# Patient Record
Sex: Female | Born: 1975
Health system: Southern US, Community
[De-identification: ages and names within clinical notes are randomized; demographics above are authoritative.]

## PROBLEM LIST (undated history)

## (undated) DIAGNOSIS — J302 Other seasonal allergic rhinitis: Secondary | ICD-10-CM

## (undated) DIAGNOSIS — F32A Depression, unspecified: Secondary | ICD-10-CM

## (undated) DIAGNOSIS — K3580 Unspecified acute appendicitis: Secondary | ICD-10-CM

## (undated) DIAGNOSIS — K819 Cholecystitis, unspecified: Secondary | ICD-10-CM

## (undated) DIAGNOSIS — D649 Anemia, unspecified: Secondary | ICD-10-CM

## (undated) DIAGNOSIS — C801 Malignant (primary) neoplasm, unspecified: Secondary | ICD-10-CM

## (undated) DIAGNOSIS — T4145XA Adverse effect of unspecified anesthetic, initial encounter: Secondary | ICD-10-CM

## (undated) DIAGNOSIS — R51 Headache: Secondary | ICD-10-CM

## (undated) DIAGNOSIS — R928 Other abnormal and inconclusive findings on diagnostic imaging of breast: Secondary | ICD-10-CM

## (undated) DIAGNOSIS — R519 Headache, unspecified: Secondary | ICD-10-CM

## (undated) DIAGNOSIS — J189 Pneumonia, unspecified organism: Secondary | ICD-10-CM

## (undated) DIAGNOSIS — T8859XA Other complications of anesthesia, initial encounter: Secondary | ICD-10-CM

## (undated) DIAGNOSIS — H539 Unspecified visual disturbance: Secondary | ICD-10-CM

## (undated) DIAGNOSIS — Z9889 Other specified postprocedural states: Secondary | ICD-10-CM

## (undated) DIAGNOSIS — M84376A Stress fracture, unspecified foot, initial encounter for fracture: Secondary | ICD-10-CM

## (undated) DIAGNOSIS — F419 Anxiety disorder, unspecified: Secondary | ICD-10-CM

## (undated) DIAGNOSIS — R112 Nausea with vomiting, unspecified: Secondary | ICD-10-CM

## (undated) HISTORY — DX: Other abnormal and inconclusive findings on diagnostic imaging of breast: R92.8

## (undated) HISTORY — DX: Cholecystitis, unspecified: K81.9

## (undated) HISTORY — DX: Unspecified acute appendicitis: K35.80

## (undated) HISTORY — DX: Unspecified visual disturbance: H53.9

## (undated) HISTORY — DX: Anxiety disorder, unspecified: F41.9

## (undated) HISTORY — DX: Anemia, unspecified: D64.9

## (undated) HISTORY — DX: Depression, unspecified: F32.A

## (undated) HISTORY — PX: CHOLECYSTECTOMY: SHX55

## (undated) HISTORY — PX: APPENDECTOMY: SHX54

## (undated) HISTORY — DX: Stress fracture, unspecified foot, initial encounter for fracture: M84.376A

## (undated) HISTORY — PX: EYE SURGERY: SHX253

## (undated) HISTORY — PX: WISDOM TOOTH EXTRACTION: SHX21

---

## 1998-11-15 HISTORY — PX: OTHER SURGICAL HISTORY: SHX169

## 2002-02-07 ENCOUNTER — Emergency Department (HOSPITAL_COMMUNITY): Admission: EM | Admit: 2002-02-07 | Discharge: 2002-02-07 | Payer: Self-pay | Admitting: Emergency Medicine

## 2002-07-20 ENCOUNTER — Other Ambulatory Visit: Admission: RE | Admit: 2002-07-20 | Discharge: 2002-07-20 | Payer: Self-pay | Admitting: Obstetrics and Gynecology

## 2002-09-18 ENCOUNTER — Encounter: Payer: Self-pay | Admitting: Obstetrics and Gynecology

## 2002-09-18 ENCOUNTER — Ambulatory Visit (HOSPITAL_COMMUNITY): Admission: RE | Admit: 2002-09-18 | Discharge: 2002-09-18 | Payer: Self-pay | Admitting: Obstetrics and Gynecology

## 2002-11-13 ENCOUNTER — Inpatient Hospital Stay (HOSPITAL_COMMUNITY): Admission: AD | Admit: 2002-11-13 | Discharge: 2002-11-13 | Payer: Self-pay | Admitting: Obstetrics and Gynecology

## 2003-01-31 ENCOUNTER — Observation Stay (HOSPITAL_COMMUNITY): Admission: AD | Admit: 2003-01-31 | Discharge: 2003-01-31 | Payer: Self-pay | Admitting: Obstetrics and Gynecology

## 2003-02-01 ENCOUNTER — Inpatient Hospital Stay (HOSPITAL_COMMUNITY): Admission: AD | Admit: 2003-02-01 | Discharge: 2003-02-04 | Payer: Self-pay | Admitting: Obstetrics and Gynecology

## 2003-02-05 ENCOUNTER — Encounter: Admission: RE | Admit: 2003-02-05 | Discharge: 2003-03-07 | Payer: Self-pay | Admitting: Obstetrics and Gynecology

## 2003-02-26 ENCOUNTER — Encounter: Payer: Self-pay | Admitting: Emergency Medicine

## 2003-02-26 ENCOUNTER — Emergency Department (HOSPITAL_COMMUNITY): Admission: EM | Admit: 2003-02-26 | Discharge: 2003-02-26 | Payer: Self-pay | Admitting: Emergency Medicine

## 2003-07-23 ENCOUNTER — Encounter: Payer: Self-pay | Admitting: Emergency Medicine

## 2003-07-23 ENCOUNTER — Emergency Department (HOSPITAL_COMMUNITY): Admission: EM | Admit: 2003-07-23 | Discharge: 2003-07-23 | Payer: Self-pay | Admitting: Emergency Medicine

## 2003-08-01 ENCOUNTER — Encounter: Payer: Self-pay | Admitting: Orthopedic Surgery

## 2003-08-01 ENCOUNTER — Ambulatory Visit (HOSPITAL_COMMUNITY): Admission: RE | Admit: 2003-08-01 | Discharge: 2003-08-01 | Payer: Self-pay | Admitting: Orthopedic Surgery

## 2003-08-29 ENCOUNTER — Ambulatory Visit (HOSPITAL_BASED_OUTPATIENT_CLINIC_OR_DEPARTMENT_OTHER): Admission: RE | Admit: 2003-08-29 | Discharge: 2003-08-29 | Payer: Self-pay | Admitting: Orthopedic Surgery

## 2003-09-24 ENCOUNTER — Other Ambulatory Visit: Admission: RE | Admit: 2003-09-24 | Discharge: 2003-09-24 | Payer: Self-pay | Admitting: Obstetrics and Gynecology

## 2003-11-16 HISTORY — PX: SHOULDER ARTHROSCOPY: SHX128

## 2004-09-21 ENCOUNTER — Ambulatory Visit: Payer: Self-pay | Admitting: Internal Medicine

## 2004-09-26 ENCOUNTER — Emergency Department (HOSPITAL_COMMUNITY): Admission: EM | Admit: 2004-09-26 | Discharge: 2004-09-26 | Payer: Self-pay | Admitting: Family Medicine

## 2004-12-11 ENCOUNTER — Ambulatory Visit: Payer: Self-pay | Admitting: Family Medicine

## 2005-01-27 ENCOUNTER — Ambulatory Visit: Payer: Self-pay | Admitting: Family Medicine

## 2005-04-08 ENCOUNTER — Other Ambulatory Visit: Admission: RE | Admit: 2005-04-08 | Discharge: 2005-04-08 | Payer: Self-pay | Admitting: Obstetrics and Gynecology

## 2005-05-29 ENCOUNTER — Emergency Department (HOSPITAL_COMMUNITY): Admission: EM | Admit: 2005-05-29 | Discharge: 2005-05-29 | Payer: Self-pay | Admitting: Family Medicine

## 2005-06-14 ENCOUNTER — Other Ambulatory Visit: Admission: RE | Admit: 2005-06-14 | Discharge: 2005-06-14 | Payer: Self-pay | Admitting: Obstetrics and Gynecology

## 2005-06-15 ENCOUNTER — Encounter: Admission: RE | Admit: 2005-06-15 | Discharge: 2005-06-15 | Payer: Self-pay | Admitting: Obstetrics and Gynecology

## 2005-11-15 HISTORY — PX: SHOULDER SURGERY: SHX246

## 2005-11-25 ENCOUNTER — Ambulatory Visit: Payer: Self-pay | Admitting: Family Medicine

## 2005-12-02 ENCOUNTER — Ambulatory Visit: Payer: Self-pay | Admitting: Family Medicine

## 2006-08-18 ENCOUNTER — Ambulatory Visit: Payer: Self-pay | Admitting: Family Medicine

## 2006-11-24 ENCOUNTER — Ambulatory Visit (HOSPITAL_BASED_OUTPATIENT_CLINIC_OR_DEPARTMENT_OTHER): Admission: RE | Admit: 2006-11-24 | Discharge: 2006-11-24 | Payer: Self-pay | Admitting: Orthopedic Surgery

## 2006-12-27 ENCOUNTER — Encounter: Admission: RE | Admit: 2006-12-27 | Discharge: 2007-02-24 | Payer: Self-pay | Admitting: Orthopedic Surgery

## 2008-11-15 DIAGNOSIS — J189 Pneumonia, unspecified organism: Secondary | ICD-10-CM

## 2008-11-15 HISTORY — DX: Pneumonia, unspecified organism: J18.9

## 2009-02-25 ENCOUNTER — Ambulatory Visit: Payer: Self-pay | Admitting: Family Medicine

## 2009-02-25 DIAGNOSIS — J309 Allergic rhinitis, unspecified: Secondary | ICD-10-CM | POA: Insufficient documentation

## 2009-02-25 DIAGNOSIS — F4320 Adjustment disorder, unspecified: Secondary | ICD-10-CM | POA: Insufficient documentation

## 2009-04-08 ENCOUNTER — Ambulatory Visit: Payer: Self-pay | Admitting: Family Medicine

## 2009-04-08 DIAGNOSIS — M84376A Stress fracture, unspecified foot, initial encounter for fracture: Secondary | ICD-10-CM

## 2009-04-08 HISTORY — DX: Stress fracture, unspecified foot, initial encounter for fracture: M84.376A

## 2009-04-12 ENCOUNTER — Emergency Department (HOSPITAL_COMMUNITY): Admission: EM | Admit: 2009-04-12 | Discharge: 2009-04-12 | Payer: Self-pay | Admitting: Emergency Medicine

## 2009-04-13 ENCOUNTER — Ambulatory Visit: Payer: Self-pay | Admitting: Gastroenterology

## 2009-04-13 ENCOUNTER — Inpatient Hospital Stay (HOSPITAL_COMMUNITY): Admission: EM | Admit: 2009-04-13 | Discharge: 2009-04-14 | Payer: Self-pay | Admitting: Emergency Medicine

## 2009-04-18 ENCOUNTER — Telehealth: Payer: Self-pay | Admitting: Nurse Practitioner

## 2009-04-28 ENCOUNTER — Ambulatory Visit: Payer: Self-pay | Admitting: Gastroenterology

## 2009-04-28 DIAGNOSIS — R1084 Generalized abdominal pain: Secondary | ICD-10-CM | POA: Insufficient documentation

## 2009-04-28 DIAGNOSIS — R109 Unspecified abdominal pain: Secondary | ICD-10-CM | POA: Insufficient documentation

## 2009-04-28 DIAGNOSIS — D649 Anemia, unspecified: Secondary | ICD-10-CM | POA: Insufficient documentation

## 2009-04-28 DIAGNOSIS — R197 Diarrhea, unspecified: Secondary | ICD-10-CM | POA: Insufficient documentation

## 2009-05-01 ENCOUNTER — Telehealth: Payer: Self-pay | Admitting: Nurse Practitioner

## 2009-05-02 ENCOUNTER — Ambulatory Visit (HOSPITAL_COMMUNITY): Admission: RE | Admit: 2009-05-02 | Discharge: 2009-05-02 | Payer: Self-pay | Admitting: Gastroenterology

## 2009-05-02 ENCOUNTER — Ambulatory Visit: Payer: Self-pay | Admitting: Gastroenterology

## 2009-05-02 DIAGNOSIS — R1011 Right upper quadrant pain: Secondary | ICD-10-CM | POA: Insufficient documentation

## 2009-05-02 DIAGNOSIS — R112 Nausea with vomiting, unspecified: Secondary | ICD-10-CM | POA: Insufficient documentation

## 2009-05-02 LAB — CONVERTED CEMR LAB
ALT: 13 units/L (ref 0–35)
AST: 18 units/L (ref 0–37)
Basophils Relative: 0.6 % (ref 0.0–3.0)
Bilirubin, Direct: 0.2 mg/dL (ref 0.0–0.3)
Eosinophils Relative: 4.8 % (ref 0.0–5.0)
HCT: 38.1 % (ref 36.0–46.0)
Lymphs Abs: 1.8 10*3/uL (ref 0.7–4.0)
MCV: 80.5 fL (ref 78.0–100.0)
Monocytes Absolute: 0.4 10*3/uL (ref 0.1–1.0)
Neutrophils Relative %: 60.5 % (ref 43.0–77.0)
RBC: 4.74 M/uL (ref 3.87–5.11)
Total Protein: 8.1 g/dL (ref 6.0–8.3)
WBC: 6.4 10*3/uL (ref 4.5–10.5)

## 2009-05-09 ENCOUNTER — Encounter: Payer: Self-pay | Admitting: Gastroenterology

## 2009-05-12 ENCOUNTER — Encounter (INDEPENDENT_AMBULATORY_CARE_PROVIDER_SITE_OTHER): Payer: Self-pay | Admitting: *Deleted

## 2009-05-12 ENCOUNTER — Ambulatory Visit (HOSPITAL_COMMUNITY): Admission: RE | Admit: 2009-05-12 | Discharge: 2009-05-12 | Payer: Self-pay | Admitting: Surgery

## 2009-05-12 ENCOUNTER — Encounter (INDEPENDENT_AMBULATORY_CARE_PROVIDER_SITE_OTHER): Payer: Self-pay | Admitting: Surgery

## 2009-05-27 ENCOUNTER — Encounter: Payer: Self-pay | Admitting: Gastroenterology

## 2009-06-04 ENCOUNTER — Telehealth: Payer: Self-pay | Admitting: Family Medicine

## 2009-10-28 ENCOUNTER — Ambulatory Visit: Payer: Self-pay | Admitting: Family Medicine

## 2009-10-28 DIAGNOSIS — R05 Cough: Secondary | ICD-10-CM

## 2009-10-28 DIAGNOSIS — F988 Other specified behavioral and emotional disorders with onset usually occurring in childhood and adolescence: Secondary | ICD-10-CM | POA: Insufficient documentation

## 2009-10-28 DIAGNOSIS — J029 Acute pharyngitis, unspecified: Secondary | ICD-10-CM | POA: Insufficient documentation

## 2009-10-28 DIAGNOSIS — R059 Cough, unspecified: Secondary | ICD-10-CM | POA: Insufficient documentation

## 2010-11-05 IMAGING — CT CT ABDOMEN W/ CM
2 of 4 series · 17 of 46 positions shown, 19 images · IV contrast (omniscan)
Comparison: None

CT ABDOMEN

CLINICAL DATA: Right lower quadrant and periumbilical abdominal
pain.  Nausea and diarrhea.

CT ABDOMEN AND PELVIS WITH CONTRAST
TECHNIQUE: Multidetector CT imaging of the abdomen and pelvis was
performed using the standard protocol following bolus
administration of intravenous contrast.
Contrast: 100 ml Omniscan 300 IV contrast

[Series 2: routine abdomen · axial · 0.70mm/px · z∈[-445,-50]mm · 14 of 87 slices shown, 16 images]
[im 4/87  soft-tissue]
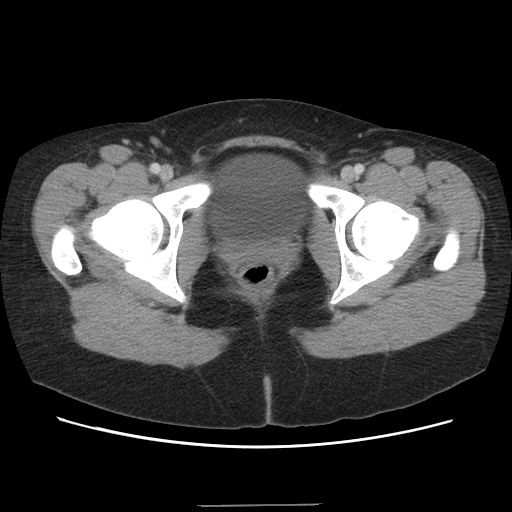
[im 4/87  bone]
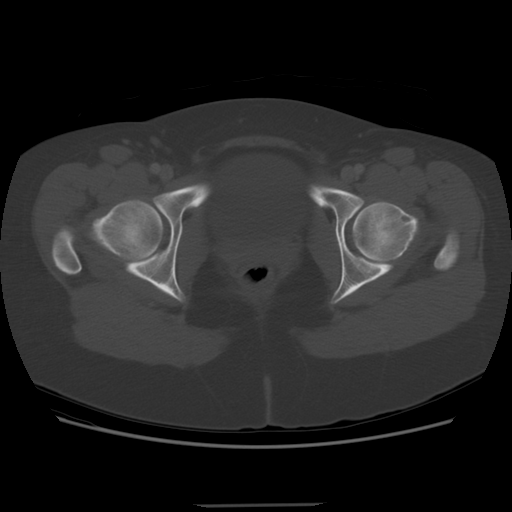
[im 11/87  soft-tissue]
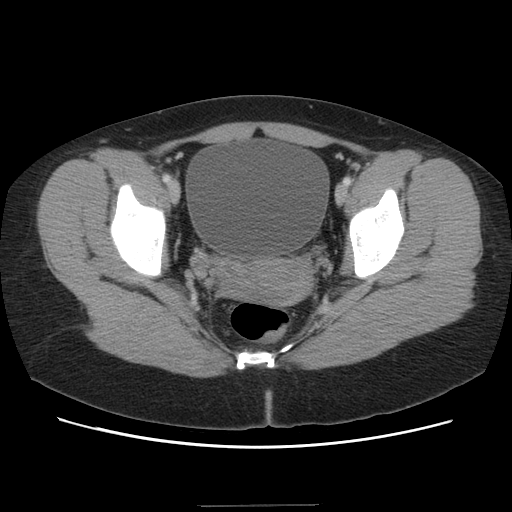
[im 18/87  soft-tissue]
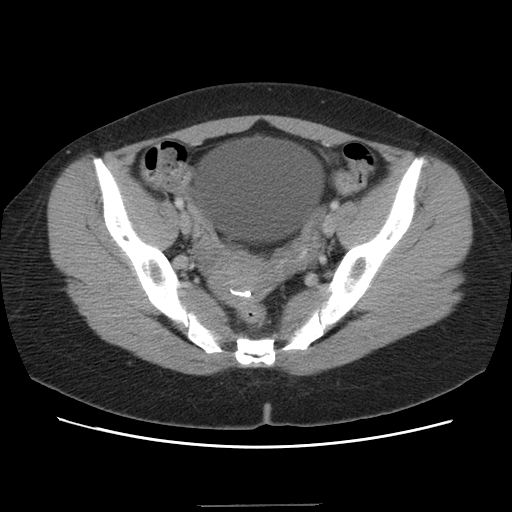
[im 25/87  soft-tissue]
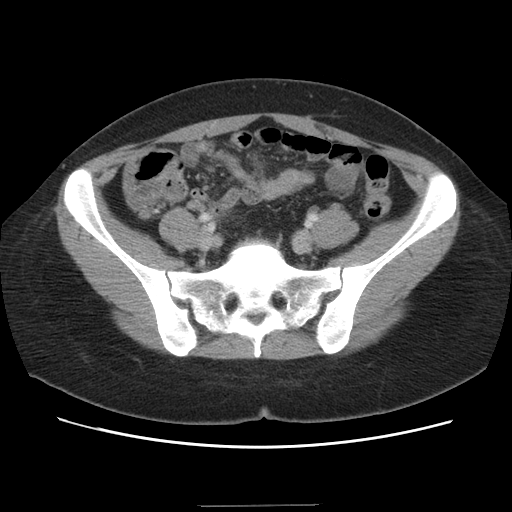
[im 28/87  soft-tissue]
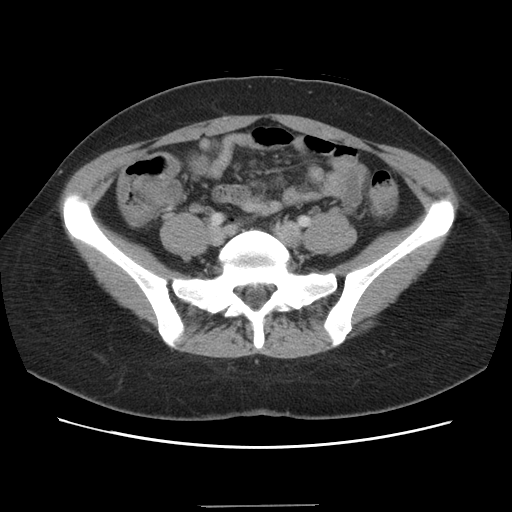
[im 35/87  soft-tissue]
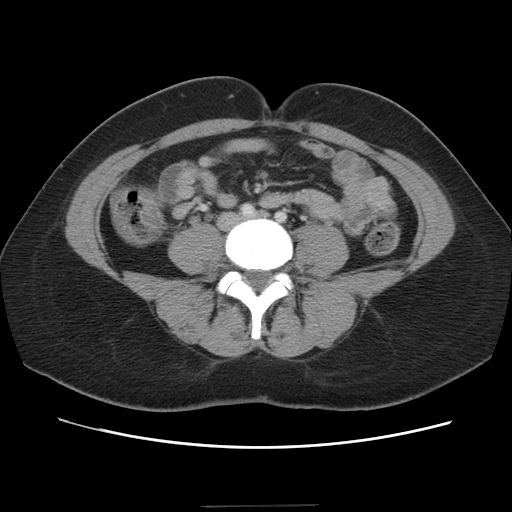
[im 42/87  soft-tissue]
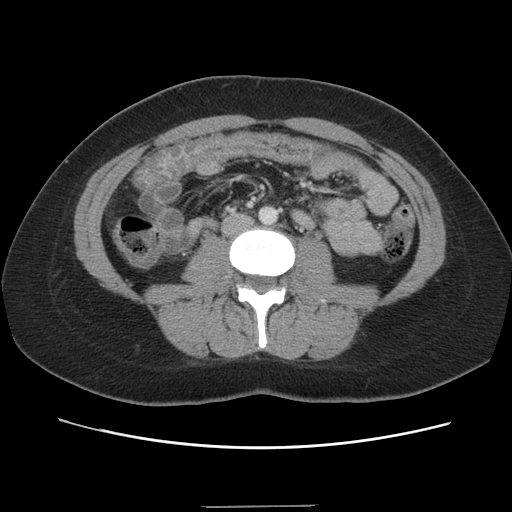
[im 45/87  soft-tissue]
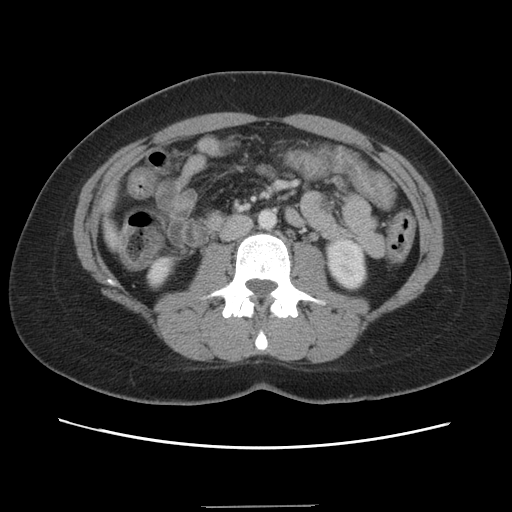
[im 52/87  soft-tissue]
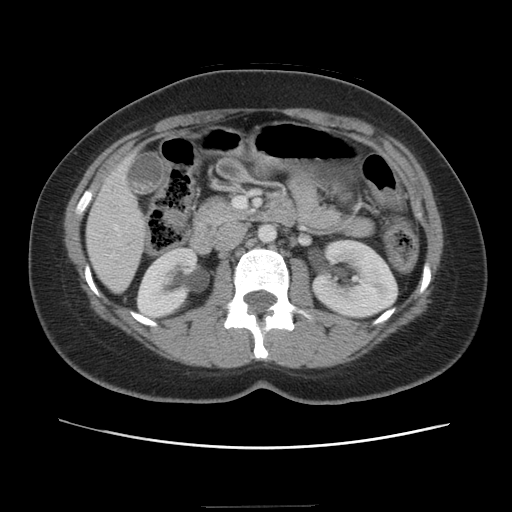
[im 52/87  bone]
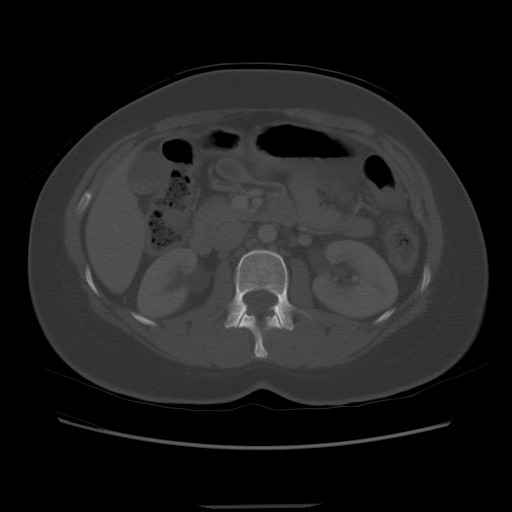
[im 59/87  soft-tissue]
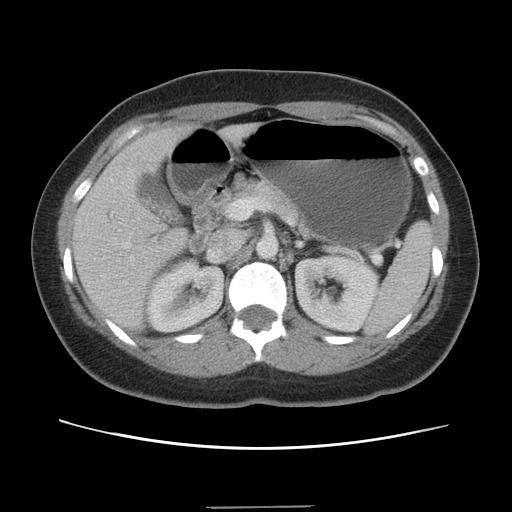
[im 66/87  soft-tissue]
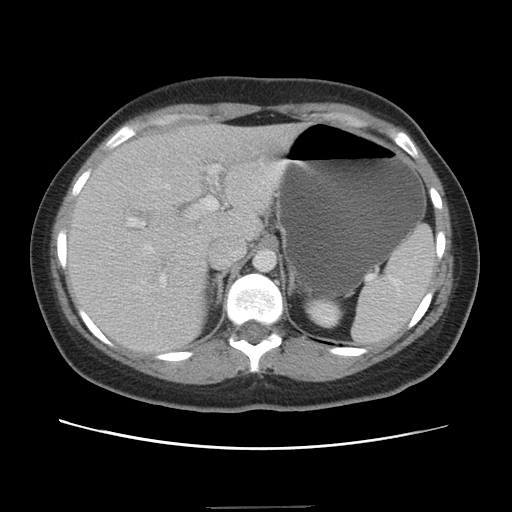
[im 69/87  soft-tissue]
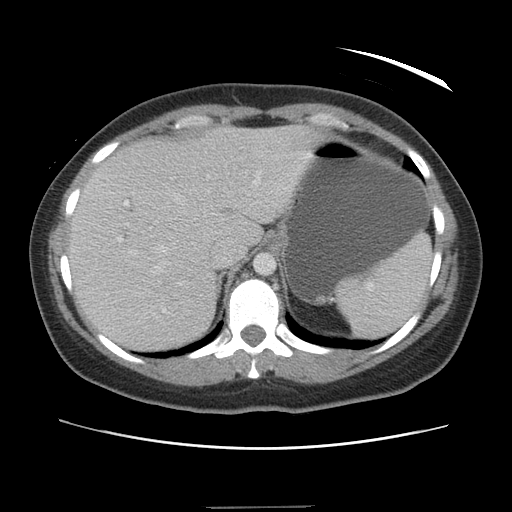
[im 76/87  soft-tissue]
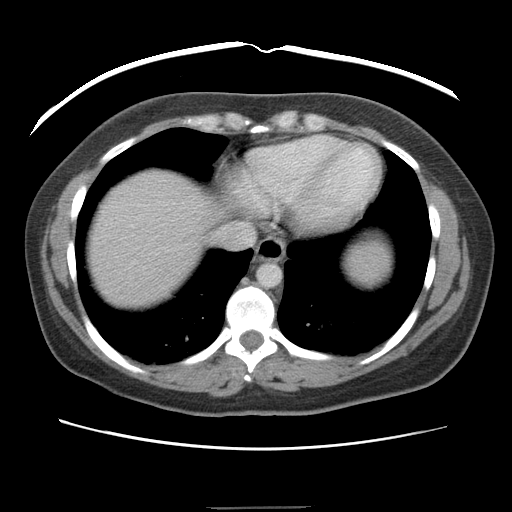
[im 83/87  soft-tissue]
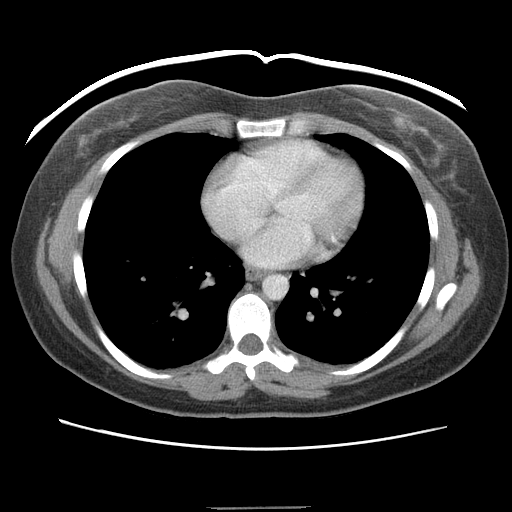

[Series 401: cor · coronal · 1.02mm/px · 3 of 133 slices shown]
[im 45/133  soft-tissue]
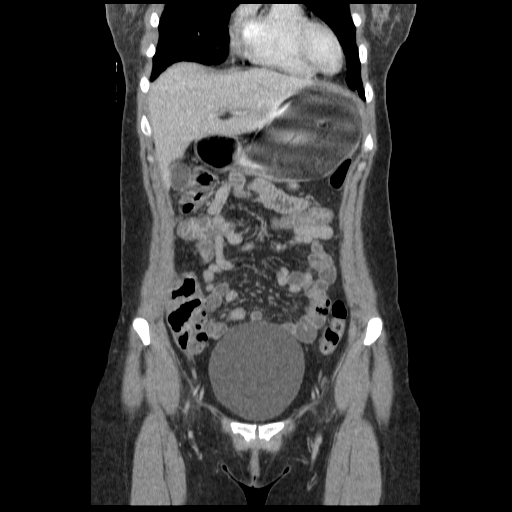
[im 59/133  soft-tissue]
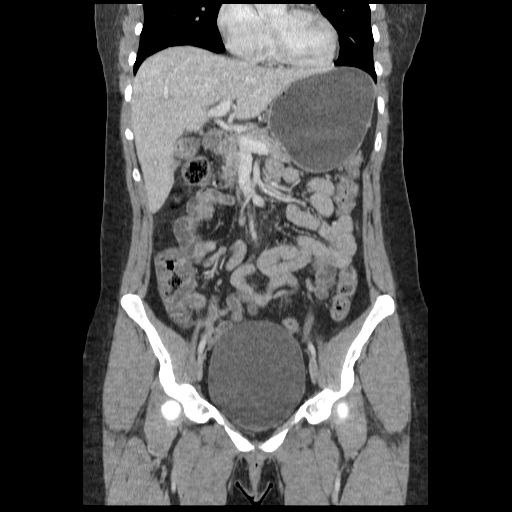
[im 74/133  soft-tissue]
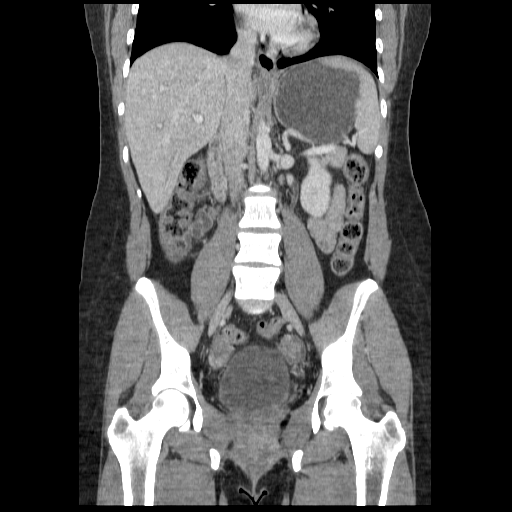

[17 of 46 positions shown; findings below may reference images not displayed]

FINDINGS: Gallstones are present.  Liver, kidneys, adrenal glands,
spleen, and pancreas are unremarkable.  No free fluid or
lymphadenopathy.  No free air.
IMPRESSION: No acute intra-abdominal finding.  Gallstones.

Please see CT pelvis report below.

CT PELVIS
FINDINGS: There is circumferential wall thickening of the
ascending and transverse colon.  No pericolonic fluid collection is
identified.  No free fluid.  Uterus and ovaries are normal; an IUD
is in place.  Bladder is physiologically distended.  No acute
osseous abnormality. The appendix is normal.
IMPRESSION: Ascending and transverse colonic wall thickening; primary
differential considerations include colitis or inflammatory bowel
disease.

## 2010-12-07 ENCOUNTER — Encounter: Payer: Self-pay | Admitting: Gastroenterology

## 2011-02-22 LAB — COMPREHENSIVE METABOLIC PANEL
AST: 17 U/L (ref 0–37)
Albumin: 4.3 g/dL (ref 3.5–5.2)
Calcium: 9.7 mg/dL (ref 8.4–10.5)
Chloride: 104 mEq/L (ref 96–112)
Creatinine, Ser: 0.91 mg/dL (ref 0.4–1.2)
GFR calc Af Amer: >60 mL/min (ref >60)
Total Bilirubin: 0.5 mg/dL (ref 0.3–1.2)
Total Protein: 7.7 g/dL (ref 6.0–8.3)

## 2011-02-22 LAB — DIFFERENTIAL
Eosinophils Relative: 5 % (ref 0–5)
Lymphocytes Relative: 24 % (ref 12–46)
Lymphs Abs: 2.1 10*3/uL (ref 0.7–4.0)
Monocytes Absolute: 0.7 10*3/uL (ref 0.1–1.0)
Monocytes Relative: 8 % (ref 3–12)
Neutro Abs: 5.3 10*3/uL (ref 1.7–7.7)

## 2011-02-22 LAB — CBC
MCV: 82 fL (ref 78.0–100.0)
Platelets: 219 10*3/uL (ref 150–400)
RDW: 18.1 % — ABNORMAL HIGH (ref 11.5–15.5)
WBC: 8.5 10*3/uL (ref 4.0–10.5)

## 2011-02-23 LAB — POCT URINALYSIS DIP (DEVICE)
Bilirubin Urine: NEGATIVE
Glucose, UA: NEGATIVE mg/dL
Specific Gravity, Urine: 1.02 (ref 1.005–1.030)
pH: 6.5 (ref 5.0–8.0)

## 2011-02-23 LAB — CBC
HCT: 39.2 % (ref 36.0–46.0)
Hemoglobin: 13 g/dL (ref 12.0–15.0)
MCHC: 33.5 g/dL (ref 30.0–36.0)
MCV: 79.7 fL (ref 78.0–100.0)
Platelets: 159 10*3/uL (ref 150–400)
RBC: 3.91 MIL/uL (ref 3.87–5.11)
RDW: 17.1 % — ABNORMAL HIGH (ref 11.5–15.5)
WBC: 4.8 10*3/uL (ref 4.0–10.5)
WBC: 8 10*3/uL (ref 4.0–10.5)

## 2011-02-23 LAB — DIFFERENTIAL
Eosinophils Absolute: 0.1 10*3/uL (ref 0.0–0.7)
Eosinophils Absolute: 0.3 10*3/uL (ref 0.0–0.7)
Eosinophils Relative: 2 % (ref 0–5)
Eosinophils Relative: 5 % (ref 0–5)
Lymphocytes Relative: 12 % (ref 12–46)
Lymphs Abs: 0.9 10*3/uL (ref 0.7–4.0)
Lymphs Abs: 1.7 10*3/uL (ref 0.7–4.0)
Monocytes Absolute: 0.5 10*3/uL (ref 0.1–1.0)
Monocytes Relative: 13 % — ABNORMAL HIGH (ref 3–12)
Neutrophils Relative %: 45 % (ref 43–77)

## 2011-02-23 LAB — CULTURE, BLOOD (ROUTINE X 2): Culture: NO GROWTH

## 2011-02-23 LAB — BASIC METABOLIC PANEL
BUN: 8 mg/dL (ref 6–23)
CO2: 28 mEq/L (ref 19–32)
Calcium: 8.4 mg/dL (ref 8.4–10.5)
Chloride: 105 mEq/L (ref 96–112)
Creatinine, Ser: 0.69 mg/dL (ref 0.4–1.2)
GFR calc Af Amer: >60 mL/min (ref >60)
GFR calc non Af Amer: >60 mL/min (ref >60)
GFR calc non Af Amer: >60 mL/min (ref >60)
Glucose, Bld: 84 mg/dL (ref 70–99)
Potassium: 3.6 mEq/L (ref 3.5–5.1)
Sodium: 138 mEq/L (ref 135–145)
Sodium: 144 mEq/L (ref 135–145)

## 2011-02-23 LAB — POCT PREGNANCY, URINE: Preg Test, Ur: NEGATIVE

## 2011-02-23 LAB — URINALYSIS, ROUTINE W REFLEX MICROSCOPIC
Bilirubin Urine: NEGATIVE
Glucose, UA: NEGATIVE mg/dL
Ketones, ur: 15 mg/dL — AB
Protein, ur: NEGATIVE mg/dL
pH: 6.5 (ref 5.0–8.0)

## 2011-02-23 LAB — LACTIC ACID, PLASMA: Lactic Acid, Venous: 0.6 mmol/L (ref 0.5–2.2)

## 2011-02-23 LAB — URINE MICROSCOPIC-ADD ON

## 2011-02-23 LAB — PHOSPHORUS: Phosphorus: 2.2 mg/dL — ABNORMAL LOW (ref 2.3–4.6)

## 2011-02-23 LAB — GLUCOSE, CAPILLARY: Glucose-Capillary: 76 mg/dL (ref 70–99)

## 2011-02-23 LAB — LIPASE, BLOOD: Lipase: 21 U/L (ref 11–59)

## 2011-03-04 ENCOUNTER — Ambulatory Visit (INDEPENDENT_AMBULATORY_CARE_PROVIDER_SITE_OTHER): Payer: 59 | Admitting: Family Medicine

## 2011-03-04 ENCOUNTER — Encounter: Payer: Self-pay | Admitting: Family Medicine

## 2011-03-04 VITALS — BP 120/78 | Temp 98.2°F | Ht 66.0 in | Wt 180.0 lb

## 2011-03-04 DIAGNOSIS — J019 Acute sinusitis, unspecified: Secondary | ICD-10-CM

## 2011-03-04 DIAGNOSIS — R05 Cough: Secondary | ICD-10-CM

## 2011-03-04 DIAGNOSIS — R059 Cough, unspecified: Secondary | ICD-10-CM

## 2011-03-04 MED ORDER — AZITHROMYCIN 250 MG PO TABS: 250.0000 mg | ORAL_TABLET | Freq: Every day | ORAL | Status: DC

## 2011-03-04 NOTE — Progress Notes (Signed)
  Subjective:    Patient ID: Emma Mathews, female    DOB: [birth year], 35 y.o.   MRN: [id number]  HPI Patient seen with onset of illness about 9 days ago. She had initially cold-like symptoms. Initially no cough. Now presents with productive cough and increasing sinus pressure. Feels much worse than she did a week ago. Laryngitis symptoms off and on. Some chills and body aches. No definite fever. Nonsmoker.   Review of Systems As per HPI.    Objective:   Physical Exam  Constitutional: She appears well-developed and well-nourished.  HENT:  Right Ear: External ear normal.  Left Ear: External ear normal.  Mouth/Throat: Oropharynx is clear and moist. No oropharyngeal exudate.  Neck: No thyromegaly present.  Cardiovascular: Normal rate, regular rhythm and normal heart sounds.   No murmur heard. Pulmonary/Chest: Effort normal and breath sounds normal. No respiratory distress. She has no wheezes. She has no rales.  Lymphadenopathy:    She has no cervical adenopathy.  Skin: No rash noted.          Assessment & Plan:  Acute sinusitis. Zithromax for 5 days. Continue Mucinex. Followup as needed

## 2011-03-30 NOTE — Op Note (Signed)
Emma Mathews, Emma Mathews            ACCOUNT NO.:  [id number]   MEDICAL RECORD NO.:  [id number]          PATIENT TYPE:  AMB   LOCATION:  DAY                          FACILITY:  [facility]   PHYSICIAN:  Wilmon Arms. Corliss Skains, M.D. DATE OF BIRTH:  1975/11/19   DATE OF PROCEDURE:  05/12/2009  DATE OF DISCHARGE:                               OPERATIVE REPORT   PREOPERATIVE DIAGNOSIS:  Chronic calculus cholecystitis.   POSTOPERATIVE DIAGNOSIS:  Chronic calculus cholecystitis.   PROCEDURES PERFORMED:  Laparoscopic cholecystectomy with intraoperative  cholangiogram.   SURGEON:  Gwyndolyn Kaufman, M.D.   ANESTHESIA:  General endotracheal.   INDICATIONS:  This is a 35 year old female with a 6-year history of  known gallstones.  Over the last couple of months, she has developed  intermittent severe right upper quadrant pain, nausea and vomiting and  diarrhea.  Her symptoms have improved, but she continues to have some  right upper quadrant pain.  Ultrasound showed cholelithiasis with no  sign of cholecystitis.  Liver function tests were unremarkable.  She  presents now for elective cholecystectomy.   DESCRIPTION OF PROCEDURE:  The patient was brought to the operating room  and placed in supine position on the operating table.  After an adequate  level of general anesthesia was obtained, the patient's abdomen was  prepped with Betadine and draped in a sterile fashion.  A time-out was  taken to assure the proper patient and proper procedure.  We infiltrated  the area below the umbilicus with 0.25% Marcaine with epinephrine.  A  transverse incision was made and dissection was carried down to the  fascia.  The fascia was opened vertically and the edges were grasped  with Kocher clamps.  We entered the peritoneal cavity bluntly.  A stay  suture of 0-Vicryl placed around the fascial opening.  The Hasson  cannula was inserted and secured with stay suture.  Pneumoperitoneum was  obtained by insufflating CO2  maintaining a maximum pressure of 15 mmHg.  The laparoscope was inserted.  The patient was positioned in reverse  Trendelenburg and tilted to her left.  An 11-mm port was placed in the  subxiphoid position.  Two 5 mm ports were placed in the right upper  quadrant.  The gallbladder did not appear grossly inflamed but was  minimally thickened.  The gallbladder was grasped with a clamp and  elevated over the edge of the liver.  We opened the peritoneum around  the hilum of the gallbladder.  We bluntly dissect around the cystic  duct.  It was ligated distally.  A small opening was created on the  cystic duct.  A Cook cholangiogram catheter was inserted through a stab  incision and threaded into the cystic duct.  It secured with a clip and  a cholangiogram was obtained.  This showed contrast flowing easily  proximally and distally into the biliary tree with no sign of filling  defect or obstruction.  Contrast flowed easily into the duodenum.  The  catheter was removed and the cystic duct was ligated with clips and  divided.  The cystic artery was ligated with clips and divided.  Cautery  was then used to remove the gallbladder from the liver.  The gallbladder  was placed in an EndoCatch sac.  We inspected the gallbladder fossa for  hemostasis.  We thoroughly irrigated.  The gallbladder and EndoCatch sac  were removed from the umbilical port site.  The fascia was closed with  the pursestring suture.  We again inspected the right upper quadrant for  hemostasis.  The trocars were then removed and pneumoperitoneum was released; 4-0  Monocryl was used to close the skin incisions.  Steri-Strips and clean  dressings were applied.  The patient was then extubated and brought to  recovery in stable condition.  All sponge, instrument and needle counts  were correct.      Wilmon Arms. Tsuei, M.D.  Electronically Signed     MKT/MEDQ  D:  05/12/2009  T:  05/12/2009  Job:  [id number]

## 2011-03-30 NOTE — H&P (Signed)
NAMEARSENIA, Mathews            ACCOUNT NO.:  [id number]   MEDICAL RECORD NO.:  [id number]          PATIENT TYPE:  INP   LOCATION:  2106                         FACILITY:  [facility]   PHYSICIAN:  Darryl D. Prime, MD    DATE OF BIRTH:  July 01, 1976   DATE OF ADMISSION:  04/12/2009  DATE OF DISCHARGE:                              HISTORY & PHYSICAL   Patient is full code.   PRIMARY CARE PHYSICIAN:  Tawny Asal, MD   HISTORY OF PRESENT ILLNESS:  [name] is a 35 year old female with no  significant past medical history.  She had an IUD placed.  She had right  shoulder surgery x2 for a torn labrum.  She had a left shoulder surgery  for impingement syndrome.  She presents with abdominal pain, starting  around 4 p.m. on Apr 11, 2009, gradual in onset, insidious right lower  quadrant, shock sensation with dullness at times, occasional crampiness  when it become very severe, sharp when palpated, a 7/10 associated with  significant nausea.  At midnight on Apr 12, 2009, she had diarrhea x6  episodes, nonbloody, no mucus.  Pain continued throughout the day and  she then noted the pain progressed to where she had to come to the  emergency room.  She came to the emergency room at around 10 p.m. on Apr 12, 2009.  She was given IV fluids, Zofran, and Dilaudid. On  transportation to the CT scanner, they bumped the bed and she had severe  pain.  The patient had nausea and vomiting after the CT was done,  nonbloody, nonbilious.  The patient, in the emergency room, says pain is  somewhat relieved with pain medications.  The patient denies any fever,  denies any cough or upper respiratory infection.  No recent antibiotic  use. No sick contacts with similar symptoms.   PAST MEDICAL AND SURGICAL HISTORY:  As above and history of depression.   ALLERGIES:  BENADRIL which causes hives.  CODEINE Causes nausea.   MEDICATIONS:  Lexapro 10 mg daily.   SOCIAL HISTORY:  No history of tobacco or  drugs.  Occasional wine.  She  is married.  She has a 34-year-old.   FAMILY HISTORY:  She is adopted and unaware of her family history.   REVIEW OF SYSTEMS:  A 14-point review of systems is negative unless  stated above.   PHYSICAL EXAMINATION:  VITAL SIGNS:  Temperature 99.1, blood pressure of  109/70, pulse of 86, respiratory rate of 16.  Oxygen saturation 99% on  room air.  GENERAL:  She is a female who looks her stated age, lying flat in bed in  no acute distress.  HEENT:  Normocephalic, atraumatic.  Pupils are equal, round and reactive  to light.  Extraocular movements being intact.  Oropharynx reveals no posterior oropharyngeal lesions.  NECK:  Supple, no lymphadenopathy or thyromegaly. No carotid bruits.  LUNGS:  Clear to auscultation bilaterally.  CARDIOVASCULAR:  Regular rhythm and rate with no  murmur, rub or gallop.  S1 and S2, no S3 or S4.  ABDOMEN: Soft, no distention.  She has mild tenderness  in the right  lower quadrant to palpation and slight rebound tenderness.  She notes a  sharp sensation when I press down and when I pull up.  Decreased bowel  sounds.  No signs of hepatosplenomegaly.  EXTREMITIES:  No clubbing, cyanosis, or edema.  LYMPHATICS:  No edema.  No anterior cervical lymphadenopathy.  SKIN:  No rash.  PSYCHIATRIC:  Appropriate mood and affect.  Patient is alert and  oriented x4.  NEUROLOGIC:  She is alert and oriented x4 with cranial nerves II through  XII grossly intact.  Strength and sensation grossly intact.  MUSCULOSKELETAL:  Full range of motion with normal joints, no signs of  synovitis.   LABORATORY DATA:  She has a white count of 8.0, hemoglobin 13,  hematocrit 39.2 with platelet count 159,000, segs 80%.  Sodium 138,  potassium 3.6, chloride 105, bicarb 24, BUN 8, creatinine 0.7, glucose  84, calcium 9.1, urine pregnancy test negative.  Urinalysis showed wbc's  7-10, bacteria few, rare yeast, small leukocyte esterase.  CT of the  abdomen  showed ascending and transverse colon wall thickening diffusely,  colitis versus inflammatory bowel syndrome.   ASSESSMENT AND PLAN:  This is a patient with no significant past medical  history related to her current presentation who has apparent severe  colitis, inflammatory bowel disease versus infectious process, bacterial  or otherwise.  At this time, she will be admitted to the step-down unit,  be given IV fluids.  Will give IV ciprofloxacin and p.o. Flagyl as there  are cases of community acquired C. difficile colitis.  She will be on  contact for this concern.  The patient did speak with the surgeon, Dr.  Lindie Spruce, and he will see her for concern of possible peritoneal signs.  Blood cultures will be ordered.  Will get a lipase study, C. difficile  of stools x2, Hemoccult stool, stool cultures, ova and parasites of  stool.  She will be held n.p.o. for now.  For her abnormal urinalysis,  will check a urine culture.  Deep vein thrombosis and gastrointestinal  prophylaxis will be ordered.      Darryl D. Prime, MD  Electronically Signed     DDP/MEDQ  D:  04/13/2009  T:  04/13/2009  Job:  [id number]

## 2011-03-30 NOTE — Consult Note (Signed)
Emma Mathews, Emma Mathews NO.:  [id number]   MEDICAL RECORD NO.:  [id number]          PATIENT TYPE:  INP   LOCATION:  5013                         FACILITY:  [facility]   PHYSICIAN:  Cherylynn Ridges, M.D.    DATE OF BIRTH:  1975-11-29   DATE OF CONSULTATION:  DATE OF DISCHARGE:                                 CONSULTATION   Dear Dr. Oralia Rud,   Thank you for asking me to see this otherwise healthy 35 year old  gravida 1, para 1-0-0-1 with abdominal pain since Friday evening.  It  started off with abdominal pain and progressed to where she had pain  plus cramping and diarrhea that persisted intermittently throughout  Saturday.  Last diarrheal bowel movement was at 6 o'clock yesterday.  However, she did take some Imodium late at night.  She came in today  with severe abdominal pain and CT scan demonstrated right colon and  transverse colon colitis.  She has no free air.  No evidence of  perforation.  No abscesses.   Past medical history is unremarkable.  She takes Lexapro, and has an  implantable hormonal IUD.   She does not tolerate CODEINE, and is allergic to BENADRYL.   The only surgery has been on her right shoulder.  She has had ho  previous abdominal procedures.  She is otherwise healthy.   On exam, she is afebrile.  Other vital signs are stable.  She is  normocephalic, atraumatic, and anicteric.  Neck is supple.  Chest is  clear.  Abdomen is only slightly if at all distended, but with  hypoactive bowel sounds.  She is tender and guards voluntarily in the  right lower quadrant with no rebound.  Rectal exam, no blood.   Her electrolytes are within normal limits.  Her carbon dioxide is 24,  BUN is 8, and creatinine 0.7.  Her white blood cell count is 8 and  hemoglobin 13.  She has no free air on CT scan.  She has a thickened  edematous right colon and transverse colon.   IMPRESSION:  Colitis, unknown etiology.  There is a type of community  acquired Clostridium  difficile colitis that you must be aware of.  Treatment with Flagyl would be appropriate in addition to ciprofloxacin.  She otherwise has some type of gastroenteritis or colitis, and she  should not require surgery at this point.  We will see at her least one  more time during her admission to make sure she is not getting worse,  but with a normal white count, no high fever, and no signs of sepsis,  the likelihood of requiring surgery is minimal.      Cherylynn Ridges, M.D.  Electronically Signed     JOW/MEDQ  D:  04/13/2009  T:  04/13/2009  Job:  [id number]

## 2011-03-30 NOTE — Discharge Summary (Signed)
Emma Mathews, KUEKER NO.:  [id number]   MEDICAL RECORD NO.:  [id number]          PATIENT TYPE:  INP   LOCATION:  5013                         FACILITY:  [facility]   PHYSICIAN:  Stacie Glaze, MD    DATE OF BIRTH:  1976/04/28   DATE OF ADMISSION:  04/12/2009  DATE OF DISCHARGE:  04/14/2009                               DISCHARGE SUMMARY   HOSPITAL COURSE:  The patient is a pleasant 35 year old white female who  was admitted for probable infectious colitis was admitted to the  internal medicine service because of intractable nausea, vomiting,  abdominal pain.  CT scan revealed colitis and a GI consult was  consistent with acute colitis.   She had no diarrhea since her admission after beginning Cipro and  Flagyl.  So she did not have stool studies to differentiate between this  being C. diff colitis and infectious colitis.  GI recommended that she  be discharged both on Flagyl and Cipro for 7 additional days and follow-  up with GI in 1 week.  They also recommended a low residue diet.   This diagnosis was discussed with the patient and the plan was discussed  with the patient and she decided that it would be more appropriate for  her to be able go home.   Cipro 500 mg p.o. b.i.d. was sent to her pharmacy, as well as Flagyl 5  mg q.i.d.  She will take that for 7 additional days after discharge for  a total of 10 days.   She will follow a low residue diet.  She should follow-up with GI in 1-2  weeks.  The patient expressed understanding.  Prescriptions called into  pharmacy   DISCHARGE DIAGNOSIS:  Infectious colitis.   DISCHARGE CONDITION:  Improved.      Stacie Glaze, MD  Electronically Signed     JEJ/MEDQ  D:  04/14/2009  T:  04/14/2009  Job:  [id number]

## 2011-04-02 NOTE — Op Note (Signed)
Emma, Mathews            ACCOUNT NO.:  [id number]   MEDICAL RECORD NO.:  [id number]          PATIENT TYPE:  AMB   LOCATION:  DSC                          FACILITY:  [facility]   PHYSICIAN:  Nadara Mustard, MD     DATE OF BIRTH:  05-24-1976   DATE OF PROCEDURE:  11/24/2006  DATE OF DISCHARGE:                               OPERATIVE REPORT   PREOPERATIVE DIAGNOSES:  1. Impingement syndrome, right shoulder.  2. Rotator cuff tear, right shoulder.  3. SLAP lesion, right shoulder.  4. Subscapularis adhesions, right shoulder.   POSTOPERATIVE DIAGNOSES:  1. Impingement syndrome, right shoulder.  2. Rotator cuff tear, right shoulder.  3. SLAP lesion, right shoulder.  4. Subscapularis adhesions, right shoulder.   PROCEDURE:  1. Debridement of subscapularis adhesions, debridement of SLAP tear      and debridement rotator cuff tear.  2. Subacromial decompression.   SURGEON:  Nadara Mustard, MD   ANESTHESIA:  General plus interscalene block.   ESTIMATED BLOOD LOSS:  Minimal.   ANTIBIOTICS:  None.   DRAINS:  None.   COMPLICATIONS:  None.   DISPOSITION:  To PACU in stable condition.   INDICATIONS FOR PROCEDURE:  The patient is a 35 year old woman status  post an open Bankart repair to her right shoulder.  She has had  persistent pain, pain with activities of daily living, pain with Near  and Hawkins impingement test, and presents at this time for arthroscopic  intervention.  Risks and benefits were discussed including infection,  neurovascular injury, persistent pain, need for additional surgery.  The  patient states she understands and wished proceed at this time.   DESCRIPTION OF PROCEDURE:  The patient was brought to OR room 1 after  undergoing interscalene block.  She then underwent general anesthetic.  After adequate level of anesthesia obtained, the patient was placed in  the beach-chair position and the right upper extremity was prepped using  DuraPrep and draped  into a sterile field.  The scope was inserted from  the posterior portal, anterior portal was established from an outside-in  technique.  Visualization showed a partial tearing of the rotator cuff  but she did have a good attachment of rotator cuff.  She had partial  tearing of the biceps tendon, adhesions to the subscapularis as well as  partial tearing of subscapularis and a large SLAP lesion.  The shaver  was inserted from the anterior portal and debridement of the biceps  tear, debridement of subscapularis tear, debridement of subscapularis  adhesions and debridement rotator cuff tear were performed and the vapor  Mitek wand was used for further debridement.  The instruments were then  removed and the scope was inserted from the posterior portal in the  subacromial space and a lateral portal was established.  Visualization  showed a significant amount of bursitis and a hook type 3 acromion.  The  patient underwent bursectomy as well as subacromial decompression.  The  vapor wand was used for hemostasis.  After decompression was performed,  the instruments were removed.  The portals were closed using 4-0 nylon.  The wounds were  covered Adaptic, orthopedic sponges, ABD dressing and  Hypafix tape.  The patient was placed in a sling, extubated and taken to  PACU in stable condition.      Nadara Mustard, MD  Electronically Signed     MVD/MEDQ  D:  11/24/2006  T:  11/25/2006  Job:  [id number]
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
## 2011-04-02 NOTE — Op Note (Signed)
Emma Mathews, Emma Mathews                      ACCOUNT NO.:  [id number]   MEDICAL RECORD NO.:  [id number]                   PATIENT TYPE:  AMB   LOCATION:  DSC                                  FACILITY:  [facility]   PHYSICIAN:  Nadara Mustard, M.D.                DATE OF BIRTH:  February 25, 1976   DATE OF PROCEDURE:  08/29/2003  DATE OF DISCHARGE:                                 OPERATIVE REPORT   PREOPERATIVE DIAGNOSIS:  Impingement syndrome, left shoulder.   POSTOPERATIVE DIAGNOSES:  1. Partial rotator cuff tear, left shoulder.  2. Grade 1 superior labrum anterior and posterior lesion (SLAP), left     shoulder.  3. Impingement syndrome with hook type 3 acromion.   PROCEDURE:  1. Left shoulder arthroscopy with debridement of rotator cuff tear and     debridement of SLAP lesion.  2. Subacromial decompression.   SURGEON:  Nadara Mustard, M.D.   ANESTHESIA:  General plus interscalene block.   ESTIMATED BLOOD LOSS:  Minimal.   ANTIBIOTICS:  None.   DISPOSITION:  To PACU in stable condition.   INDICATIONS FOR THE PROCEDURE:  The patient is a 35 year old woman with  chronic impingement symptoms in her left shoulder.  The patient is status  post a right shoulder surgery for impingement syndrome and has recovered  well from her right shoulder and has been having increasing symptoms with  her left shoulder.  She has been unable to perform activities of daily  living.  She has failed conservative care, and she presents at this time for  arthroscopic intervention.  The risks and benefits were discussed including  infection, neurovascular injury, persistent pain, need for additional  surgery.  The patient stated she understood and wished to proceed at the  time.   DESCRIPTION OF PROCEDURE:  The patient was brought to OR room 5 after  undergoing a interscalene block.  She then underwent general endotracheal  anesthetic.  After an adequate level of anesthesia was obtained, the patient  was  placed in the beach chair position and her left upper extremity was  prepped using Duraprep and draped into a sterile field.  The scope was  inserted from the posterior portal into the left shoulder, and an anterior  portal was established from the outside-in technique, localized with an 18-  gauge spinal.  Visualization showed the patient to have synovitis and a  partial tearing of the rotator cuff as well as a grade 1 SLAP lesion.  There  was also a very mottled appearance to the glenoid articular cartilage, but  osteochondral defects, no softness in the cartilage.  The shaver and VAPR  Mitek instruments were used to debride the partial rotator cuff tear and to  debride the SLAP lesion.  Hemostasis was obtained, and the instruments were  removed.  The scope was then inserted from the posterior portal into the  subacromial space and a lateral portal was  established.  The VAPR was used  for a bursectomy.  The patient did have a large hook type 3 acromion, and  the acromionizer was used to perform a subacromial decompression.  The VAPR  Mitek was used for hemostasis.  The shaver was used for further debridement  of the bursa.  After decompression was performed, the instruments were  removed.  The portals were closed using 4-0 nylon.  The wounds were covered  with Adaptic orthopedic sponges, ABD dressing, and Hypafix tape.  The  patient was extubated, taken to the PACU in stable condition.   Plan for discharge to home.  Follow up in the office in two weeks.                                               Nadara Mustard, M.D.    MVD/MEDQ  D:  08/29/2003  T:  08/30/2003  Job:  [id number]

## 2011-07-01 ENCOUNTER — Ambulatory Visit (INDEPENDENT_AMBULATORY_CARE_PROVIDER_SITE_OTHER): Payer: 59 | Admitting: Family Medicine

## 2011-07-01 ENCOUNTER — Ambulatory Visit (HOSPITAL_COMMUNITY)
Admission: RE | Admit: 2011-07-01 | Discharge: 2011-07-01 | Disposition: A | Discharge status: Home / Self Care | Payer: 59 | Source: Ambulatory Visit | Attending: Family Medicine | Admitting: Family Medicine

## 2011-07-01 ENCOUNTER — Other Ambulatory Visit: Payer: Self-pay | Admitting: Family Medicine

## 2011-07-01 ENCOUNTER — Encounter: Payer: Self-pay | Admitting: Family Medicine

## 2011-07-01 DIAGNOSIS — K37 Unspecified appendicitis: Secondary | ICD-10-CM

## 2011-07-01 DIAGNOSIS — R110 Nausea: Secondary | ICD-10-CM

## 2011-07-01 DIAGNOSIS — Z9189 Other specified personal risk factors, not elsewhere classified: Secondary | ICD-10-CM

## 2011-07-01 DIAGNOSIS — N9489 Other specified conditions associated with female genital organs and menstrual cycle: Secondary | ICD-10-CM | POA: Insufficient documentation

## 2011-07-01 DIAGNOSIS — R1031 Right lower quadrant pain: Secondary | ICD-10-CM

## 2011-07-01 DIAGNOSIS — Z9089 Acquired absence of other organs: Secondary | ICD-10-CM | POA: Insufficient documentation

## 2011-07-01 DIAGNOSIS — Z9289 Personal history of other medical treatment: Secondary | ICD-10-CM

## 2011-07-01 LAB — POCT URINALYSIS DIPSTICK
Glucose, UA: NEGATIVE
Spec Grav, UA: 1.025
Urobilinogen, UA: 0.2

## 2011-07-01 NOTE — Patient Instructions (Signed)
Findings are suspicious of early appendicitis and I am referring you to once along radiology for CT scan of the abdomen and pelvis

## 2011-07-01 NOTE — Progress Notes (Signed)
  Subjective:    Patient ID: Faythe Ghee, female    DOB: 09-01-1976, 35 y.o.   MRN: [id number] This 35 year old white married female employee of [employer] is in today with a complaint of right lower quadrant pain which is been present for the past 4 days and increased didn't severity. When at rest the pain is a dull aching pain of one walking her up pain is sharp shooting pains. Patient has had malaise decreased appetite no nausea has a past history of cholecystectomy last night had cold sweats and pain in today  has been very fatigued. No nausea vomiting as above no diarrhea nor constipation. No urinary symptoms of dysuria or frequency  all negative. Urinalysis is negative HPI    Review of Systems see history of present illness     Objective:   Physical Exam the patient is a well-built well-nourished slightly obese white female who is in no distress but appears to be having pain Heart normal sinus no murmurs regular rhythm Lungs clear to palpation percussion auscultation no rales no wheezing no dullness Abdomen liver spleen kidneys are nonpalpable and has mild-to-moderate tenderness generalized over the upper abdomen pressure on the left for quadrant he is referred pain to the right lower quadrant abdominal wall no rigidity bowel sounds are quiet tenderness over McBurney's point , Suggestive rebound tenderness Pelvic examination reveals normal external introitis, uterus normal size no tenderness adnexal areas bilaterally are negative with no pain on examination rectal confirmed above        Assessment & Plan:  Abdominal pain right lower quadrant findings suggestive of early appendicitis plan to have CT scan of the abdomen and pelvis tonight, also CBC urinalysis was found to be negative in our office pregnancy test was negative

## 2011-07-06 NOTE — Progress Notes (Signed)
Called pt to make aware of ct scan; left a message for pt to return call.

## 2011-07-19 NOTE — Progress Notes (Signed)
Reviewed

## 2011-11-03 ENCOUNTER — Ambulatory Visit (INDEPENDENT_AMBULATORY_CARE_PROVIDER_SITE_OTHER): Payer: 59

## 2011-11-03 DIAGNOSIS — R05 Cough: Secondary | ICD-10-CM

## 2011-11-03 DIAGNOSIS — R059 Cough, unspecified: Secondary | ICD-10-CM

## 2012-02-04 ENCOUNTER — Ambulatory Visit: Payer: 59

## 2012-02-11 ENCOUNTER — Ambulatory Visit (INDEPENDENT_AMBULATORY_CARE_PROVIDER_SITE_OTHER): Payer: 59 | Admitting: Emergency Medicine

## 2012-02-11 VITALS — BP 112/78 | HR 74 | Temp 98.1°F | Resp 16 | Ht 65.75 in | Wt 176.6 lb

## 2012-02-11 DIAGNOSIS — J329 Chronic sinusitis, unspecified: Secondary | ICD-10-CM

## 2012-02-11 DIAGNOSIS — R05 Cough: Secondary | ICD-10-CM

## 2012-02-11 DIAGNOSIS — J040 Acute laryngitis: Secondary | ICD-10-CM

## 2012-02-11 DIAGNOSIS — R059 Cough, unspecified: Secondary | ICD-10-CM

## 2012-02-11 DIAGNOSIS — J029 Acute pharyngitis, unspecified: Secondary | ICD-10-CM

## 2012-02-11 LAB — POCT RAPID STREP A (OFFICE): Rapid Strep A Screen: NEGATIVE

## 2012-02-11 MED ORDER — AMOXICILLIN-POT CLAVULANATE 875-125 MG PO TABS: 1.0000 | ORAL_TABLET | Freq: Two times a day (BID) | ORAL | Status: AC

## 2012-02-11 MED ORDER — MOMETASONE FUROATE 50 MCG/ACT NA SUSP: 2.0000 | Freq: Every day | NASAL | Status: DC

## 2012-02-11 NOTE — Progress Notes (Signed)
  Subjective:    Patient ID: Emma Mathews, female    DOB: 1976/01/22, 36 y.o.   MRN: [id number]  HPI35 yo caucasian female presents today with sore throat, nasal congestion, cough  productive of brownish mucous x one and half weeks.    Review of Systems     Objective:   Physical Exam  Constitutional: She appears well-developed and well-nourished.  HENT:  Right Ear: External ear normal.  Left Ear: External ear normal.  Neck: No JVD present. No tracheal deviation present. No thyromegaly present.  Cardiovascular: Normal rate and regular rhythm.   Pulmonary/Chest: No respiratory distress. She has no wheezes. She has no rales. She exhibits no tenderness.  Abdominal: She exhibits no distension. There is no tenderness. There is no rebound.  Lymphadenopathy:    She has no cervical adenopathy.          Assessment & Plan:  I will check strep test then treat as a sinusitis.

## 2012-02-11 NOTE — Patient Instructions (Signed)

## 2013-09-02 ENCOUNTER — Encounter (HOSPITAL_COMMUNITY): Payer: Self-pay | Admitting: Emergency Medicine

## 2013-09-02 ENCOUNTER — Emergency Department (HOSPITAL_COMMUNITY): Payer: 59

## 2013-09-02 ENCOUNTER — Observation Stay (HOSPITAL_COMMUNITY)
Admission: EM | Admit: 2013-09-02 | Discharge: 2013-09-03 | Disposition: A | Discharge status: Home / Self Care | Payer: 59 | Attending: Surgery | Admitting: Surgery

## 2013-09-02 ENCOUNTER — Observation Stay (HOSPITAL_COMMUNITY): Payer: 59 | Admitting: Anesthesiology

## 2013-09-02 ENCOUNTER — Encounter (HOSPITAL_COMMUNITY)
Admission: EM | Disposition: A | Discharge status: Home / Self Care | Payer: Self-pay | Source: Home / Self Care | Attending: Emergency Medicine

## 2013-09-02 ENCOUNTER — Encounter (HOSPITAL_COMMUNITY): Payer: 59 | Admitting: Anesthesiology

## 2013-09-02 DIAGNOSIS — K3580 Unspecified acute appendicitis: Secondary | ICD-10-CM

## 2013-09-02 HISTORY — PX: LAPAROSCOPIC APPENDECTOMY: SHX408

## 2013-09-02 LAB — COMPREHENSIVE METABOLIC PANEL WITH GFR
ALT: 22 U/L (ref 0–35)
AST: 45 U/L — ABNORMAL HIGH (ref 0–37)
Albumin: 4.3 g/dL (ref 3.5–5.2)
Alkaline Phosphatase: 86 U/L (ref 39–117)
BUN: 12 mg/dL (ref 6–23)
CO2: 25 meq/L (ref 19–32)
Calcium: 9.4 mg/dL (ref 8.4–10.5)
Chloride: 100 meq/L (ref 96–112)
Creatinine, Ser: 0.68 mg/dL (ref 0.50–1.10)
GFR calc Af Amer: >90 mL/min
GFR calc non Af Amer: >90 mL/min
Glucose, Bld: 89 mg/dL (ref 70–99)
Potassium: 5.6 meq/L — ABNORMAL HIGH (ref 3.5–5.1)
Sodium: 135 meq/L (ref 135–145)
Total Bilirubin: 0.6 mg/dL (ref 0.3–1.2)
Total Protein: 8.4 g/dL — ABNORMAL HIGH (ref 6.0–8.3)

## 2013-09-02 LAB — URINE MICROSCOPIC-ADD ON

## 2013-09-02 LAB — CBC WITH DIFFERENTIAL/PLATELET
Basophils Absolute: 0 10*3/uL (ref 0.0–0.1)
Basophils Relative: 0 % (ref 0–1)
Eosinophils Absolute: 0.5 10*3/uL (ref 0.0–0.7)
Eosinophils Relative: 7 % — ABNORMAL HIGH (ref 0–5)
HCT: 43.1 % (ref 36.0–46.0)
Hemoglobin: 15.2 g/dL — ABNORMAL HIGH (ref 12.0–15.0)
Lymphocytes Relative: 27 % (ref 12–46)
Lymphs Abs: 2 10*3/uL (ref 0.7–4.0)
MCH: 29.9 pg (ref 26.0–34.0)
MCHC: 35.3 g/dL (ref 30.0–36.0)
MCV: 84.7 fL (ref 78.0–100.0)
Monocytes Absolute: 0.5 10*3/uL (ref 0.1–1.0)
Monocytes Relative: 7 % (ref 3–12)
Neutro Abs: 4.4 10*3/uL (ref 1.7–7.7)
Neutrophils Relative %: 59 % (ref 43–77)
Platelets: 215 10*3/uL (ref 150–400)
RBC: 5.09 MIL/uL (ref 3.87–5.11)
RDW: 13 % (ref 11.5–15.5)
WBC: 7.4 10*3/uL (ref 4.0–10.5)

## 2013-09-02 LAB — URINALYSIS, ROUTINE W REFLEX MICROSCOPIC
Bilirubin Urine: NEGATIVE
Glucose, UA: NEGATIVE mg/dL
Hgb urine dipstick: NEGATIVE
Ketones, ur: NEGATIVE mg/dL
Nitrite: NEGATIVE
Protein, ur: NEGATIVE mg/dL
Specific Gravity, Urine: 1.013 (ref 1.005–1.030)
Urobilinogen, UA: 0.2 mg/dL (ref 0.0–1.0)
pH: 7 (ref 5.0–8.0)

## 2013-09-02 LAB — MRSA PCR SCREENING: MRSA by PCR: NEGATIVE

## 2013-09-02 SURGERY — APPENDECTOMY, LAPAROSCOPIC
Anesthesia: General | Wound class: Contaminated

## 2013-09-02 MED ORDER — ONDANSETRON HCL 4 MG/2ML IJ SOLN
4.0000 mg | Freq: Four times a day (QID) | INTRAMUSCULAR | Status: DC | PRN
  Administered 2013-09-02: 4 mg via INTRAVENOUS
  Filled 2013-09-02: qty 2

## 2013-09-02 MED ORDER — BUPIVACAINE-EPINEPHRINE 0.5% -1:200000 IJ SOLN
INTRAMUSCULAR | Status: DC | PRN
  Administered 2013-09-02: 20 mL

## 2013-09-02 MED ORDER — OXYCODONE HCL 5 MG PO TABS: 5.0000 mg | ORAL_TABLET | Freq: Once | ORAL | Status: DC | PRN

## 2013-09-02 MED ORDER — BUPIVACAINE-EPINEPHRINE (PF) 0.5% -1:200000 IJ SOLN
INTRAMUSCULAR | Status: AC
  Filled 2013-09-02: qty 10

## 2013-09-02 MED ORDER — FENTANYL CITRATE 0.05 MG/ML IJ SOLN: 50.0000 ug | Freq: Once | INTRAMUSCULAR | Status: DC

## 2013-09-02 MED ORDER — MORPHINE SULFATE 2 MG/ML IJ SOLN
2.0000 mg | Freq: Once | INTRAMUSCULAR | Status: AC
  Administered 2013-09-02: 2 mg via INTRAVENOUS
  Filled 2013-09-02: qty 1

## 2013-09-02 MED ORDER — HYDROMORPHONE HCL PF 1 MG/ML IJ SOLN
1.0000 mg | INTRAMUSCULAR | Status: DC | PRN
  Administered 2013-09-02: 1 mg via INTRAVENOUS
  Filled 2013-09-02: qty 1

## 2013-09-02 MED ORDER — IBUPROFEN 600 MG PO TABS: 600.0000 mg | ORAL_TABLET | Freq: Four times a day (QID) | ORAL | Status: DC | PRN

## 2013-09-02 MED ORDER — SUCCINYLCHOLINE CHLORIDE 20 MG/ML IJ SOLN
INTRAMUSCULAR | Status: DC | PRN
  Administered 2013-09-02: 100 mg via INTRAVENOUS

## 2013-09-02 MED ORDER — SODIUM CHLORIDE 0.9 % IV SOLN
INTRAVENOUS | Status: DC
  Administered 2013-09-02: 18:00:00 via INTRAVENOUS

## 2013-09-02 MED ORDER — ONDANSETRON HCL 4 MG/2ML IJ SOLN
4.0000 mg | Freq: Four times a day (QID) | INTRAMUSCULAR | Status: DC | PRN
  Administered 2013-09-02: 4 mg via INTRAVENOUS
  Filled 2013-09-02 (×2): qty 2

## 2013-09-02 MED ORDER — OXYCODONE HCL 5 MG/5ML PO SOLN: 5.0000 mg | Freq: Once | ORAL | Status: DC | PRN

## 2013-09-02 MED ORDER — HYDROMORPHONE HCL PF 1 MG/ML IJ SOLN: 0.2500 mg | INTRAMUSCULAR | Status: DC | PRN

## 2013-09-02 MED ORDER — NEOSTIGMINE METHYLSULFATE 1 MG/ML IJ SOLN
INTRAMUSCULAR | Status: DC | PRN
  Administered 2013-09-02: 2 mg via INTRAVENOUS

## 2013-09-02 MED ORDER — LIDOCAINE HCL (CARDIAC) 20 MG/ML IV SOLN
INTRAVENOUS | Status: DC | PRN
  Administered 2013-09-02: 100 mg via INTRAVENOUS

## 2013-09-02 MED ORDER — PROMETHAZINE HCL 25 MG/ML IJ SOLN
12.5000 mg | Freq: Four times a day (QID) | INTRAMUSCULAR | Status: DC | PRN
  Administered 2013-09-02 – 2013-09-03 (×3): 12.5 mg via INTRAVENOUS
  Filled 2013-09-02 (×3): qty 1

## 2013-09-02 MED ORDER — PIPERACILLIN-TAZOBACTAM 3.375 G IVPB
3.3750 g | Freq: Three times a day (TID) | INTRAVENOUS | Status: DC
  Administered 2013-09-02 – 2013-09-03 (×3): 3.375 g via INTRAVENOUS
  Filled 2013-09-02 (×4): qty 50

## 2013-09-02 MED ORDER — FENTANYL CITRATE 0.05 MG/ML IJ SOLN
INTRAMUSCULAR | Status: DC | PRN
  Administered 2013-09-02: 100 ug via INTRAVENOUS
  Administered 2013-09-02 (×2): 50 ug via INTRAVENOUS

## 2013-09-02 MED ORDER — ONDANSETRON HCL 4 MG/2ML IJ SOLN
4.0000 mg | Freq: Once | INTRAMUSCULAR | Status: AC
  Administered 2013-09-02: 4 mg via INTRAVENOUS
  Filled 2013-09-02: qty 2

## 2013-09-02 MED ORDER — MIDAZOLAM HCL 5 MG/5ML IJ SOLN
INTRAMUSCULAR | Status: DC | PRN
  Administered 2013-09-02: 2 mg via INTRAVENOUS

## 2013-09-02 MED ORDER — HYDROMORPHONE HCL PF 1 MG/ML IJ SOLN: 1.0000 mg | INTRAMUSCULAR | Status: DC | PRN

## 2013-09-02 MED ORDER — OXYCODONE-ACETAMINOPHEN 5-325 MG PO TABS
1.0000 | ORAL_TABLET | ORAL | Status: DC | PRN
  Administered 2013-09-02 – 2013-09-03 (×2): 2 via ORAL
  Filled 2013-09-02 (×3): qty 2

## 2013-09-02 MED ORDER — DIPHENHYDRAMINE HCL 50 MG/ML IJ SOLN: 12.5000 mg | Freq: Four times a day (QID) | INTRAMUSCULAR | Status: DC | PRN

## 2013-09-02 MED ORDER — PROMETHAZINE HCL 25 MG/ML IJ SOLN
INTRAMUSCULAR | Status: AC
  Administered 2013-09-02: 6.25 mg via INTRAVENOUS
  Filled 2013-09-02: qty 1

## 2013-09-02 MED ORDER — ROCURONIUM BROMIDE 100 MG/10ML IV SOLN
INTRAVENOUS | Status: DC | PRN
  Administered 2013-09-02: 15 mg via INTRAVENOUS

## 2013-09-02 MED ORDER — PROMETHAZINE HCL 25 MG/ML IJ SOLN
6.2500 mg | INTRAMUSCULAR | Status: DC | PRN
  Administered 2013-09-02: 6.25 mg via INTRAVENOUS

## 2013-09-02 MED ORDER — MIDAZOLAM HCL 2 MG/2ML IJ SOLN: 1.0000 mg | INTRAMUSCULAR | Status: DC | PRN

## 2013-09-02 MED ORDER — GLYCOPYRROLATE 0.2 MG/ML IJ SOLN
INTRAMUSCULAR | Status: DC | PRN
  Administered 2013-09-02: 0.4 mg via INTRAVENOUS

## 2013-09-02 MED ORDER — SODIUM CHLORIDE 0.9 % IV BOLUS (SEPSIS)
500.0000 mL | Freq: Once | INTRAVENOUS | Status: AC
  Administered 2013-09-02: 500 mL via INTRAVENOUS

## 2013-09-02 MED ORDER — LACTATED RINGERS IV SOLN
INTRAVENOUS | Status: DC | PRN
  Administered 2013-09-02 (×2): via INTRAVENOUS

## 2013-09-02 MED ORDER — IOHEXOL 300 MG/ML  SOLN
25.0000 mL | INTRAMUSCULAR | Status: DC | PRN
  Administered 2013-09-02: 25 mL via ORAL

## 2013-09-02 MED ORDER — MORPHINE SULFATE 4 MG/ML IJ SOLN
4.0000 mg | Freq: Once | INTRAMUSCULAR | Status: AC
  Administered 2013-09-02: 4 mg via INTRAVENOUS
  Filled 2013-09-02: qty 1

## 2013-09-02 MED ORDER — KETOROLAC TROMETHAMINE 30 MG/ML IJ SOLN
30.0000 mg | Freq: Once | INTRAMUSCULAR | Status: AC
  Administered 2013-09-02: 30 mg via INTRAVENOUS
  Filled 2013-09-02: qty 1

## 2013-09-02 MED ORDER — IOHEXOL 300 MG/ML  SOLN
100.0000 mL | Freq: Once | INTRAMUSCULAR | Status: AC | PRN
  Administered 2013-09-02: 100 mL via INTRAVENOUS

## 2013-09-02 MED ORDER — ENOXAPARIN SODIUM 40 MG/0.4ML ~~LOC~~ SOLN
40.0000 mg | SUBCUTANEOUS | Status: DC
Start: 2013-09-03 — End: 2013-09-03
  Administered 2013-09-03: 40 mg via SUBCUTANEOUS
  Filled 2013-09-02 (×2): qty 0.4

## 2013-09-02 MED ORDER — PROPOFOL 10 MG/ML IV BOLUS
INTRAVENOUS | Status: DC | PRN
  Administered 2013-09-02: 200 mg via INTRAVENOUS

## 2013-09-02 MED ORDER — ONDANSETRON HCL 4 MG PO TABS: 4.0000 mg | ORAL_TABLET | Freq: Four times a day (QID) | ORAL | Status: DC | PRN

## 2013-09-02 SURGICAL SUPPLY — 41 items
APL SKNCLS STERI-STRIP NONHPOA (GAUZE/BANDAGES/DRESSINGS) ×1
APPLIER CLIP LOGIC TI 5 (MISCELLANEOUS) IMPLANT
APPLIER CLIP ROT 10 11.4 M/L (STAPLE)
APR CLP MED LRG 11.4X10 (STAPLE)
APR CLP MED LRG 33X5 (MISCELLANEOUS)
BAG SPEC RTRVL LRG 6X4 10 (ENDOMECHANICALS) ×1
BANDAGE ADHESIVE 1X3 (GAUZE/BANDAGES/DRESSINGS) ×4 IMPLANT
BENZOIN TINCTURE PRP APPL 2/3 (GAUZE/BANDAGES/DRESSINGS) ×2 IMPLANT
CANISTER SUCTION 2500CC (MISCELLANEOUS) ×2 IMPLANT
CHLORAPREP W/TINT 26ML (MISCELLANEOUS) ×2 IMPLANT
CLIP APPLIE ROT 10 11.4 M/L (STAPLE) IMPLANT
CLOSURE STERI-STRIP 1/4X4 (GAUZE/BANDAGES/DRESSINGS) ×1 IMPLANT
COVER SURGICAL LIGHT HANDLE (MISCELLANEOUS) ×2 IMPLANT
CUTTER LINEAR ENDO 35 ETS (STAPLE) IMPLANT
CUTTER LINEAR ENDO 35 ETS TH (STAPLE) IMPLANT
DECANTER SPIKE VIAL GLASS SM (MISCELLANEOUS) ×2 IMPLANT
ELECT REM PT RETURN 9FT ADLT (ELECTROSURGICAL) ×2
ELECTRODE REM PT RTRN 9FT ADLT (ELECTROSURGICAL) ×1 IMPLANT
ENDOLOOP SUT PDS II  0 18 (SUTURE)
ENDOLOOP SUT PDS II 0 18 (SUTURE) IMPLANT
GLOVE SURG SIGNA 7.5 PF LTX (GLOVE) ×2 IMPLANT
GOWN STRL NON-REIN LRG LVL3 (GOWN DISPOSABLE) ×2 IMPLANT
GOWN STRL REIN XL XLG (GOWN DISPOSABLE) ×2 IMPLANT
KIT BASIN OR (CUSTOM PROCEDURE TRAY) ×2 IMPLANT
KIT ROOM TURNOVER OR (KITS) ×2 IMPLANT
NS IRRIG 1000ML POUR BTL (IV SOLUTION) ×2 IMPLANT
PAD ARMBOARD 7.5X6 YLW CONV (MISCELLANEOUS) ×4 IMPLANT
POUCH SPECIMEN RETRIEVAL 10MM (ENDOMECHANICALS) ×2 IMPLANT
RELOAD /EVU35 (ENDOMECHANICALS) IMPLANT
RELOAD CUTTER ETS 35MM STAND (ENDOMECHANICALS) ×1 IMPLANT
SCALPEL HARMONIC ACE (MISCELLANEOUS) ×2 IMPLANT
SET IRRIG TUBING LAPAROSCOPIC (IRRIGATION / IRRIGATOR) ×2 IMPLANT
SLEEVE ENDOPATH XCEL 5M (ENDOMECHANICALS) ×2 IMPLANT
SPECIMEN JAR SMALL (MISCELLANEOUS) ×2 IMPLANT
SUT MON AB 4-0 PC3 18 (SUTURE) ×2 IMPLANT
TOWEL OR 17X24 6PK STRL BLUE (TOWEL DISPOSABLE) ×2 IMPLANT
TOWEL OR 17X26 10 PK STRL BLUE (TOWEL DISPOSABLE) ×2 IMPLANT
TRAY LAPAROSCOPIC (CUSTOM PROCEDURE TRAY) ×2 IMPLANT
TROCAR XCEL BLUNT TIP 100MML (ENDOMECHANICALS) ×2 IMPLANT
TROCAR XCEL NON-BLD 5MMX100MML (ENDOMECHANICALS) ×2 IMPLANT
WATER STERILE IRR 1000ML POUR (IV SOLUTION) IMPLANT

## 2013-09-02 NOTE — Transfer of Care (Signed)
Immediate Anesthesia Transfer of Care Note  Patient: Emma Mathews  Procedure(s) Performed: Procedure(s): APPENDECTOMY LAPAROSCOPIC (N/A)  Patient Location: PACU  Anesthesia Type:General  Level of Consciousness: sedated  Airway & Oxygen Therapy: Patient Spontanous Breathing and Patient connected to nasal cannula oxygen  Post-op Assessment: Report given to PACU RN and Post -op Vital signs reviewed and stable  Post vital signs: Reviewed and stable  Complications: No apparent anesthesia complications

## 2013-09-02 NOTE — Anesthesia Preprocedure Evaluation (Addendum)
Anesthesia Evaluation  Patient identified by MRN, date of birth, ID band Patient awake    Reviewed: Allergy & Precautions, H&P , NPO status , Patient's Chart, lab work & pertinent test results  Airway Mallampati: I TM Distance: >3 FB Neck ROM: Full    Dental  (+) Dental Advisory Given and Teeth Intact   Pulmonary  breath sounds clear to auscultation        Cardiovascular Rhythm:Regular Rate:Normal     Neuro/Psych    GI/Hepatic   Endo/Other    Renal/GU      Musculoskeletal   Abdominal   Peds  Hematology   Anesthesia Other Findings   Reproductive/Obstetrics                          Anesthesia Physical Anesthesia Plan  ASA: I and emergent  Anesthesia Plan: General   Post-op Pain Management:    Induction: Intravenous  Airway Management Planned: Oral ETT  Additional Equipment:   Intra-op Plan:   Post-operative Plan: Extubation in OR  Informed Consent: I have reviewed the patients History and Physical, chart, labs and discussed the procedure including the risks, benefits and alternatives for the proposed anesthesia with the patient or authorized representative who has indicated his/her understanding and acceptance.     Plan Discussed with: CRNA and Surgeon  Anesthesia Plan Comments:         Anesthesia Quick Evaluation

## 2013-09-02 NOTE — ED Notes (Signed)
Pt c/o increased abdominal pain sts pain has moved and increased and radiating to right side. Pt sts it feels like its spasming and unable to take deep breaths due to pain. Dr. Magnus Ivan paged and pt placed on 2L [place] for comfort.   SpO2-100%/RA HR-69

## 2013-09-02 NOTE — Anesthesia Procedure Notes (Signed)
Procedure Name: Intubation Date/Time: 09/02/2013 4:08 PM Performed by: Alanda Amass A Pre-anesthesia Checklist: Patient identified, Timeout performed, Emergency Drugs available, Suction available and Patient being monitored Patient Re-evaluated:Patient Re-evaluated prior to inductionOxygen Delivery Method: Circle system utilized Preoxygenation: Pre-oxygenation with 100% oxygen Intubation Type: IV induction, Rapid sequence and Cricoid Pressure applied Laryngoscope Size: Mac and 3 Grade View: Grade I Tube type: Oral Tube size: 7.5 mm Number of attempts: 1 Airway Equipment and Method: Stylet Placement Confirmation: ETT inserted through vocal cords under direct vision,  breath sounds checked- equal and bilateral and positive ETCO2 Secured at: 21 cm Tube secured with: Tape Dental Injury: Teeth and Oropharynx as per pre-operative assessment

## 2013-09-02 NOTE — ED Notes (Addendum)
Clifton Custard (spouse)  [phone]

## 2013-09-02 NOTE — ED Provider Notes (Signed)
CSN: [id number]     Arrival date & time 09/02/13  1005 History   First MD Initiated Contact with Patient 09/02/13 1017     Chief Complaint  Patient presents with  . Abdominal Pain   (Consider location/radiation/quality/duration/timing/severity/associated sxs/prior Treatment) Patient is a 37 y.o. female presenting with abdominal pain. The history is provided by the patient.  Abdominal Pain Associated symptoms: fatigue   Associated symptoms: no chest pain, no diarrhea, no nausea, no shortness of breath and no vomiting    patient is felt bad for the last day or 2, worse yesterday. She had some right-sided abdominal pain. It got more severe today. She's had decreased appetite. No nausea vomiting. No diarrhea constipation. She states her abdomen feels "full. No vaginal bleeding or discharge. No dysuria. The pain is constant but does come and go in severity. It is dull.  History reviewed. No pertinent past medical history. Past Surgical History  Procedure Laterality Date  . Cholecystectomy     History reviewed. No pertinent family history. History  Substance Use Topics  . Smoking status: Never Smoker   . Smokeless tobacco: Not on file  . Alcohol Use: Not on file   OB History   Grav Para Term Preterm Abortions TAB SAB Ect Mult Living                 Review of Systems  Constitutional: Positive for appetite change and fatigue. Negative for activity change.  Eyes: Negative for pain.  Respiratory: Negative for chest tightness and shortness of breath.   Cardiovascular: Negative for chest pain and leg swelling.  Gastrointestinal: Positive for abdominal pain. Negative for nausea, vomiting and diarrhea.  Genitourinary: Negative for flank pain.  Musculoskeletal: Negative for back pain and neck stiffness.  Skin: Negative for rash.  Neurological: Negative for weakness, numbness and headaches.  Psychiatric/Behavioral: Negative for behavioral problems.    Allergies  Diphenhydramine hcl;  Codeine; and Contrast media  Home Medications   No current outpatient prescriptions on file. BP 119/66  Pulse 70  Temp(Src) 97.7 F (36.5 C) (Oral)  Resp 16  SpO2 100% Physical Exam  Nursing note and vitals reviewed. Constitutional: She is oriented to person, place, and time. She appears well-developed and well-nourished.  Patient appears uncomfortable  HENT:  Head: Normocephalic and atraumatic.  Eyes: EOM are normal. Pupils are equal, round, and reactive to light.  Neck: Normal range of motion. Neck supple.  Cardiovascular: Normal rate, regular rhythm and normal heart sounds.   No murmur heard. Pulmonary/Chest: Effort normal and breath sounds normal. No respiratory distress. She has no wheezes. She has no rales.  Abdominal: Soft. Bowel sounds are normal. She exhibits no distension. There is tenderness. There is no rebound and no guarding.  Moderate right lower quadrant abdominal tenderness. No hernias palpated.  Musculoskeletal: Normal range of motion.  Neurological: She is alert and oriented to person, place, and time. No cranial nerve deficit.  Skin: Skin is warm and dry.  Psychiatric: She has a normal mood and affect. Her speech is normal.    ED Course  Procedures (including critical care time) Labs Review Labs Reviewed  CBC WITH DIFFERENTIAL - Abnormal; Notable for the following:    Hemoglobin 15.2 (*)    Eosinophils Relative 7 (*)    All other components within normal limits  COMPREHENSIVE METABOLIC PANEL - Abnormal; Notable for the following:    Potassium 5.6 (*)    Total Protein 8.4 (*)    AST 45 (*)  All other components within normal limits  URINALYSIS, ROUTINE W REFLEX MICROSCOPIC - Abnormal; Notable for the following:    APPearance CLOUDY (*)    Leukocytes, UA MODERATE (*)    All other components within normal limits  URINE MICROSCOPIC-ADD ON - Abnormal; Notable for the following:    Squamous Epithelial / LPF FEW (*)    Bacteria, UA FEW (*)    All other  components within normal limits  URINE CULTURE  MRSA PCR SCREENING  POCT PREGNANCY, URINE   Imaging Review Ct Abdomen Pelvis W Contrast  09/02/2013   CLINICAL DATA:  Right lower quadrant pain  EXAM: CT ABDOMEN AND PELVIS WITH CONTRAST  TECHNIQUE: Multidetector CT imaging of the abdomen and pelvis was performed using the standard protocol following bolus administration of intravenous contrast.  CONTRAST:  OMNIPAQUE IOHEXOL 300 MG/ML  SOLN  COMPARISON:  07/01/2011  FINDINGS: The appendix is enhancing and thickened. It does not contain gas. There is slight stranding in the adjacent fat. Findings are compatible with early acute appendicitis.  2.1 cm cystic lesion in the left adnexa. Previously noted right adnexal cystic lesion has resolved.  Postcholecystectomy.  Spleen, kidneys, pancreas are stable in appearance.  No free-fluid. No extraluminal bowel gas.  IUD is in place.  Left over an vein is prominent and is contrast-enhancing worrisome for venous reflux. At least to small left-sided pelvic varices.  No vertebral compression deformity. Severe narrowing of the L4-5 disc.  IMPRESSION: Findings are compatible with early acute appendicitis.  Critical Value/emergent results were called by telephone at the time of interpretation on 09/02/2013 at 12:43 PM to [facility] , who verbally acknowledged these results.  2.1 cm left adnexal cystic lesion. This is likely benign. Consider 6-12 week followup to ensure resolution.   Electronically Signed   By: Maryclare Bean M.D.   On: 09/02/2013 12:43    EKG Interpretation     Ventricular Rate:    PR Interval:    QRS Duration:   QT Interval:    QTC Calculation:   R Axis:     Text Interpretation:              MDM   1. Acute appendicitis    CT proven appendicitis. Otherwise healthy female. Admitted to general surgery    Juliet Rude. Rubin Payor, MD 09/02/13 1626

## 2013-09-02 NOTE — Anesthesia Postprocedure Evaluation (Signed)
  Anesthesia Post-op Note  Patient: Emma Mathews  Procedure(s) Performed: Procedure(s): APPENDECTOMY LAPAROSCOPIC (N/A)  Patient Location: PACU  Anesthesia Type:General  Level of Consciousness: awake and alert   Airway and Oxygen Therapy: Patient Spontanous Breathing  Post-op Pain: mild  Post-op Assessment: Post-op Vital signs reviewed, Patient's Cardiovascular Status Stable, Respiratory Function Stable, Patent Airway, No signs of Nausea or vomiting and Pain level controlled  Post-op Vital Signs: Reviewed and stable  Complications: No apparent anesthesia complications

## 2013-09-02 NOTE — Op Note (Signed)
Appendectomy, Lap, Procedure Note  Indications: The patient presented with a history of right-sided abdominal pain. A CT revealed findings consistent with acute appendicitis.  Pre-operative Diagnosis: Acute appendicitis without mention of peritonitis  Post-operative Diagnosis: Same  Surgeon: Abigail Miyamoto A   Assistants: 0  Anesthesia: General endotracheal anesthesia  ASA Class: 1  Procedure Details  The patient was seen again in the Holding Room. The risks, benefits, complications, treatment options, and expected outcomes were discussed with the patient and/or family. The possibilities of reaction to medication, perforation of viscus, bleeding, recurrent infection, finding a normal appendix, the need for additional procedures, failure to diagnose a condition, and creating a complication requiring transfusion or operation were discussed. There was concurrence with the proposed plan and informed consent was obtained. The site of surgery was properly noted. The patient was taken to Operating Room, identified as Emma Mathews and the procedure verified as Appendectomy. A Time Out was held and the above information confirmed.  The patient was placed in the supine position and general anesthesia was induced, along with placement of orogastric tube, Venodyne boots, and a Foley catheter. The abdomen was prepped and draped in a sterile fashion. A one centimeter infraumbilical incision was made.  The umbilical stalk was elevated, and the midline fascia was incised with a #11 blade.  A Kelly clamp was used to confirm entrance into the peritoneal cavity.  A pursestring suture was passed around the incision with a 0 Vicryl.  The Hasson was introduced into the abdomen and the tails of the suture were used to hold the Hasson in place.   The pneumoperitoneum was then established to steady pressure of 15 mmHg.  Additional 5 mm cannulas then placed in the left lower quadrant of the abdomen and the  suprapubic region under direct visualization. A careful evaluation of the entire abdomen was carried out. The patient was placed in Trendelenburg and left lateral decubitus position. The small intestines were retracted in the cephalad and left lateral direction away from the pelvis and right lower quadrant. The patient was found to have an enlarged and inflamed appendix that was extending into the pelvis. There was no evidence of perforation.  The appendix was carefully dissected. The appendix was was skeletonized with the harmonic scalpel.   The appendix was divided at its base using an endo-GIA stapler. Minimal appendiceal stump was left in place. There was no evidence of bleeding, leakage, or complication after division of the appendix. Irrigation was also performed and irrigate suctioned from the abdomen as well.  The umbilical port site was closed with the purse string suture. There was no residual palpable fascial defect.  The trocar site skin wounds were closed with 4-0 Monocryl.  Instrument, sponge, and needle counts were correct at the conclusion of the case.   Findings: The appendix was found to be inflamed. There were not signs of necrosis.  There was not perforation. There was not abscess formation.  Estimated Blood Loss:  Minimal         Drains:none         Complications:  None; patient tolerated the procedure well.         Disposition: PACU - hemodynamically stable.         Condition: stable

## 2013-09-02 NOTE — ED Notes (Signed)
Per pt sts since last night she has had RLQ pain with nausea. sts hurts when she moves but worse with palpation. sts rebound tenderness.

## 2013-09-02 NOTE — H&P (Signed)
Emma Mathews is an 37 y.o. female.   Chief Complaint: Right lower quadrant abdominal pain HPI: This is a pleasant female who presents with a less than 24-hour history of abdominal pain. She started feeling poorly yesterday afternoon with a vague abdominal discomfort. She had generalized malaise and some mild nausea but no emesis. She denied fevers. The pain has since moved to the right lower quadrant. It is a mild dull ache  It hurts when she walks. It is constant. It is not refer anywhere else. She is otherwise healthy and without complaints  History reviewed. No pertinent past medical history.  Past Surgical History  Procedure Laterality Date  . Cholecystectomy      History reviewed. No pertinent family history. Social History:  reports that she has never smoked. She does not have any smokeless tobacco history on file. Her alcohol and drug histories are not on file.  Allergies:  Allergies  Allergen Reactions  . Diphenhydramine Hcl Hives and Other (See Comments)    Pt states she can take Dye free Benadryl  . Codeine Hives and Nausea And Vomiting  . Contrast Media [Iodinated Diagnostic Agents] Nausea And Vomiting     (Not in a hospital admission)  Results for orders placed during the hospital encounter of 09/02/13 (from the past 48 hour(s))  CBC WITH DIFFERENTIAL     Status: Abnormal   Collection Time    09/02/13 10:25 AM      Result Value Range   WBC 7.4  4.0 - 10.5 K/uL   RBC 5.09  3.87 - 5.11 MIL/uL   Hemoglobin 15.2 (*) 12.0 - 15.0 g/dL   HCT 42.5  95.6 - 38.7 %   MCV 84.7  78.0 - 100.0 fL   MCH 29.9  26.0 - 34.0 pg   MCHC 35.3  30.0 - 36.0 g/dL   RDW 56.4  33.2 - 95.1 %   Platelets 215  150 - 400 K/uL   Neutrophils Relative % 59  43 - 77 %   Neutro Abs 4.4  1.7 - 7.7 K/uL   Lymphocytes Relative 27  12 - 46 %   Lymphs Abs 2.0  0.7 - 4.0 K/uL   Monocytes Relative 7  3 - 12 %   Monocytes Absolute 0.5  0.1 - 1.0 K/uL   Eosinophils Relative 7 (*) 0 - 5 %   Eosinophils Absolute 0.5  0.0 - 0.7 K/uL   Basophils Relative 0  0 - 1 %   Basophils Absolute 0.0  0.0 - 0.1 K/uL  COMPREHENSIVE METABOLIC PANEL     Status: Abnormal   Collection Time    09/02/13 10:25 AM      Result Value Range   Sodium 135  135 - 145 mEq/L   Potassium 5.6 (*) 3.5 - 5.1 mEq/L   Comment: HEMOLYSIS AT THIS LEVEL MAY AFFECT RESULT   Chloride 100  96 - 112 mEq/L   CO2 25  19 - 32 mEq/L   Glucose, Bld 89  70 - 99 mg/dL   BUN 12  6 - 23 mg/dL   Creatinine, Ser 8.84  0.50 - 1.10 mg/dL   Calcium 9.4  8.4 - 16.6 mg/dL   Total Protein 8.4 (*) 6.0 - 8.3 g/dL   Albumin 4.3  3.5 - 5.2 g/dL   AST 45 (*) 0 - 37 U/L   ALT 22  0 - 35 U/L   Comment: HEMOLYSIS AT THIS LEVEL MAY AFFECT RESULT   Alkaline Phosphatase 86  39 - 117 U/L   Comment: HEMOLYSIS AT THIS LEVEL MAY AFFECT RESULT   Total Bilirubin 0.6  0.3 - 1.2 mg/dL   GFR calc non Af Amer >90  >90 mL/min   GFR calc Af Amer >90  >90 mL/min   Comment: (NOTE)     The eGFR has been calculated using the CKD EPI equation.     This calculation has not been validated in all clinical situations.     eGFR's persistently <90 mL/min signify possible Chronic Kidney     Disease.  URINALYSIS, ROUTINE W REFLEX MICROSCOPIC     Status: Abnormal   Collection Time    09/02/13 11:39 AM      Result Value Range   Color, Urine YELLOW  YELLOW   APPearance CLOUDY (*) CLEAR   Specific Gravity, Urine 1.013  1.005 - 1.030   pH 7.0  5.0 - 8.0   Glucose, UA NEGATIVE  NEGATIVE mg/dL   Hgb urine dipstick NEGATIVE  NEGATIVE   Bilirubin Urine NEGATIVE  NEGATIVE   Ketones, ur NEGATIVE  NEGATIVE mg/dL   Protein, ur NEGATIVE  NEGATIVE mg/dL   Urobilinogen, UA 0.2  0.0 - 1.0 mg/dL   Nitrite NEGATIVE  NEGATIVE   Leukocytes, UA MODERATE (*) NEGATIVE  URINE MICROSCOPIC-ADD ON     Status: Abnormal   Collection Time    09/02/13 11:39 AM      Result Value Range   Squamous Epithelial / LPF FEW (*) RARE   WBC, UA 7-10  <3 WBC/hpf   Bacteria, UA FEW (*)  RARE   Urine-Other FEW YEAST    POCT PREGNANCY, URINE     Status: None   Collection Time    09/02/13 11:51 AM      Result Value Range   Preg Test, Ur NEGATIVE  NEGATIVE   Comment:            THE SENSITIVITY OF THIS     METHODOLOGY IS >24 mIU/mL   Ct Abdomen Pelvis W Contrast  09/02/2013   CLINICAL DATA:  Right lower quadrant pain  EXAM: CT ABDOMEN AND PELVIS WITH CONTRAST  TECHNIQUE: Multidetector CT imaging of the abdomen and pelvis was performed using the standard protocol following bolus administration of intravenous contrast.  CONTRAST:  OMNIPAQUE IOHEXOL 300 MG/ML  SOLN  COMPARISON:  07/01/2011  FINDINGS: The appendix is enhancing and thickened. It does not contain gas. There is slight stranding in the adjacent fat. Findings are compatible with early acute appendicitis.  2.1 cm cystic lesion in the left adnexa. Previously noted right adnexal cystic lesion has resolved.  Postcholecystectomy.  Spleen, kidneys, pancreas are stable in appearance.  No free-fluid. No extraluminal bowel gas.  IUD is in place.  Left over an vein is prominent and is contrast-enhancing worrisome for venous reflux. At least to small left-sided pelvic varices.  No vertebral compression deformity. Severe narrowing of the L4-5 disc.  IMPRESSION: Findings are compatible with early acute appendicitis.  Critical Value/emergent results were called by telephone at the time of interpretation on 09/02/2013 at 12:43 PM to [facility] , who verbally acknowledged these results.  2.1 cm left adnexal cystic lesion. This is likely benign. Consider 6-12 week followup to ensure resolution.   Electronically Signed   By: Maryclare Bean M.D.   On: 09/02/2013 12:43    Review of Systems  All other systems reviewed and are negative.    Blood pressure 115/65, pulse 55, temperature 98.1 F (36.7 C), temperature source Oral,  resp. rate 16, SpO2 100.00%. Physical Exam  Constitutional: She is oriented to person, place, and time. She  appears well-developed and well-nourished. No distress.  HENT:  Head: Normocephalic and atraumatic.  Right Ear: External ear normal.  Left Ear: External ear normal.  Nose: Nose normal.  Mouth/Throat: Oropharynx is clear and moist.  Eyes: Conjunctivae are normal. Pupils are equal, round, and reactive to light. Right eye exhibits no discharge. Left eye exhibits no discharge. No scleral icterus.  Neck: Normal range of motion. Neck supple. No tracheal deviation present. No thyromegaly present.  Cardiovascular: Normal rate, regular rhythm, normal heart sounds and intact distal pulses.   Respiratory: Effort normal and breath sounds normal. No respiratory distress. She has no wheezes.  GI: Soft. Bowel sounds are normal. There is tenderness. There is guarding.  There is tenderness with guarding in the right lower quadrant  Musculoskeletal: Normal range of motion. She exhibits no edema and no tenderness.  Lymphadenopathy:    She has no cervical adenopathy.  Neurological: She is alert and oriented to person, place, and time.  Skin: Skin is warm and dry. No erythema.  Psychiatric: Her behavior is normal. Judgment normal.     Assessment/Plan Acute appendicitis  Appendectomy is recommended. I discussed the laparoscopic approach with her and her family. I discussed the risk of surgery which includes but is not limited to bleeding, infection, injury to shredding structures, appendiceal stump leak, the need to convert to an open procedure, et Karie Soda. She understands and wishes to proceed. Surgery is scheduled for today. IV antibiotics are given.  Keirra Zeimet A 09/02/2013, 1:13 PM

## 2013-09-02 NOTE — ED Notes (Signed)
Pt knows that urine is needed 

## 2013-09-02 NOTE — Preoperative (Signed)
Beta Blockers   Reason not to administer Beta Blockers:not on beta blocker 

## 2013-09-02 NOTE — ED Notes (Signed)
OR called sts pt will not go to surgery for 'a couple of hours'

## 2013-09-02 NOTE — Progress Notes (Signed)
Pt to room 6N28 from ED and then OR called to get pt for OR.  Consents signed and MRSA PCR screen sent to lab.

## 2013-09-03 ENCOUNTER — Encounter (HOSPITAL_COMMUNITY): Payer: Self-pay | Admitting: Surgery

## 2013-09-03 LAB — URINE CULTURE

## 2013-09-03 MED ORDER — PROMETHAZINE HCL 12.5 MG PO TABS: 12.5000 mg | ORAL_TABLET | Freq: Four times a day (QID) | ORAL | Status: DC | PRN

## 2013-09-03 MED ORDER — IBUPROFEN 600 MG PO TABS
600.0000 mg | ORAL_TABLET | Freq: Three times a day (TID) | ORAL | Status: DC
  Administered 2013-09-03: 600 mg via ORAL
  Filled 2013-09-03 (×2): qty 1

## 2013-09-03 MED ORDER — KETOROLAC TROMETHAMINE 15 MG/ML IJ SOLN
30.0000 mg | Freq: Once | INTRAMUSCULAR | Status: AC
  Administered 2013-09-03: 30 mg via INTRAVENOUS
  Filled 2013-09-03: qty 2

## 2013-09-03 MED ORDER — OXYCODONE-ACETAMINOPHEN 5-325 MG PO TABS: 1.0000 | ORAL_TABLET | ORAL | Status: DC | PRN

## 2013-09-03 MED ORDER — IBUPROFEN 600 MG PO TABS: 600.0000 mg | ORAL_TABLET | Freq: Three times a day (TID) | ORAL | Status: DC

## 2013-09-03 NOTE — Progress Notes (Signed)
Discharge home. Home discharge instruction given, no question verbalized. 

## 2013-09-03 NOTE — Discharge Summary (Signed)
Physician Discharge Summary  Emma Mathews ZOX:[id number] DOB: February 20, 1976 DOA: 09/02/2013  PCP: Pcp Not In System  Consultation:  Admit date: 09/02/2013 Discharge date: 09/03/2013  Recommendations for Outpatient Follow-up:    Follow-up Information   Follow up with [facility] On 10/02/2013. (arrive by 1:30PM for a 2PM appointment)    Contact information:   [address]   [place] [address] [phone]      Discharge Diagnoses:  1. Acute appendicitis   Surgical Procedure: laparoscopic appendectomy(Dr. Magnus Ivan 09/02/13)  Discharge Condition: stable Disposition: home  Diet recommendation: regular  Filed Vitals:   09/03/13 1326  BP: 111/57  Pulse: 74  Temp: 98.3 F (36.8 C)  Resp: 16    Hospital Course:  Tinea Nobile is a healthy 37 year old female who presented to [facility] ED with RLQ abdominal pain.  Shew as found to have acute appendicitis and underwent the procedure listed above. Post operatively she had nausea caused by pain medication.  She was given toradol and started on Ibuprofen, her symptoms improved.  Her vital signs remained stable.  She was given atbx preoperatively.  On POD #1 she was passing flatus, tolerating a diet, pain under good control, ambulating in hallways and therefore felt stable for discharge.  We discussed warning signs that warrant urgent referral.  She was provided with a follow up in our office.  She was encouraged to call with questions or concerns.     Discharge Instructions   Future Appointments Provider Department Dept Phone   10/02/2013 2:00 PM [facility], [place] [phone]       Medication List         ibuprofen 600 MG tablet  Commonly known as:  ADVIL,MOTRIN  Take 1 tablet (600 mg total) by mouth 3 (three) times daily with meals.     mometasone 50 MCG/ACT nasal spray  Commonly known as:  NASONEX  Place 2 sprays into the nose daily as needed (for  allergies).     oxyCODONE-acetaminophen 5-325 MG per tablet  Commonly known as:  PERCOCET/ROXICET  Take 1-2 tablets by mouth every 4 (four) hours as needed.     promethazine 12.5 MG tablet  Commonly known as:  PHENERGAN  Take 1-2 tablets (12.5-25 mg total) by mouth every 6 (six) hours as needed for nausea.           Follow-up Information   Follow up with [facility] On 10/02/2013. (arrive by 1:30PM for a 2PM appointment)    Contact information:   [address]   [place] [address] [phone]        The results of significant diagnostics from this hospitalization (including imaging, microbiology, ancillary and laboratory) are listed below for reference.    Significant Diagnostic Studies: Ct Abdomen Pelvis W Contrast  09/02/2013   CLINICAL DATA:  Right lower quadrant pain  EXAM: CT ABDOMEN AND PELVIS WITH CONTRAST  TECHNIQUE: Multidetector CT imaging of the abdomen and pelvis was performed using the standard protocol following bolus administration of intravenous contrast.  CONTRAST:  OMNIPAQUE IOHEXOL 300 MG/ML  SOLN  COMPARISON:  07/01/2011  FINDINGS: The appendix is enhancing and thickened. It does not contain gas. There is slight stranding in the adjacent fat. Findings are compatible with early acute appendicitis.  2.1 cm cystic lesion in the left adnexa. Previously noted right adnexal cystic lesion has resolved.  Postcholecystectomy.  Spleen, kidneys, pancreas are stable in appearance.  No free-fluid. No extraluminal bowel gas.  IUD is in place.  Left over an vein is prominent and is contrast-enhancing worrisome for venous reflux. At least to small left-sided pelvic varices.  No vertebral compression deformity. Severe narrowing of the L4-5 disc.  IMPRESSION: Findings are compatible with early acute appendicitis.  Critical Value/emergent results were called by telephone at the time of interpretation on 09/02/2013 at 12:43 PM to [facility] ,  who verbally acknowledged these results.  2.1 cm left adnexal cystic lesion. This is likely benign. Consider 6-12 week followup to ensure resolution.   Electronically Signed   By: Maryclare Bean M.D.   On: 09/02/2013 12:43    Microbiology: Recent Results (from the past 240 hour(s))  MRSA PCR SCREENING     Status: None   Collection Time    09/02/13  3:24 PM      Result Value Range Status   MRSA by PCR NEGATIVE  NEGATIVE Final   Comment:            The GeneXpert MRSA Assay (FDA     approved for NASAL specimens     only), is one component of a     comprehensive MRSA colonization     surveillance program. It is not     intended to diagnose MRSA     infection nor to guide or     monitor treatment for     MRSA infections.     Labs: Basic Metabolic Panel:  Recent Labs Lab 09/02/13 1025  NA 135  K 5.6*  CL 100  CO2 25  GLUCOSE 89  BUN 12  CREATININE 0.68  CALCIUM 9.4   Liver Function Tests:  Recent Labs Lab 09/02/13 1025  AST 45*  ALT 22  ALKPHOS 86  BILITOT 0.6  PROT 8.4*  ALBUMIN 4.3   No results found for this basename: LIPASE, AMYLASE,  in the last 168 hours No results found for this basename: AMMONIA,  in the last 168 hours CBC:  Recent Labs Lab 09/02/13 1025  WBC 7.4  NEUTROABS 4.4  HGB 15.2*  HCT 43.1  MCV 84.7  PLT 215    Signed:  Noah Lembke, ANP-BC

## 2013-09-03 NOTE — Progress Notes (Signed)
1 Day Post-Op  Subjective: C/o nausea after taking pain medication.  Tolerating liquids.  Voiding well, flatus, no bm.  Ambulated to bathroom.    Objective: Vital signs in last 24 hours: Temp:  [97.5 F (36.4 C)-98.1 F (36.7 C)] 98 F (36.7 C) (10/20 0634) Pulse Rate:  [55-88] 56 (10/20 0634) Resp:  [15-21] 18 (10/20 0634) BP: (99-119)/(47-80) 99/54 mmHg (10/20 0634) SpO2:  [99 %-100 %] 99 % (10/20 0634) Last BM Date: 08/31/13  Intake/Output from previous day: 10/19 0701 - 10/20 0700 In: 1240 [P.O.:240; I.V.:1000] Out: 10 [Blood:10] Intake/Output this shift:    General appearance: alert, cooperative, appears stated age and no distress GI: +BS abdomen is soft round and appropriately tender.  incisions are clean dry and intact.  Lab Results:   Recent Labs  09/02/13 1025  WBC 7.4  HGB 15.2*  HCT 43.1  PLT 215   BMET  Recent Labs  09/02/13 1025  NA 135  K 5.6*  CL 100  CO2 25  GLUCOSE 89  BUN 12  CREATININE 0.68  CALCIUM 9.4   Studies/Results: Ct Abdomen Pelvis W Contrast  09/02/2013   CLINICAL DATA:  Right lower quadrant pain  EXAM: CT ABDOMEN AND PELVIS WITH CONTRAST  TECHNIQUE: Multidetector CT imaging of the abdomen and pelvis was performed using the standard protocol following bolus administration of intravenous contrast.  CONTRAST:  OMNIPAQUE IOHEXOL 300 MG/ML  SOLN  COMPARISON:  07/01/2011  FINDINGS: The appendix is enhancing and thickened. It does not contain gas. There is slight stranding in the adjacent fat. Findings are compatible with early acute appendicitis.  2.1 cm cystic lesion in the left adnexa. Previously noted right adnexal cystic lesion has resolved.  Postcholecystectomy.  Spleen, kidneys, pancreas are stable in appearance.  No free-fluid. No extraluminal bowel gas.  IUD is in place.  Left over an vein is prominent and is contrast-enhancing worrisome for venous reflux. At least to small left-sided pelvic varices.  No vertebral compression  deformity. Severe narrowing of the L4-5 disc.  IMPRESSION: Findings are compatible with early acute appendicitis.  Critical Value/emergent results were called by telephone at the time of interpretation on 09/02/2013 at 12:43 PM to [facility] , who verbally acknowledged these results.  2.1 cm left adnexal cystic lesion. This is likely benign. Consider 6-12 week followup to ensure resolution.   Electronically Signed   By: Maryclare Bean M.D.   On: 09/02/2013 12:43    Anti-infectives: Anti-infectives   Start     Dose/Rate Route Frequency Ordered Stop   09/02/13 1400  piperacillin-tazobactam (ZOSYN) IVPB 3.375 g  Status:  Discontinued     3.375 g 12.5 mL/hr over 240 Minutes Intravenous 3 times per day 09/02/13 1340 09/03/13 0757      Assessment/Plan: Acute appendicitis s/p Lap Appy(Dr. Magnus Ivan) POD#1 -Give toradol x1 dose, oral pain medication -advance diet as tolerated -mobililize -VTE prophylaxis -discharge later this AM   LOS: 1 day    Tamarra Geiselman, [facility] 09/03/2013

## 2013-09-03 NOTE — Progress Notes (Signed)
Patient interviewed and examined, agree with PA note above.  Santos Hardwick T Arieon Scalzo MD, FACS  09/03/2013 9:56 AM  

## 2013-10-02 ENCOUNTER — Encounter (INDEPENDENT_AMBULATORY_CARE_PROVIDER_SITE_OTHER): Payer: Self-pay

## 2013-10-02 ENCOUNTER — Ambulatory Visit (INDEPENDENT_AMBULATORY_CARE_PROVIDER_SITE_OTHER): Payer: Commercial Managed Care - PPO | Admitting: General Surgery

## 2013-10-02 VITALS — BP 122/74 | HR 70 | Resp 18 | Ht 67.5 in | Wt 190.0 lb

## 2013-10-02 DIAGNOSIS — K3580 Unspecified acute appendicitis: Secondary | ICD-10-CM | POA: Insufficient documentation

## 2013-10-02 DIAGNOSIS — Z9889 Other specified postprocedural states: Secondary | ICD-10-CM

## 2013-10-02 DIAGNOSIS — Z9049 Acquired absence of other specified parts of digestive tract: Secondary | ICD-10-CM

## 2013-10-02 HISTORY — DX: Unspecified acute appendicitis: K35.80

## 2013-10-02 NOTE — Progress Notes (Signed)
  Subjective: Emma Mathews is a 37 y.o. female who had a laparoscopic appendectomy with on 09/02/13 by Dr. Magnus Ivan.  The pathology report confirmed Acute full thickeness appendicitis and serositis, no tumor seen.  The patient is tolerating their diet well and is having no severe pain.  Bowel function is good.  No problems with the wounds.  Pt is returning to normal activity / work.  Notes that she doesn't have much of an appetite even now 4 weeks out from surgery.     Objective: Vital signs in last 24 hours: Reviewed   PE: General:  Alert, NAD, pleasant Abd: soft, NT/ND, +bs, incisions appear well-healed with no sign of infection or bleeding.     Assessment/Plan  1.  S/P Laparoscopic Appendectomy: doing well, may resume regular activity without restrictions, Pt will follow up with [place] PRN and knows to call with questions or concerns.   2.  Anorexia:  Monitor for the next 2 weeks and if not improved call her PCP for further workup   Aris Georgia, PA-C 10/02/2013

## 2013-10-02 NOTE — Patient Instructions (Signed)
She may resume a regular diet and full activity.  She may follow-up on a PRN basis.  Follow up with your PCP if your low appetite doesn't pick up.

## 2013-11-20 ENCOUNTER — Ambulatory Visit (INDEPENDENT_AMBULATORY_CARE_PROVIDER_SITE_OTHER): Payer: 59 | Admitting: Family Medicine

## 2013-11-20 VITALS — BP 126/78 | HR 69 | Temp 98.9°F | Resp 17 | Ht 66.0 in | Wt 195.0 lb

## 2013-11-20 DIAGNOSIS — J329 Chronic sinusitis, unspecified: Secondary | ICD-10-CM

## 2013-11-20 MED ORDER — AMOXICILLIN 875 MG PO TABS: 875.0000 mg | ORAL_TABLET | Freq: Two times a day (BID) | ORAL | Status: DC

## 2013-11-20 NOTE — Progress Notes (Signed)
Subjective: Patient had the flu in December, couple of weeks ago. She was the last of her family to get it. She's got over that period was well for a few days and then started getting more head congestion. She had some sore throat. She just felt bad. She has little cough. This is been going on for 4 days. Her ears bother her some.  Objective: TMs are normal. Throat not erythematous. Neck supple without significant nodes. Chest is clear. Heart regular without murmurs. Mild sinus tenderness.  Assessment: Probable sinusitis, secondary to having had the flu  Plan: Amoxicillin 875 mg twice a day OTC symptomatic treatment  Return if worse

## 2013-11-20 NOTE — Patient Instructions (Signed)
Drink plenty of fluids  Get enough rest  Take the amoxicillin one twice daily  Return if worse

## 2015-01-20 ENCOUNTER — Ambulatory Visit (INDEPENDENT_AMBULATORY_CARE_PROVIDER_SITE_OTHER): Payer: 59 | Admitting: Physician Assistant

## 2015-01-20 VITALS — BP 112/70 | HR 84 | Temp 97.6°F | Resp 16 | Wt 194.8 lb

## 2015-01-20 DIAGNOSIS — R05 Cough: Secondary | ICD-10-CM | POA: Diagnosis not present

## 2015-01-20 DIAGNOSIS — R0981 Nasal congestion: Secondary | ICD-10-CM | POA: Diagnosis not present

## 2015-01-20 DIAGNOSIS — R059 Cough, unspecified: Secondary | ICD-10-CM

## 2015-01-20 DIAGNOSIS — J0140 Acute pansinusitis, unspecified: Secondary | ICD-10-CM | POA: Diagnosis not present

## 2015-01-20 MED ORDER — AMOXICILLIN-POT CLAVULANATE 875-125 MG PO TABS: 1.0000 | ORAL_TABLET | Freq: Two times a day (BID) | ORAL | Status: AC

## 2015-01-20 MED ORDER — IPRATROPIUM BROMIDE 0.03 % NA SOLN: 2.0000 | Freq: Two times a day (BID) | NASAL | Status: DC

## 2015-01-20 MED ORDER — BENZONATATE 100 MG PO CAPS: 100.0000 mg | ORAL_CAPSULE | Freq: Three times a day (TID) | ORAL | Status: AC | PRN

## 2015-01-20 MED ORDER — GUAIFENESIN ER 1200 MG PO TB12: 1.0000 | ORAL_TABLET | Freq: Two times a day (BID) | ORAL | Status: DC | PRN

## 2015-01-20 MED ORDER — PROMETHAZINE-DM 6.25-15 MG/5ML PO SYRP: 5.0000 mL | ORAL_SOLUTION | Freq: Four times a day (QID) | ORAL | Status: AC | PRN

## 2015-01-20 NOTE — Patient Instructions (Signed)
Please stay hydrated drinking at least 64oz per day (4 regular water bottles) Use the mucinex at least once during the day.     Sinusitis Sinusitis is redness, soreness, and inflammation of the paranasal sinuses. Paranasal sinuses are air pockets within the bones of your face (beneath the eyes, the middle of the forehead, or above the eyes). In healthy paranasal sinuses, mucus is able to drain out, and air is able to circulate through them by way of your nose. However, when your paranasal sinuses are inflamed, mucus and air can become trapped. This can allow bacteria and other germs to grow and cause infection. Sinusitis can develop quickly and last only a short time (acute) or continue over a long period (chronic). Sinusitis that lasts for more than 12 weeks is considered chronic.  CAUSES  Causes of sinusitis include:  Allergies.  Structural abnormalities, such as displacement of the cartilage that separates your nostrils (deviated septum), which can decrease the air flow through your nose and sinuses and affect sinus drainage.  Functional abnormalities, such as when the small hairs (cilia) that line your sinuses and help remove mucus do not work properly or are not present. SIGNS AND SYMPTOMS  Symptoms of acute and chronic sinusitis are the same. The primary symptoms are pain and pressure around the affected sinuses. Other symptoms include:  Upper toothache.  Earache.  Headache.  Bad breath.  Decreased sense of smell and taste.  A cough, which worsens when you are lying flat.  Fatigue.  Fever.  Thick drainage from your nose, which often is green and may contain pus (purulent).  Swelling and warmth over the affected sinuses. DIAGNOSIS  Your health care provider will perform a physical exam. During the exam, your health care provider may:  Look in your nose for signs of abnormal growths in your nostrils (nasal polyps).  Tap over the affected sinus to check for signs of  infection.  View the inside of your sinuses (endoscopy) using an imaging device that has a light attached (endoscope). If your health care provider suspects that you have chronic sinusitis, one or more of the following tests may be recommended:  Allergy tests.  Nasal culture. A sample of mucus is taken from your nose, sent to a lab, and screened for bacteria.  Nasal cytology. A sample of mucus is taken from your nose and examined by your health care provider to determine if your sinusitis is related to an allergy. TREATMENT  Most cases of acute sinusitis are related to a viral infection and will resolve on their own within 10 days. Sometimes medicines are prescribed to help relieve symptoms (pain medicine, decongestants, nasal steroid sprays, or saline sprays).  However, for sinusitis related to a bacterial infection, your health care provider will prescribe antibiotic medicines. These are medicines that will help kill the bacteria causing the infection.  Rarely, sinusitis is caused by a fungal infection. In theses cases, your health care provider will prescribe antifungal medicine. For some cases of chronic sinusitis, surgery is needed. Generally, these are cases in which sinusitis recurs more than 3 times per year, despite other treatments. HOME CARE INSTRUCTIONS   Drink plenty of water. Water helps thin the mucus so your sinuses can drain more easily.  Use a humidifier.  Inhale steam 3 to 4 times a day (for example, sit in the bathroom with the shower running).  Apply a warm, moist washcloth to your face 3 to 4 times a day, or as directed by your health  care provider.  Use saline nasal sprays to help moisten and clean your sinuses.  Take medicines only as directed by your health care provider.  If you were prescribed either an antibiotic or antifungal medicine, finish it all even if you start to feel better. SEEK IMMEDIATE MEDICAL CARE IF:  You have increasing pain or severe  headaches.  You have nausea, vomiting, or drowsiness.  You have swelling around your face.  You have vision problems.  You have a stiff neck.  You have difficulty breathing. MAKE SURE YOU:   Understand these instructions.  Will watch your condition.  Will get help right away if you are not doing well or get worse. Document Released: 11/01/2005 Document Revised: 03/18/2014 Document Reviewed: 11/16/2011 [facility] Patient Information 2015 [place], [place]. This information is not intended to replace advice given to you by your health care provider. Make sure you discuss any questions you have with your health care provider.

## 2015-01-20 NOTE — Progress Notes (Signed)
[facility] [address], [place] [address] [phone]  Date:  01/20/2015   Name:  Emma Mathews   DOB:  February 02, 1976   MRN:  [id number]  PCP:  Robyn Haber, MD    Chief Complaint: Nasal Congestion   History of Present Illness:  Emma Mathews is a 39 y.o. very pleasant female patient who presents with the following: Patient reports 2.5 weeks of nasal congestion of both nostrils.  This has been consistent throughout the weeks, which has included an intermittent productive cough of green sputum.  Coughing worsens at night while lying down.  This is consistent with nasal mucus as well.  Patient reports losing her voice about 5 days ago.  She has some tenderness at the maxillary and frontal sinus.  She has fatigued and last layed about for the last 2 days.  She also has a popping sensation in her ears 2 days ago, but this has since resolved.  She denies dyspnea, sob, or fever.  She denies all GI symptoms of abdominal pain, nausea, vomiting, or diarrhea.    Patient Active Problem List   Diagnosis Date Noted  . Appendicitis, acute 10/02/2013  . ADD 10/28/2009  . ANEMIA-UNSPECIFIED 04/28/2009  . STRESS FRACTURE, FOOT 04/08/2009  . ADULT SITUATIONAL REACTION 02/25/2009  . ALLERGIC RHINITIS 02/25/2009    Past Medical History  Diagnosis Date  . Allergy     Past Surgical History  Procedure Laterality Date  . Cholecystectomy    . Laparoscopic appendectomy N/A 09/02/2013    Procedure: APPENDECTOMY LAPAROSCOPIC;  Surgeon: Harl Bowie, MD;  Location: [place];  Service: General;  Laterality: N/A;  . Appendectomy    . Eye surgery      History  Substance Use Topics  . Smoking status: Never Smoker   . Smokeless tobacco: Not on file  . Alcohol Use: No    History reviewed. No pertinent family history.  Allergies  Allergen Reactions  . Diphenhydramine Hcl Hives and Other (See Comments)    Pt states she can take Dye free Benadryl  . Codeine  Hives and Nausea And Vomiting  . Contrast Media [Iodinated Diagnostic Agents] Nausea And Vomiting    Medication list has been reviewed and updated.  Current Outpatient Prescriptions on File Prior to Visit  Medication Sig Dispense Refill  . mometasone (NASONEX) 50 MCG/ACT nasal spray Place 2 sprays into the nose daily as needed (for allergies).    Marland Kitchen amoxicillin (AMOXIL) 875 MG tablet Take 1 tablet (875 mg total) by mouth 2 (two) times daily. (Patient not taking: Reported on 01/20/2015) 20 tablet 0  . ibuprofen (ADVIL,MOTRIN) 600 MG tablet Take 1 tablet (600 mg total) by mouth 3 (three) times daily with meals. (Patient not taking: Reported on 01/20/2015) 30 tablet 0  . oxyCODONE-acetaminophen (PERCOCET/ROXICET) 5-325 MG per tablet Take 1-2 tablets by mouth every 4 (four) hours as needed. (Patient not taking: Reported on 01/20/2015) 30 tablet 0  . promethazine (PHENERGAN) 12.5 MG tablet Take 1-2 tablets (12.5-25 mg total) by mouth every 6 (six) hours as needed for nausea. (Patient not taking: Reported on 01/20/2015) 20 tablet 0   No current facility-administered medications on file prior to visit.    Review of Systems: ROS otherwise unremarkable unless listed above.    Physical Examination: Filed Vitals:   01/20/15 0824  BP: 112/70  Pulse: 84  Temp: 97.6 F (36.4 C)  Resp: 16   Filed Vitals:   01/20/15 0824  Weight: 194 lb  12.8 oz (88.361 kg)   Body mass index is 31.46 kg/(m^2). Ideal Body Weight:   Physical Exam  Constitutional: She appears well-developed and well-nourished. No distress.  HENT:  Head: Normocephalic and atraumatic.  Right Ear: Tympanic membrane, external ear and ear canal normal.  Left Ear: External ear and ear canal normal. A middle ear effusion (Serous at 4 o'clock outer region) is present.  Nose: Mucosal edema and rhinorrhea present. Right sinus exhibits maxillary sinus tenderness and frontal sinus tenderness. Left sinus exhibits maxillary sinus tenderness and  frontal sinus tenderness.  Mouth/Throat: No oropharyngeal exudate, posterior oropharyngeal edema or posterior oropharyngeal erythema.  Eyes: Pupils are equal, round, and reactive to light. Right conjunctiva is not injected. Right conjunctiva has no hemorrhage. Left conjunctiva is not injected. Left conjunctiva has no hemorrhage. Right eye exhibits normal extraocular motion. Left eye exhibits normal extraocular motion.  Cardiovascular: Normal rate, regular rhythm, normal heart sounds and intact distal pulses.  Exam reveals no gallop, no distant heart sounds and no friction rub.   No murmur heard. Pulmonary/Chest: Breath sounds normal. No apnea. No respiratory distress. She has no decreased breath sounds. She has no wheezes.  Lymphadenopathy:       Head (right side): No submandibular, no tonsillar, no preauricular, no posterior auricular and no occipital adenopathy present.       Head (left side): No submandibular, no tonsillar, no preauricular, no posterior auricular and no occipital adenopathy present.    She has no cervical adenopathy.  Skin: Skin is warm and dry. She is not diaphoretic.  Psychiatric: She has a normal mood and affect. Her behavior is normal.    Assessment and Plan: 39 year old female is here today with 2.5 weeks of nasal congestion with hx and exam consistent with a sinus infection.  Treating possible bacterial etiology.  Patient will return to clinic if sxs do not resolve.  Advised to take the otc claritin/allegra/or zyrtec consistently as well.  Subacute pansinusitis - Plan: amoxicillin-clavulanate (AUGMENTIN) 875-125 MG per tablet, ipratropium (ATROVENT) 0.03 % nasal spray, Guaifenesin (MUCINEX MAXIMUM STRENGTH) 1200 MG TB12  Cough - Plan: benzonatate (TESSALON) 100 MG capsule, promethazine-dextromethorphan (PROMETHAZINE-DM) 6.25-15 MG/5ML syrup  Nasal congestion - Plan: ipratropium (ATROVENT) 0.03 % nasal spray, Guaifenesin (MUCINEX MAXIMUM STRENGTH) 1200 MG  TB12  Ivar Drape, PA-C [facility] 3/7/20169:18 AM

## 2015-11-21 DIAGNOSIS — M6281 Muscle weakness (generalized): Secondary | ICD-10-CM | POA: Diagnosis not present

## 2015-11-21 DIAGNOSIS — M25511 Pain in right shoulder: Secondary | ICD-10-CM | POA: Diagnosis not present

## 2015-11-24 DIAGNOSIS — M25511 Pain in right shoulder: Secondary | ICD-10-CM | POA: Diagnosis not present

## 2015-11-24 DIAGNOSIS — M6281 Muscle weakness (generalized): Secondary | ICD-10-CM | POA: Diagnosis not present

## 2015-11-28 DIAGNOSIS — M6281 Muscle weakness (generalized): Secondary | ICD-10-CM | POA: Diagnosis not present

## 2015-11-28 DIAGNOSIS — M25511 Pain in right shoulder: Secondary | ICD-10-CM | POA: Diagnosis not present

## 2016-01-29 DIAGNOSIS — D225 Melanocytic nevi of trunk: Secondary | ICD-10-CM | POA: Diagnosis not present

## 2016-01-29 DIAGNOSIS — Z85820 Personal history of malignant melanoma of skin: Secondary | ICD-10-CM | POA: Diagnosis not present

## 2016-01-29 DIAGNOSIS — D485 Neoplasm of uncertain behavior of skin: Secondary | ICD-10-CM | POA: Diagnosis not present

## 2016-01-29 DIAGNOSIS — D2222 Melanocytic nevi of left ear and external auricular canal: Secondary | ICD-10-CM | POA: Diagnosis not present

## 2016-02-10 DIAGNOSIS — M25511 Pain in right shoulder: Secondary | ICD-10-CM | POA: Diagnosis not present

## 2016-02-10 DIAGNOSIS — D2222 Melanocytic nevi of left ear and external auricular canal: Secondary | ICD-10-CM | POA: Diagnosis not present

## 2016-02-10 DIAGNOSIS — D485 Neoplasm of uncertain behavior of skin: Secondary | ICD-10-CM | POA: Diagnosis not present

## 2016-02-11 ENCOUNTER — Other Ambulatory Visit (HOSPITAL_COMMUNITY): Payer: Self-pay | Admitting: Orthopedic Surgery

## 2016-02-11 DIAGNOSIS — M25511 Pain in right shoulder: Secondary | ICD-10-CM

## 2016-02-13 ENCOUNTER — Telehealth: Payer: Self-pay | Admitting: Radiology

## 2016-02-13 MED FILL — predniSONE 50 MG TABS: 50 | 2 days supply | Qty: 3 | Fill #0

## 2016-02-13 NOTE — Progress Notes (Signed)
   Pt scheduled for MRI arthrogram Wed 02/18/16  1000 am injection time  Allergic to contrast Needs pre meds per radiologist  Called Pred 50 mg #3            Benadryl 50 mg #1  To [place] 3/31  110 pm  Pt aware

## 2016-02-18 ENCOUNTER — Ambulatory Visit (HOSPITAL_COMMUNITY)
Admission: RE | Admit: 2016-02-18 | Discharge: 2016-02-18 | Disposition: A | Discharge status: Home / Self Care | Payer: 59 | Source: Ambulatory Visit | Attending: Orthopedic Surgery | Admitting: Orthopedic Surgery

## 2016-02-18 DIAGNOSIS — M25511 Pain in right shoulder: Secondary | ICD-10-CM

## 2016-02-18 DIAGNOSIS — M7581 Other shoulder lesions, right shoulder: Secondary | ICD-10-CM | POA: Diagnosis not present

## 2016-02-18 MED ORDER — GADOBENATE DIMEGLUMINE 529 MG/ML IV SOLN
5.0000 mL | Freq: Once | INTRAVENOUS | Status: AC | PRN
  Administered 2016-02-18: 0.1 mL via INTRA_ARTICULAR

## 2016-02-18 MED ORDER — IOHEXOL 180 MG/ML  SOLN
20.0000 mL | Freq: Once | INTRAMUSCULAR | Status: AC | PRN
  Administered 2016-02-18: 15 mL via INTRA_ARTICULAR

## 2016-02-18 MED ORDER — LIDOCAINE HCL (PF) 1 % IJ SOLN
INTRAMUSCULAR | Status: AC
  Administered 2016-02-18: 5 mL via INTRAMUSCULAR
  Filled 2016-02-18: qty 5

## 2016-02-18 MED ORDER — LIDOCAINE HCL (PF) 1 % IJ SOLN
INTRAMUSCULAR | Status: AC
Start: 2016-02-18 — End: 2016-02-18
  Administered 2016-02-18: 2 mL via INTRAMUSCULAR
  Filled 2016-02-18: qty 5

## 2016-02-20 ENCOUNTER — Other Ambulatory Visit: Payer: Self-pay | Admitting: Orthopedic Surgery

## 2016-02-20 DIAGNOSIS — M25511 Pain in right shoulder: Secondary | ICD-10-CM | POA: Diagnosis not present

## 2016-02-23 ENCOUNTER — Encounter (HOSPITAL_COMMUNITY): Payer: Self-pay | Admitting: *Deleted

## 2016-02-24 ENCOUNTER — Encounter (HOSPITAL_COMMUNITY): Payer: Self-pay | Admitting: Certified Registered Nurse Anesthetist

## 2016-02-24 ENCOUNTER — Encounter (HOSPITAL_COMMUNITY)
Admission: RE | Disposition: A | Discharge status: Home / Self Care | Payer: Self-pay | Source: Ambulatory Visit | Attending: Orthopedic Surgery

## 2016-02-24 ENCOUNTER — Ambulatory Visit (HOSPITAL_COMMUNITY): Payer: 59 | Admitting: Certified Registered Nurse Anesthetist

## 2016-02-24 ENCOUNTER — Ambulatory Visit (HOSPITAL_COMMUNITY)
Admission: RE | Admit: 2016-02-24 | Discharge: 2016-02-24 | Disposition: A | Discharge status: Home / Self Care | Payer: 59 | Source: Ambulatory Visit | Attending: Orthopedic Surgery | Admitting: Orthopedic Surgery

## 2016-02-24 DIAGNOSIS — M75110 Incomplete rotator cuff tear or rupture of unspecified shoulder, not specified as traumatic: Secondary | ICD-10-CM | POA: Diagnosis not present

## 2016-02-24 DIAGNOSIS — M7551 Bursitis of right shoulder: Secondary | ICD-10-CM | POA: Diagnosis not present

## 2016-02-24 DIAGNOSIS — X58XXXA Exposure to other specified factors, initial encounter: Secondary | ICD-10-CM | POA: Insufficient documentation

## 2016-02-24 DIAGNOSIS — S43431D Superior glenoid labrum lesion of right shoulder, subsequent encounter: Secondary | ICD-10-CM | POA: Diagnosis not present

## 2016-02-24 DIAGNOSIS — S43431A Superior glenoid labrum lesion of right shoulder, initial encounter: Secondary | ICD-10-CM | POA: Diagnosis not present

## 2016-02-24 DIAGNOSIS — G8918 Other acute postprocedural pain: Secondary | ICD-10-CM | POA: Diagnosis not present

## 2016-02-24 HISTORY — DX: Adverse effect of unspecified anesthetic, initial encounter: T41.45XA

## 2016-02-24 HISTORY — DX: Other seasonal allergic rhinitis: J30.2

## 2016-02-24 HISTORY — DX: Other complications of anesthesia, initial encounter: T88.59XA

## 2016-02-24 HISTORY — PX: SHOULDER ARTHROSCOPY WITH SUBACROMIAL DECOMPRESSION, ROTATOR CUFF REPAIR AND BICEP TENDON REPAIR: SHX5687

## 2016-02-24 HISTORY — DX: Other specified postprocedural states: R11.2

## 2016-02-24 HISTORY — DX: Other specified postprocedural states: Z98.890

## 2016-02-24 HISTORY — DX: Headache, unspecified: R51.9

## 2016-02-24 HISTORY — DX: Pneumonia, unspecified organism: J18.9

## 2016-02-24 HISTORY — DX: Headache: R51

## 2016-02-24 LAB — HCG, SERUM, QUALITATIVE: PREG SERUM: NEGATIVE

## 2016-02-24 LAB — HEMOGLOBIN: HEMOGLOBIN: 12.9 g/dL (ref 12.0–15.0)

## 2016-02-24 SURGERY — SHOULDER ARTHROSCOPY WITH SUBACROMIAL DECOMPRESSION, ROTATOR CUFF REPAIR AND BICEP TENDON REPAIR
Anesthesia: General | Site: Shoulder | Laterality: Right

## 2016-02-24 MED ORDER — LIDOCAINE HCL (CARDIAC) 20 MG/ML IV SOLN
INTRAVENOUS | Status: AC
  Filled 2016-02-24: qty 5

## 2016-02-24 MED ORDER — NEOSTIGMINE METHYLSULFATE 10 MG/10ML IV SOLN
INTRAVENOUS | Status: AC
  Filled 2016-02-24: qty 1

## 2016-02-24 MED ORDER — CHLORHEXIDINE GLUCONATE 4 % EX LIQD: 60.0000 mL | Freq: Once | CUTANEOUS | Status: DC

## 2016-02-24 MED ORDER — GLYCOPYRROLATE 0.2 MG/ML IJ SOLN
INTRAMUSCULAR | Status: AC
  Filled 2016-02-24: qty 1

## 2016-02-24 MED ORDER — METOCLOPRAMIDE HCL 5 MG/ML IJ SOLN
10.0000 mg | Freq: Once | INTRAMUSCULAR | Status: AC | PRN
  Administered 2016-02-24: 10 mg via INTRAVENOUS

## 2016-02-24 MED ORDER — FENTANYL CITRATE (PF) 250 MCG/5ML IJ SOLN
INTRAMUSCULAR | Status: AC
  Filled 2016-02-24: qty 5

## 2016-02-24 MED ORDER — ONDANSETRON HCL 4 MG/2ML IJ SOLN
INTRAMUSCULAR | Status: AC
  Filled 2016-02-24: qty 2

## 2016-02-24 MED ORDER — GLYCOPYRROLATE 0.2 MG/ML IJ SOLN
INTRAMUSCULAR | Status: AC
  Filled 2016-02-24: qty 2

## 2016-02-24 MED ORDER — ONDANSETRON HCL 4 MG/2ML IJ SOLN
INTRAMUSCULAR | Status: DC | PRN
  Administered 2016-02-24 (×2): 4 mg via INTRAVENOUS

## 2016-02-24 MED ORDER — PROPOFOL 10 MG/ML IV BOLUS
INTRAVENOUS | Status: AC
  Filled 2016-02-24: qty 20

## 2016-02-24 MED ORDER — CEFAZOLIN SODIUM-DEXTROSE 2-4 GM/100ML-% IV SOLN
2.0000 g | INTRAVENOUS | Status: AC
  Administered 2016-02-24: 2 g via INTRAVENOUS
  Filled 2016-02-24: qty 100

## 2016-02-24 MED ORDER — LIDOCAINE HCL (CARDIAC) 20 MG/ML IV SOLN
INTRAVENOUS | Status: DC | PRN
  Administered 2016-02-24: 100 mg via INTRAVENOUS

## 2016-02-24 MED ORDER — MEPERIDINE HCL 25 MG/ML IJ SOLN: 6.2500 mg | INTRAMUSCULAR | Status: DC | PRN

## 2016-02-24 MED ORDER — FENTANYL CITRATE (PF) 100 MCG/2ML IJ SOLN
INTRAMUSCULAR | Status: DC | PRN
Start: 2016-02-24 — End: 2016-02-24
  Administered 2016-02-24 (×5): 50 ug via INTRAVENOUS

## 2016-02-24 MED ORDER — DEXAMETHASONE SODIUM PHOSPHATE 10 MG/ML IJ SOLN
INTRAMUSCULAR | Status: DC | PRN
  Administered 2016-02-24: 10 mg via INTRAVENOUS

## 2016-02-24 MED ORDER — SCOPOLAMINE 1 MG/3DAYS TD PT72
MEDICATED_PATCH | TRANSDERMAL | Status: AC
  Administered 2016-02-24: 1 via TRANSDERMAL
  Filled 2016-02-24: qty 1

## 2016-02-24 MED ORDER — 0.9 % SODIUM CHLORIDE (POUR BTL) OPTIME
TOPICAL | Status: DC | PRN
  Administered 2016-02-24: 1000 mL

## 2016-02-24 MED ORDER — MIDAZOLAM HCL 2 MG/2ML IJ SOLN
2.0000 mg | Freq: Once | INTRAMUSCULAR | Status: AC
  Administered 2016-02-24: 2 mg via INTRAVENOUS

## 2016-02-24 MED ORDER — MIDAZOLAM HCL 2 MG/2ML IJ SOLN
INTRAMUSCULAR | Status: AC
  Administered 2016-02-24: 2 mg via INTRAVENOUS
  Filled 2016-02-24: qty 2

## 2016-02-24 MED ORDER — ROCURONIUM BROMIDE 100 MG/10ML IV SOLN
INTRAVENOUS | Status: DC | PRN
  Administered 2016-02-24: 40 mg via INTRAVENOUS

## 2016-02-24 MED ORDER — PHENYLEPHRINE 40 MCG/ML (10ML) SYRINGE FOR IV PUSH (FOR BLOOD PRESSURE SUPPORT)
PREFILLED_SYRINGE | INTRAVENOUS | Status: AC
  Filled 2016-02-24: qty 10

## 2016-02-24 MED ORDER — FENTANYL CITRATE (PF) 100 MCG/2ML IJ SOLN: 25.0000 ug | INTRAMUSCULAR | Status: DC | PRN

## 2016-02-24 MED ORDER — BUPIVACAINE-EPINEPHRINE (PF) 0.5% -1:200000 IJ SOLN
INTRAMUSCULAR | Status: DC | PRN
  Administered 2016-02-24: 25 mL via PERINEURAL

## 2016-02-24 MED ORDER — NEOSTIGMINE METHYLSULFATE 10 MG/10ML IV SOLN
INTRAVENOUS | Status: DC | PRN
  Administered 2016-02-24: 3 mg via INTRAVENOUS

## 2016-02-24 MED ORDER — BUPIVACAINE HCL (PF) 0.25 % IJ SOLN
INTRAMUSCULAR | Status: AC
  Filled 2016-02-24: qty 30

## 2016-02-24 MED ORDER — DEXAMETHASONE SODIUM PHOSPHATE 10 MG/ML IJ SOLN
INTRAMUSCULAR | Status: AC
  Filled 2016-02-24: qty 1

## 2016-02-24 MED ORDER — FENTANYL CITRATE (PF) 100 MCG/2ML IJ SOLN
INTRAMUSCULAR | Status: AC
  Administered 2016-02-24: 100 ug via INTRAVENOUS
  Filled 2016-02-24: qty 2

## 2016-02-24 MED ORDER — SODIUM CHLORIDE 0.9 % IR SOLN
Status: DC | PRN
  Administered 2016-02-24 (×2): 3000 mL

## 2016-02-24 MED ORDER — METOCLOPRAMIDE HCL 5 MG/ML IJ SOLN
INTRAMUSCULAR | Status: AC
  Administered 2016-02-24: 10 mg via INTRAVENOUS
  Filled 2016-02-24: qty 2

## 2016-02-24 MED ORDER — PHENYLEPHRINE HCL 10 MG/ML IJ SOLN
10.0000 mg | INTRAVENOUS | Status: DC | PRN
  Administered 2016-02-24: 20 ug/min via INTRAVENOUS

## 2016-02-24 MED ORDER — LACTATED RINGERS IV SOLN
INTRAVENOUS | Status: DC | PRN
Start: 2016-02-24 — End: 2016-02-24
  Administered 2016-02-24 (×2): via INTRAVENOUS

## 2016-02-24 MED ORDER — LACTATED RINGERS IV SOLN: INTRAVENOUS | Status: DC

## 2016-02-24 MED ORDER — EPINEPHRINE HCL 1 MG/ML IJ SOLN
INTRAMUSCULAR | Status: DC | PRN
  Administered 2016-02-24: .1 mL

## 2016-02-24 MED ORDER — EPINEPHRINE HCL 1 MG/ML IJ SOLN
INTRAMUSCULAR | Status: AC
  Filled 2016-02-24: qty 1

## 2016-02-24 MED ORDER — ROCURONIUM BROMIDE 50 MG/5ML IV SOLN
INTRAVENOUS | Status: AC
  Filled 2016-02-24: qty 1

## 2016-02-24 MED ORDER — PHENYLEPHRINE HCL 10 MG/ML IJ SOLN
INTRAMUSCULAR | Status: DC | PRN
  Administered 2016-02-24 (×5): 80 ug via INTRAVENOUS

## 2016-02-24 MED ORDER — GLYCOPYRROLATE 0.2 MG/ML IJ SOLN
INTRAMUSCULAR | Status: DC | PRN
  Administered 2016-02-24: 0.4 mg via INTRAVENOUS
  Administered 2016-02-24: 0.2 mg via INTRAVENOUS

## 2016-02-24 MED ORDER — SODIUM CHLORIDE 0.9 % IJ SOLN
INTRAMUSCULAR | Status: DC | PRN
  Administered 2016-02-24: 30 mL

## 2016-02-24 MED ORDER — FENTANYL CITRATE (PF) 100 MCG/2ML IJ SOLN
100.0000 ug | Freq: Once | INTRAMUSCULAR | Status: AC
  Administered 2016-02-24: 100 ug via INTRAVENOUS

## 2016-02-24 MED ORDER — PROPOFOL 10 MG/ML IV BOLUS
INTRAVENOUS | Status: DC | PRN
  Administered 2016-02-24: 170 mg via INTRAVENOUS
  Administered 2016-02-24: 20 mg via INTRAVENOUS

## 2016-02-24 MED FILL — PROMETHAZINE 25 MG TABLET: 25 | 10 days supply | Qty: 30 | Fill #0

## 2016-02-24 MED FILL — ONDANSETRON HCL 4 MG TABLET: 4 | 10 days supply | Qty: 40 | Fill #0

## 2016-02-24 MED FILL — OXYCODONE/APAP 5/325MG: 5-325 | 10 days supply | Qty: 60 | Fill #0

## 2016-02-24 MED FILL — METHOCARBAMOL 500 MG TABLET: 500 | 10 days supply | Qty: 30 | Fill #0

## 2016-02-24 SURGICAL SUPPLY — 73 items
AID PSTN UNV HD RSTRNT DISP (MISCELLANEOUS) ×2
APL SKNCLS STERI-STRIP NONHPOA (GAUZE/BANDAGES/DRESSINGS) ×1
BENZOIN TINCTURE PRP APPL 2/3 (GAUZE/BANDAGES/DRESSINGS) ×2 IMPLANT
BLADE CUTTER GATOR 3.5 (BLADE) ×2 IMPLANT
BLADE GREAT WHITE 4.2 (BLADE) ×2 IMPLANT
BLADE SURG 11 STRL SS (BLADE) ×2 IMPLANT
BUR OVAL 6.0 (BURR) ×2 IMPLANT
CANNULA ACUFLEX KIT 5X76 (CANNULA) ×1 IMPLANT
COVER SURGICAL LIGHT HANDLE (MISCELLANEOUS) ×2 IMPLANT
DRAPE INCISE IOBAN 66X45 STRL (DRAPES) ×4 IMPLANT
DRAPE STERI 35X30 U-POUCH (DRAPES) ×2 IMPLANT
DRAPE U-SHAPE 47X51 STRL (DRAPES) ×4 IMPLANT
DRSG PAD ABDOMINAL 8X10 ST (GAUZE/BANDAGES/DRESSINGS) ×6 IMPLANT
DRSG TEGADERM 4X4.75 (GAUZE/BANDAGES/DRESSINGS) ×4 IMPLANT
DURAPREP 26ML APPLICATOR (WOUND CARE) ×2 IMPLANT
ELECT REM PT RETURN 9FT ADLT (ELECTROSURGICAL) ×2
ELECTRODE REM PT RTRN 9FT ADLT (ELECTROSURGICAL) ×1 IMPLANT
FILTER STRAW FLUID ASPIR (MISCELLANEOUS) ×2 IMPLANT
GAUZE SPONGE 4X4 12PLY STRL (GAUZE/BANDAGES/DRESSINGS) ×2 IMPLANT
GAUZE XEROFORM 1X8 LF (GAUZE/BANDAGES/DRESSINGS) ×2 IMPLANT
GLOVE BIO SURGEON ST LM GN SZ9 (GLOVE) ×2 IMPLANT
GLOVE BIOGEL PI IND STRL 6.5 (GLOVE) IMPLANT
GLOVE BIOGEL PI IND STRL 8 (GLOVE) ×1 IMPLANT
GLOVE BIOGEL PI INDICATOR 6.5 (GLOVE) ×1
GLOVE BIOGEL PI INDICATOR 8 (GLOVE) ×1
GLOVE SURG ORTHO 8.0 STRL STRW (GLOVE) ×2 IMPLANT
GOWN STRL REUS W/ TWL LRG LVL3 (GOWN DISPOSABLE) ×3 IMPLANT
GOWN STRL REUS W/TWL LRG LVL3 (GOWN DISPOSABLE) ×6
KIT BASIN OR (CUSTOM PROCEDURE TRAY) ×2 IMPLANT
KIT BIO-TENODESIS 3X8 DISP (MISCELLANEOUS) ×2
KIT INSRT BABSR STRL DISP BTN (MISCELLANEOUS) IMPLANT
KIT ROOM TURNOVER OR (KITS) ×2 IMPLANT
MANIFOLD NEPTUNE II (INSTRUMENTS) ×2 IMPLANT
NDL HYPO 25X1 1.5 SAFETY (NEEDLE) ×1 IMPLANT
NDL SCORPION MULTI FIRE (NEEDLE) ×1 IMPLANT
NDL SPNL 18GX3.5 QUINCKE PK (NEEDLE) ×1 IMPLANT
NDL SUT 6 .5 CRC .975X.05 MAYO (NEEDLE) ×1 IMPLANT
NEEDLE HYPO 25X1 1.5 SAFETY (NEEDLE) ×2 IMPLANT
NEEDLE MAYO TAPER (NEEDLE) ×2
NEEDLE SCORPION MULTI FIRE (NEEDLE) ×2 IMPLANT
NEEDLE SPNL 18GX3.5 QUINCKE PK (NEEDLE) ×2 IMPLANT
NS IRRIG 1000ML POUR BTL (IV SOLUTION) ×3 IMPLANT
PACK SHOULDER (CUSTOM PROCEDURE TRAY) ×2 IMPLANT
PAD ARMBOARD 7.5X6 YLW CONV (MISCELLANEOUS) ×4 IMPLANT
RESTRAINT HEAD UNIVERSAL NS (MISCELLANEOUS) ×3 IMPLANT
SCREW PEEK TENODESIS 7X23M (Screw) ×1 IMPLANT
SET ARTHROSCOPY TUBING (MISCELLANEOUS) ×2
SET ARTHROSCOPY TUBING LN (MISCELLANEOUS) ×1 IMPLANT
SLING ARM IMMOBILIZER LRG (SOFTGOODS) ×1 IMPLANT
SPONGE GAUZE 4X4 12PLY STER LF (GAUZE/BANDAGES/DRESSINGS) ×1 IMPLANT
SPONGE LAP 4X18 X RAY DECT (DISPOSABLE) ×4 IMPLANT
STRIP CLOSURE SKIN 1/2X4 (GAUZE/BANDAGES/DRESSINGS) ×2 IMPLANT
SUCTION FRAZIER HANDLE 10FR (MISCELLANEOUS) ×1
SUCTION TUBE FRAZIER 10FR DISP (MISCELLANEOUS) ×1 IMPLANT
SUT ETHILON 3 0 PS 1 (SUTURE) ×2 IMPLANT
SUT FIBERWIRE #2 38 T-5 BLUE (SUTURE)
SUT MNCRL AB 3-0 PS2 18 (SUTURE) ×1 IMPLANT
SUT PROLENE 3 0 PS 2 (SUTURE) ×2 IMPLANT
SUT VIC AB 0 CT1 27 (SUTURE) ×4
SUT VIC AB 0 CT1 27XBRD ANBCTR (SUTURE) ×2 IMPLANT
SUT VIC AB 1 CT1 27 (SUTURE) ×2
SUT VIC AB 1 CT1 27XBRD ANBCTR (SUTURE) ×1 IMPLANT
SUT VIC AB 2-0 CT1 27 (SUTURE) ×2
SUT VIC AB 2-0 CT1 TAPERPNT 27 (SUTURE) ×1 IMPLANT
SUT VICRYL 0 UR6 27IN ABS (SUTURE) ×2 IMPLANT
SUTURE FIBERWR #2 38 T-5 BLUE (SUTURE) IMPLANT
SYR 20CC LL (SYRINGE) ×4 IMPLANT
SYR 3ML LL SCALE MARK (SYRINGE) ×2 IMPLANT
SYR TB 1ML LUER SLIP (SYRINGE) ×2 IMPLANT
TOWEL OR 17X24 6PK STRL BLUE (TOWEL DISPOSABLE) ×2 IMPLANT
TOWEL OR 17X26 10 PK STRL BLUE (TOWEL DISPOSABLE) ×2 IMPLANT
WAND HAND CNTRL MULTIVAC 90 (MISCELLANEOUS) ×1 IMPLANT
WATER STERILE IRR 1000ML POUR (IV SOLUTION) ×2 IMPLANT

## 2016-02-24 NOTE — Progress Notes (Signed)
Report given to steve shaw rn as caregiver 

## 2016-02-24 NOTE — Transfer of Care (Signed)
Immediate Anesthesia Transfer of Care Note  Patient: Emma Mathews  Procedure(s) Performed: Procedure(s): RIGHT SHOULDER ARTHROSCOPY WITH DEBRIDEMENT, BICEPS TENODESIS (Right)  Patient Location: PACU  Anesthesia Type:General and Regional  Level of Consciousness: awake, alert  and oriented  Airway & Oxygen Therapy: Patient Spontanous Breathing  Post-op Assessment: Report given to RN, Post -op Vital signs reviewed and stable and Patient moving all extremities X 4  Post vital signs: Reviewed and stable  Last Vitals:  Filed Vitals:   02/24/16 1035 02/24/16 1036  BP:    Pulse: 71 73  Temp:    Resp: 16 14    Complications: No apparent anesthesia complications

## 2016-02-24 NOTE — Anesthesia Procedure Notes (Addendum)
Anesthesia Regional Block:  Supraclavicular block  Pre-Anesthetic Checklist: ,, timeout performed, Correct Patient, Correct Site, Correct Laterality, Correct Procedure, Correct Position, site marked, Risks and benefits discussed,  Surgical consent,  Pre-op evaluation,  At surgeon's request and post-op pain management  Laterality: Right and Upper  Prep: Maximum Sterile Barrier Precautions used and chloraprep       Needles:  Injection technique: Single-shot  Needle Type: Echogenic Stimulator Needle     Needle Length: 10cm 10 cm Needle Gauge: 21 and 21 G    Additional Needles:  Procedures: ultrasound guided (picture in chart) Supraclavicular block Narrative:  Injection made incrementally with aspirations every 5 mL.  Performed by: Personally   Additional Notes: Risks, benefits and alternative to block explained extensively.  Patient tolerated procedure well, without complications.   Procedure Name: Intubation Date/Time: 02/24/2016 11:44 AM Performed by: Garrison [place] T Pre-anesthesia Checklist: Patient identified, Emergency Drugs available, Suction available and Patient being monitored Patient Re-evaluated:Patient Re-evaluated prior to inductionOxygen Delivery Method: Circle system utilized Preoxygenation: Pre-oxygenation with 100% oxygen Intubation Type: IV induction Ventilation: Mask ventilation without difficulty Laryngoscope Size: Miller and 2 Grade View: Grade I Tube type: Oral Tube size: 7.0 mm Number of attempts: 1 Airway Equipment and Method: Stylet Placement Confirmation: ETT inserted through vocal cords under direct vision,  positive ETCO2 and breath sounds checked- equal and bilateral Secured at: 21 cm Tube secured with: Tape Dental Injury: Teeth and Oropharynx as per pre-operative assessment

## 2016-02-24 NOTE — H&P (Addendum)
Emma Mathews is an 40 y.o. female.   Chief Complaint: Right shoulder pain HPI: Emma Mathews is a 40 year old female with right shoulder pain. She initially had superior labral repair and open Bankart repair done in 2000. Eventually she required labral debridement for "large" superior labral tear as well as a joint subacromial decompression which did help for this is done 2007 by Dr. due to. She now reports continued but recent onset of pain in the right shoulder with reaching she describes mechanical symptoms. She has failed course of therapy and nonoperative treatment. MRI scan shows anterior-inferior posterior inferior gleno humeral ligaments to be intact the superior labrum is not well visualized.  Past Medical History  Diagnosis Date  . Seasonal allergies   . Complication of anesthesia     slow to wake up  . PONV (postoperative nausea and vomiting)   . Headache     migraine  . Pneumonia 2010  . History of blood transfusion     Past Surgical History  Procedure Laterality Date  . Cholecystectomy    . Laparoscopic appendectomy N/A 09/02/2013    Procedure: APPENDECTOMY LAPAROSCOPIC;  Surgeon: Harl Bowie, MD;  Location: [place];  Service: General;  Laterality: N/A;  . Appendectomy    . Eye surgery Bilateral     PRK  . Shoulder arthroscopy Left 2005    bone spurs   . Wisdom tooth extraction    . Labrum repair Right 2000    Open  . Shoulder surgery Right 2007    Revision and repair flap    History reviewed. No pertinent family history. Social History:  reports that she has never smoked. She does not have any smokeless tobacco history on file. She reports that she drinks alcohol. She reports that she does not use illicit drugs.  Allergies:  Allergies  Allergen Reactions  . Contrast Media [Iodinated Diagnostic Agents] Shortness Of Breath and Nausea And Vomiting    Shortness of breath  . Diphenhydramine Hcl Hives and Other (See Comments)    Pt states she can take Dye free  Benadryl  . Codeine Hives and Nausea And Vomiting    Medications Prior to Admission  Medication Sig Dispense Refill  . fexofenadine (ALLEGRA) 60 MG tablet Take 60 mg by mouth daily as needed for allergies or rhinitis.    Marland Kitchen ipratropium (ATROVENT) 0.03 % nasal spray Place 2 sprays into both nostrils 2 (two) times daily. (Patient taking differently: Place 2 sprays into both nostrils daily as needed (for congestion). ) 30 mL 0  . levonorgestrel (MIRENA) 20 MCG/24HR IUD 1 each by Intrauterine route once.    . Multiple Vitamin (MULTIVITAMIN) tablet Take 1 tablet by mouth daily.    . naproxen sodium (ANAPROX) 220 MG tablet Take 440 mg by mouth 2 (two) times daily as needed (for shoulder pain).    . mometasone (NASONEX) 50 MCG/ACT nasal spray Place 2 sprays into the nose daily as needed (for allergies).      Results for orders placed or performed during the hospital encounter of 02/24/16 (from the past 48 hour(s))  Hemoglobin     Status: None   Collection Time: 02/24/16  9:37 AM  Result Value Ref Range   Hemoglobin 12.9 12.0 - 15.0 g/dL   No results found.  Review of Systems  Constitutional: Negative.   HENT: Negative.   Eyes: Negative.   Respiratory: Negative.   Cardiovascular: Negative.   Gastrointestinal: Negative.   Genitourinary: Negative.   Musculoskeletal: Positive for joint pain.  Skin: Negative.   Neurological: Negative.   Endo/Heme/Allergies: Negative.   Psychiatric/Behavioral: Negative.     Blood pressure 113/59, pulse 73, temperature 98.3 F (36.8 C), temperature source Oral, resp. rate 14, height 5\' 6"  (1.676 m), weight 84.369 kg (186 lb), SpO2 97 %. Physical Exam  Constitutional: She appears well-developed.  HENT:  Head: Normocephalic.  Eyes: Pupils are equal, round, and reactive to light.  Neck: Normal range of motion.  Cardiovascular: Normal rate.   Respiratory: Effort normal.  Neurological: She is alert.  Skin: Skin is warm.  Psychiatric: She has a normal  mood and affect.   examination of the right shoulder demonstrates well-healed surgical incision anteriorly positive O'Brien's testing: Local impingement signs no before meals joint tenderness direct palpation some pain does radiate into the anterior aspect of the arm has pretty good shoulder stability on conscious exam with 1+ anterior 1+ posterior less than a centimeter sulcus sign. Told to take much in way of apprehension. With anterior or posterior directed force  Assessment/Plan Impression is right shoulder pain with definite mechanical symptoms in a patient who had a large SLAP tear debrided in 2008. She is not really having much in the way of shoulder subluxation but is having pain. Based on her exam history and prior surgery with suspect that this may be labral pathology. This was not very well visualized because of artifact from previous anchors placed. By the op note in 2007/thousand and 8 large SLAP tear was present. Plan at this time is arthroscopy debridement and evaluation of the glenohumeral ligaments which do appear to be intact on MRI scan with likely biceps tenodesis. Risks and benefits discussed with the patient couldn't limited to infection or vessel damage incomplete pain relief as well as potential for more surgery all questions answered  Meredith Pel, MD 02/24/2016, 11:04 AM

## 2016-02-24 NOTE — Anesthesia Preprocedure Evaluation (Addendum)
Anesthesia Evaluation  Patient identified by MRN, date of birth, ID band Patient awake    Reviewed: Allergy & Precautions, NPO status , Patient's Chart, lab work & pertinent test results  History of Anesthesia Complications (+) PONV and history of anesthetic complications  Airway Mallampati: II  TM Distance: >3 FB Neck ROM: Full    Dental no notable dental hx. (+) Dental Advisory Given   Pulmonary neg pulmonary ROS,    Pulmonary exam normal breath sounds clear to auscultation       Cardiovascular negative cardio ROS Normal cardiovascular exam Rhythm:Regular Rate:Normal     Neuro/Psych  Headaches, negative neurological ROS  negative psych ROS   GI/Hepatic negative GI ROS, Neg liver ROS,   Endo/Other  negative endocrine ROS  Renal/GU negative Renal ROS  negative genitourinary   Musculoskeletal negative musculoskeletal ROS (+)   Abdominal   Peds negative pediatric ROS (+)  Hematology negative hematology ROS (+)   Anesthesia Other Findings   Reproductive/Obstetrics negative OB ROS                            Anesthesia Physical Anesthesia Plan  ASA: I  Anesthesia Plan: General   Post-op Pain Management: GA combined w/ Regional for post-op pain   Induction: Intravenous  Airway Management Planned: Oral ETT  Additional Equipment:   Intra-op Plan:   Post-operative Plan: Extubation in OR  Informed Consent: I have reviewed the patients History and Physical, chart, labs and discussed the procedure including the risks, benefits and alternatives for the proposed anesthesia with the patient or authorized representative who has indicated his/her understanding and acceptance.   Dental advisory given  Plan Discussed with: CRNA, Anesthesiologist and Surgeon  Anesthesia Plan Comments:        Anesthesia Quick Evaluation

## 2016-02-24 NOTE — Brief Op Note (Signed)
02/24/2016  1:46 PM  PATIENT:  Emma Mathews  40 y.o. female  PRE-OPERATIVE DIAGNOSIS:  right shoulder SLAP tear  POST-OPERATIVE DIAGNOSIS:  right shoulder SLAP tear  PROCEDURE:  Procedure(s): RIGHT SHOULDER ARTHROSCOPY WITH DEBRIDEMENT, BICEPS TENODESIS  SURGEON:  Surgeon(s): Meredith Pel, MD  ASSISTANT: Ky Barban RNFA  ANESTHESIA:   regional and general  EBL: 35 ml    Total I/O In: 1000 [I.V.:1000] Out: 50 [Blood:50]  BLOOD ADMINISTERED: none  DRAINS: none   LOCAL MEDICATIONS USED:  none  SPECIMEN:  No Specimen  COUNTS:  YES  TOURNIQUET:  * No tourniquets in log *  DICTATION: .Other Dictation: Dictation Number [id number]  PLAN OF CARE: Discharge to home after PACU  PATIENT DISPOSITION:  PACU - hemodynamically stable

## 2016-02-25 ENCOUNTER — Encounter (HOSPITAL_COMMUNITY): Payer: Self-pay | Admitting: Orthopedic Surgery

## 2016-02-25 DIAGNOSIS — S43431A Superior glenoid labrum lesion of right shoulder, initial encounter: Secondary | ICD-10-CM | POA: Diagnosis not present

## 2016-02-25 NOTE — Op Note (Signed)
NAMEMONETTE, CHAMU NO.:  [id number]  MEDICAL RECORD NO.:  [id number]  LOCATION:  [facility]                         FACILITY:  [facility]  PHYSICIAN:  Anderson Malta, M.D.    DATE OF BIRTH:  07/08/1976  DATE OF PROCEDURE: DATE OF DISCHARGE:  02/24/2016                              OPERATIVE REPORT   PREOPERATIVE DIAGNOSIS:  Right shoulder superior labrum anterior and posterior tear.  POSTOPERATIVE DIAGNOSES:  Right shoulder superior labrum anterior and posterior tear.  Partial-thickness rotator cuff tear leading edge of the supraspinatus.  PROCEDURES:  Right shoulder arthroscopy with labral debridement, biceps tendon release, bursectomy, subacromial decompression, open biceps tenodesis.  SURGEON:  Anderson Malta, M.D.  ASSISTANT:  Alda Lea, RNFA  INDICATIONS:  Emma Mathews is a 40 year old female with right shoulder pain, presents for operative management after explanation of risks and benefits and failure to conservative treatment.  OPERATIVE FINDINGS: 1. Examination under anesthesia, the patient had good stability     anterior and posterior with 1+ or less instability, less than a cm     sulcus sign.  The patient had full range of motion, 180 forward     flexion, 100 of isolated glenohumeral abduction, she had external     rotation of 15 degrees with abduction to about 50. 2. Diagnostic arthroscopy:     a.     Type 2 SLAP tear, unstable.     b.     The patient had about a 1 x 1 cm full-thickness chondral      defect central anterior portion of the humeral head with no      corresponding glenoid defects.     c.     Intact anterior and posterior glenohumeral ligaments.     d.     Type 2 SLAP tear, unstable with sutures present.     e.     Partial thickness rotator cuff tearing less than 50% when      viewed from both the bursal and articular sides.  PROCEDURE IN DETAIL:  The patient was brought to the operating room where general anesthetic was induced.   Preoperative antibiotics were administered.  Time-out was called.  The patient was placed in the beach- chair position with the head in neutral position.  The right arm was prescrubbed with alcohol and Betadine, allowed to air dry, prepped with DuraPrep solution and draped in a sterile manner.  Most of the operative field was covered with [place] for the arthroscopy portion and the entire operative field was covered for the open biceps tenodesis portion. Posterior portal created about 2 cm inferior and medial to the posterolateral margin of the acromion.  Diagnostic arthroscopy was performed.  The patient had a SLAP tear as well as a scaly, desensitized chronic chondral defect anterior portion of the humeral head. Subscapularis intra-articular was intact.  The rotator cuff was intact. Leading edge of the supraspinatus had partial thickness tearing, but was less than 50% when both palpated later in the case as well as visualized from the articular surface and the bursal surface.  The glenohumeral ligaments were intact, anterior and posterior.  At this time, anterior portal created under direct visualization.  Biceps tendon  was released, superior labrum was debrided.  Humeral head chondral defect was visualized and it was on the clock face of the right humeral head, it was approximately between 8-10 o'clock.  No loose chondral flaps were present.  At this time, scope placed in the subacromial space, not much in the way of bursitis was present.  There was partial-thickness tearing supraspinatus, but no full-thickness component was noted.  Minimal debridement was then performed off the anterolateral margin of the acromion.  At this time, instruments were removed.  Portals were closed with 3-0 nylon, about a 3-cm incision made off the anterolateral margin of the acromion.  Deltoid was split by 4 cm, marked without a 1 Vicryl suture.  The transverse humeral ligament was then opened on the  medial side.  Biceps tendon was delivered and tenodesed within the bicipital groove using a 7 x 23-mm Bio-Tenodesis screw.  At this time, thorough irrigation was performed.  Rotator cuff was inspected from the bursal side and found to have a partial thickness tearing, but it was not full thickness, more attritional type tearing, nothing acute, decided that since there was less than 50% thickness by both visualization and palpation that it would be fine to not repair it.  At this time, thorough irrigation was again performed.  Deltoid split closed using 0 Vicryl suture followed by 2-0 Vicryl suture and 3-0 Monocryl. Impervious dressing was placed.  The patient tolerated the procedure well without immediate complication.  Shoulder immobilizer placed.     Anderson Malta, M.D.     GSD/MEDQ  D:  02/24/2016  T:  02/25/2016  Job:  [id number]

## 2016-02-25 NOTE — Anesthesia Postprocedure Evaluation (Signed)
Anesthesia Post Note  Patient: Emma Mathews  Procedure(s) Performed: Procedure(s) (LRB): RIGHT SHOULDER ARTHROSCOPY WITH DEBRIDEMENT, BICEPS TENODESIS (Right)  Patient location during evaluation: PACU Anesthesia Type: General and Regional Level of consciousness: awake and alert Pain management: pain level controlled Vital Signs Assessment: post-procedure vital signs reviewed and stable Respiratory status: spontaneous breathing, nonlabored ventilation, respiratory function stable and patient connected to nasal cannula oxygen Cardiovascular status: blood pressure returned to baseline and stable Postop Assessment: no signs of nausea or vomiting Anesthetic complications: no    Last Vitals:  Filed Vitals:   02/24/16 1505 02/24/16 1515  BP: 111/69 109/63  Pulse: 79   Temp: 36.6 C   Resp: 16     Last Pain:  Filed Vitals:   02/24/16 1522  PainSc: Asleep                 Montez Hageman

## 2016-02-26 ENCOUNTER — Encounter (HOSPITAL_COMMUNITY): Payer: Self-pay | Admitting: Orthopedic Surgery

## 2016-03-04 DIAGNOSIS — M25611 Stiffness of right shoulder, not elsewhere classified: Secondary | ICD-10-CM | POA: Diagnosis not present

## 2016-03-04 DIAGNOSIS — M25511 Pain in right shoulder: Secondary | ICD-10-CM | POA: Diagnosis not present

## 2016-03-04 DIAGNOSIS — M6281 Muscle weakness (generalized): Secondary | ICD-10-CM | POA: Diagnosis not present

## 2016-03-08 DIAGNOSIS — M25611 Stiffness of right shoulder, not elsewhere classified: Secondary | ICD-10-CM | POA: Diagnosis not present

## 2016-03-08 DIAGNOSIS — M6281 Muscle weakness (generalized): Secondary | ICD-10-CM | POA: Diagnosis not present

## 2016-03-08 DIAGNOSIS — M25511 Pain in right shoulder: Secondary | ICD-10-CM | POA: Diagnosis not present

## 2016-03-09 ENCOUNTER — Encounter (HOSPITAL_COMMUNITY): Payer: Self-pay | Admitting: Orthopedic Surgery

## 2016-03-11 DIAGNOSIS — M25511 Pain in right shoulder: Secondary | ICD-10-CM | POA: Diagnosis not present

## 2016-03-11 DIAGNOSIS — M25611 Stiffness of right shoulder, not elsewhere classified: Secondary | ICD-10-CM | POA: Diagnosis not present

## 2016-03-11 DIAGNOSIS — M6281 Muscle weakness (generalized): Secondary | ICD-10-CM | POA: Diagnosis not present

## 2016-03-16 DIAGNOSIS — M25511 Pain in right shoulder: Secondary | ICD-10-CM | POA: Diagnosis not present

## 2016-03-16 DIAGNOSIS — M25611 Stiffness of right shoulder, not elsewhere classified: Secondary | ICD-10-CM | POA: Diagnosis not present

## 2016-03-16 DIAGNOSIS — M6281 Muscle weakness (generalized): Secondary | ICD-10-CM | POA: Diagnosis not present

## 2016-03-19 DIAGNOSIS — M6281 Muscle weakness (generalized): Secondary | ICD-10-CM | POA: Diagnosis not present

## 2016-03-19 DIAGNOSIS — M25511 Pain in right shoulder: Secondary | ICD-10-CM | POA: Diagnosis not present

## 2016-03-19 DIAGNOSIS — M25611 Stiffness of right shoulder, not elsewhere classified: Secondary | ICD-10-CM | POA: Diagnosis not present

## 2016-03-23 DIAGNOSIS — M25511 Pain in right shoulder: Secondary | ICD-10-CM | POA: Diagnosis not present

## 2016-03-23 DIAGNOSIS — M6281 Muscle weakness (generalized): Secondary | ICD-10-CM | POA: Diagnosis not present

## 2016-03-23 DIAGNOSIS — M25611 Stiffness of right shoulder, not elsewhere classified: Secondary | ICD-10-CM | POA: Diagnosis not present

## 2016-03-26 DIAGNOSIS — M25611 Stiffness of right shoulder, not elsewhere classified: Secondary | ICD-10-CM | POA: Diagnosis not present

## 2016-03-26 DIAGNOSIS — M25511 Pain in right shoulder: Secondary | ICD-10-CM | POA: Diagnosis not present

## 2016-03-26 DIAGNOSIS — M6281 Muscle weakness (generalized): Secondary | ICD-10-CM | POA: Diagnosis not present

## 2016-03-30 DIAGNOSIS — M6281 Muscle weakness (generalized): Secondary | ICD-10-CM | POA: Diagnosis not present

## 2016-03-30 DIAGNOSIS — M25511 Pain in right shoulder: Secondary | ICD-10-CM | POA: Diagnosis not present

## 2016-03-30 DIAGNOSIS — M25611 Stiffness of right shoulder, not elsewhere classified: Secondary | ICD-10-CM | POA: Diagnosis not present

## 2016-04-02 DIAGNOSIS — M6281 Muscle weakness (generalized): Secondary | ICD-10-CM | POA: Diagnosis not present

## 2016-04-02 DIAGNOSIS — M25511 Pain in right shoulder: Secondary | ICD-10-CM | POA: Diagnosis not present

## 2016-04-02 DIAGNOSIS — M25611 Stiffness of right shoulder, not elsewhere classified: Secondary | ICD-10-CM | POA: Diagnosis not present

## 2016-04-06 DIAGNOSIS — M25611 Stiffness of right shoulder, not elsewhere classified: Secondary | ICD-10-CM | POA: Diagnosis not present

## 2016-04-06 DIAGNOSIS — M25511 Pain in right shoulder: Secondary | ICD-10-CM | POA: Diagnosis not present

## 2016-04-06 DIAGNOSIS — M6281 Muscle weakness (generalized): Secondary | ICD-10-CM | POA: Diagnosis not present

## 2016-04-09 DIAGNOSIS — M25611 Stiffness of right shoulder, not elsewhere classified: Secondary | ICD-10-CM | POA: Diagnosis not present

## 2016-04-09 DIAGNOSIS — M6281 Muscle weakness (generalized): Secondary | ICD-10-CM | POA: Diagnosis not present

## 2016-04-09 DIAGNOSIS — M25511 Pain in right shoulder: Secondary | ICD-10-CM | POA: Diagnosis not present

## 2016-04-13 DIAGNOSIS — M6281 Muscle weakness (generalized): Secondary | ICD-10-CM | POA: Diagnosis not present

## 2016-04-13 DIAGNOSIS — M25511 Pain in right shoulder: Secondary | ICD-10-CM | POA: Diagnosis not present

## 2016-04-13 DIAGNOSIS — M25611 Stiffness of right shoulder, not elsewhere classified: Secondary | ICD-10-CM | POA: Diagnosis not present

## 2016-04-16 DIAGNOSIS — M25511 Pain in right shoulder: Secondary | ICD-10-CM | POA: Diagnosis not present

## 2016-04-16 DIAGNOSIS — M6281 Muscle weakness (generalized): Secondary | ICD-10-CM | POA: Diagnosis not present

## 2016-04-16 DIAGNOSIS — M25611 Stiffness of right shoulder, not elsewhere classified: Secondary | ICD-10-CM | POA: Diagnosis not present

## 2016-04-20 DIAGNOSIS — M25511 Pain in right shoulder: Secondary | ICD-10-CM | POA: Diagnosis not present

## 2016-04-20 DIAGNOSIS — M25611 Stiffness of right shoulder, not elsewhere classified: Secondary | ICD-10-CM | POA: Diagnosis not present

## 2016-04-20 DIAGNOSIS — M6281 Muscle weakness (generalized): Secondary | ICD-10-CM | POA: Diagnosis not present

## 2016-04-27 DIAGNOSIS — M6281 Muscle weakness (generalized): Secondary | ICD-10-CM | POA: Diagnosis not present

## 2016-04-27 DIAGNOSIS — M25511 Pain in right shoulder: Secondary | ICD-10-CM | POA: Diagnosis not present

## 2016-04-27 DIAGNOSIS — M25611 Stiffness of right shoulder, not elsewhere classified: Secondary | ICD-10-CM | POA: Diagnosis not present

## 2016-05-04 DIAGNOSIS — M25611 Stiffness of right shoulder, not elsewhere classified: Secondary | ICD-10-CM | POA: Diagnosis not present

## 2016-05-04 DIAGNOSIS — M6281 Muscle weakness (generalized): Secondary | ICD-10-CM | POA: Diagnosis not present

## 2016-05-04 DIAGNOSIS — M25511 Pain in right shoulder: Secondary | ICD-10-CM | POA: Diagnosis not present

## 2016-05-11 DIAGNOSIS — M6281 Muscle weakness (generalized): Secondary | ICD-10-CM | POA: Diagnosis not present

## 2016-05-11 DIAGNOSIS — M25611 Stiffness of right shoulder, not elsewhere classified: Secondary | ICD-10-CM | POA: Diagnosis not present

## 2016-05-11 DIAGNOSIS — M25511 Pain in right shoulder: Secondary | ICD-10-CM | POA: Diagnosis not present

## 2016-09-29 DIAGNOSIS — L900 Lichen sclerosus et atrophicus: Secondary | ICD-10-CM | POA: Diagnosis not present

## 2016-09-29 DIAGNOSIS — D485 Neoplasm of uncertain behavior of skin: Secondary | ICD-10-CM | POA: Diagnosis not present

## 2016-10-29 DIAGNOSIS — D485 Neoplasm of uncertain behavior of skin: Secondary | ICD-10-CM | POA: Diagnosis not present

## 2016-10-29 DIAGNOSIS — D225 Melanocytic nevi of trunk: Secondary | ICD-10-CM | POA: Diagnosis not present

## 2016-10-29 DIAGNOSIS — L910 Hypertrophic scar: Secondary | ICD-10-CM | POA: Diagnosis not present

## 2016-10-29 DIAGNOSIS — L814 Other melanin hyperpigmentation: Secondary | ICD-10-CM | POA: Diagnosis not present

## 2016-10-29 DIAGNOSIS — Z85820 Personal history of malignant melanoma of skin: Secondary | ICD-10-CM | POA: Diagnosis not present

## 2016-10-29 DIAGNOSIS — L720 Epidermal cyst: Secondary | ICD-10-CM | POA: Diagnosis not present

## 2016-11-01 DIAGNOSIS — D225 Melanocytic nevi of trunk: Secondary | ICD-10-CM | POA: Diagnosis not present

## 2016-11-01 DIAGNOSIS — D2272 Melanocytic nevi of left lower limb, including hip: Secondary | ICD-10-CM | POA: Diagnosis not present

## 2016-11-12 ENCOUNTER — Telehealth: Payer: 59 | Admitting: Family

## 2016-11-12 DIAGNOSIS — J209 Acute bronchitis, unspecified: Secondary | ICD-10-CM

## 2016-11-12 MED ORDER — PREDNISONE 10 MG (21) PO TBPK: 10.0000 mg | ORAL_TABLET | Freq: Every day | ORAL | 0 refills | Status: DC

## 2016-11-12 MED ORDER — DOXYCYCLINE HYCLATE 100 MG PO TABS: 100.0000 mg | ORAL_TABLET | Freq: Two times a day (BID) | ORAL | 0 refills | Status: DC

## 2016-11-12 MED FILL — DOXYCYCLINE HYCLATE 100 MG: 100 | 7 days supply | Qty: 14 | Fill #0

## 2016-11-12 MED FILL — predniSONE 10 MG TABS: 10 | 6 days supply | Qty: 21 | Fill #0

## 2016-11-12 NOTE — Progress Notes (Signed)
We are sorry that you are not feeling well.  Here is how we plan to help!  Based on what you have shared with me it looks like you have upper respiratory tract inflammation that has resulted in a significant cough.  Inflammation and infection in the upper respiratory tract is commonly called bronchitis and has four common causes:  Allergies, Viral Infections, Acid Reflux and Bacterial Infections.  Allergies, viruses and acid reflux are treated by controlling symptoms or eliminating the cause. An example might be a cough caused by taking certain blood pressure medications. You stop the cough by changing the medication. Another example might be a cough caused by acid reflux. Controlling the reflux helps control the cough.  Based on your presentation I believe you most likely have A cough due to bacteria.  When patients have a fever and a productive cough with a change in color or increased sputum production, we are concerned about bacterial bronchitis.  If left untreated it can progress to pneumonia.  If your symptoms do not improve with your treatment plan it is important that you contact your provider.   I hve prescribed Doxycycline 100 mg twice a day for 7 days     In addition you may use A non-prescription cough medication called Mucinex DM: take 2 tablets every 12 hours.  Sterapred 10 mg dosepak  USE OF BRONCHODILATOR ("RESCUE") INHALERS: There is a risk from using your bronchodilator too frequently.  The risk is that over-reliance on a medication which only relaxes the muscles surrounding the breathing tubes can reduce the effectiveness of medications prescribed to reduce swelling and congestion of the tubes themselves.  Although you feel brief relief from the bronchodilator inhaler, your asthma may actually be worsening with the tubes becoming more swollen and filled with mucus.  This can delay other crucial treatments, such as oral steroid medications. If you need to use a bronchodilator inhaler  daily, several times per day, you should discuss this with your provider.  There are probably better treatments that could be used to keep your asthma under control.     HOME CARE . Only take medications as instructed by your medical team. . Complete the entire course of an antibiotic. . Drink plenty of fluids and get plenty of rest. . Avoid close contacts especially the very young and the elderly . Cover your mouth if you cough or cough into your sleeve. . Always remember to wash your hands . A steam or ultrasonic humidifier can help congestion.   GET HELP RIGHT AWAY IF: . You develop worsening fever. . You become short of breath . You cough up blood. . Your symptoms persist after you have completed your treatment plan MAKE SURE YOU   Understand these instructions.  Will watch your condition.  Will get help right away if you are not doing well or get worse.  Your e-visit answers were reviewed by a board certified advanced clinical practitioner to complete your personal care plan.  Depending on the condition, your plan could have included both over the counter or prescription medications. If there is a problem please reply  once you have received a response from your provider. Your safety is important to [place].  If you have drug allergies check your prescription carefully.    You can use MyChart to ask questions about today's visit, request a non-urgent call back, or ask for a work or school excuse for 24 hours related to this e-Visit. If it has been greater than  24 hours you will need to follow up with your provider, or enter a new e-Visit to address those concerns. You will get an e-mail in the next two days asking about your experience.  I hope that your e-visit has been valuable and will speed your recovery. Thank you for using e-visits.  

## 2016-11-16 ENCOUNTER — Ambulatory Visit (INDEPENDENT_AMBULATORY_CARE_PROVIDER_SITE_OTHER): Payer: 59

## 2016-11-16 ENCOUNTER — Ambulatory Visit (INDEPENDENT_AMBULATORY_CARE_PROVIDER_SITE_OTHER): Payer: 59 | Admitting: Family Medicine

## 2016-11-16 VITALS — BP 134/79 | HR 92 | Temp 98.5°F | Resp 18 | Ht 66.0 in | Wt 203.0 lb

## 2016-11-16 DIAGNOSIS — R05 Cough: Secondary | ICD-10-CM

## 2016-11-16 DIAGNOSIS — R059 Cough, unspecified: Secondary | ICD-10-CM

## 2016-11-16 DIAGNOSIS — R058 Other specified cough: Secondary | ICD-10-CM

## 2016-11-16 LAB — POCT CBC
GRANULOCYTE PERCENT: 59.9 % (ref 37–80)
HCT, POC: 38.3 % (ref 37.7–47.9)
Hemoglobin: 13 g/dL (ref 12.2–16.2)
LYMPH, POC: 2.5 (ref 0.6–3.4)
MCH, POC: 27.4 pg (ref 27–31.2)
MCHC: 34.1 g/dL (ref 31.8–35.4)
MCV: 80.3 fL (ref 80–97)
MID (cbc): 1 — AB (ref 0–0.9)
MPV: 7.4 fL (ref 0–99.8)
PLATELET COUNT, POC: 259 10*3/uL (ref 142–424)
POC Granulocyte: 5.2 (ref 2–6.9)
POC LYMPH %: 28.6 % (ref 10–50)
POC MID %: 11.5 %M (ref 0–12)
RBC: 4.77 M/uL (ref 4.04–5.48)
RDW, POC: 13.5 %
WBC: 8.7 10*3/uL (ref 4.6–10.2)

## 2016-11-16 MED ORDER — ALBUTEROL SULFATE HFA 108 (90 BASE) MCG/ACT IN AERS: 2.0000 | INHALATION_SPRAY | RESPIRATORY_TRACT | 1 refills | Status: DC | PRN

## 2016-11-16 MED ORDER — IPRATROPIUM BROMIDE 0.02 % IN SOLN
0.5000 mg | Freq: Once | RESPIRATORY_TRACT | Status: AC
  Administered 2016-11-16: 0.5 mg via RESPIRATORY_TRACT

## 2016-11-16 MED ORDER — ALBUTEROL SULFATE (2.5 MG/3ML) 0.083% IN NEBU
2.5000 mg | INHALATION_SOLUTION | Freq: Once | RESPIRATORY_TRACT | Status: AC
  Administered 2016-11-16: 2.5 mg via RESPIRATORY_TRACT

## 2016-11-16 MED ORDER — BENZONATATE 100 MG PO CAPS: 100.0000 mg | ORAL_CAPSULE | Freq: Three times a day (TID) | ORAL | 0 refills | Status: DC | PRN

## 2016-11-16 MED ORDER — PREDNISONE 10 MG PO TABS: ORAL_TABLET | ORAL | 0 refills | Status: DC

## 2016-11-16 MED ORDER — HYDROCOD POLST-CPM POLST ER 10-8 MG/5ML PO SUER: 5.0000 mL | Freq: Two times a day (BID) | ORAL | 0 refills | Status: DC | PRN

## 2016-11-16 MED FILL — BENZONATATE 100 MG CAPSULE: 100 | 5 days supply | Qty: 30 | Fill #0

## 2016-11-16 MED FILL — predniSONE 10 MG TABS: 10 | 9 days supply | Qty: 12 | Fill #0

## 2016-11-16 MED FILL — VENTOLIN HFA 90 MCG INHALER: 108 (90 BAS | 25 days supply | Qty: 18 | Fill #0

## 2016-11-16 MED FILL — HYDROCODONE-CHLORPHENIRAM S: 10-8 | 6 days supply | Qty: 60 | Fill #0

## 2016-11-16 NOTE — Progress Notes (Signed)
Patient ID: Emma Mathews, female    DOB: 12-24-1975  Age: 41 y.o. MRN: [id number]  Chief Complaint  Patient presents with  . Cough    has had cough for a long time; was treated via Evisit on last friday with meds; has gotten worse  . Back Pain    patient states coughing has her back hurting and it radiates to chest area    Subjective:   For the past month the patient has had a respiratory tract infection. Started as a cold, then settled into a cough which has continued to persist. She has taken some OTC cough medications and mucolytics. She coughs up very little, but when she does it is a gray-brown substance. She does not smoke. Her son and been sick but he got well. This has gotten to where she coughs so constantly and hard that she has pain around her back and sides with a burning discomfort when she tries to cough. She got a phone medical consultation 4 days ago, was placed on a Sterapred Dosepak and she is down to 3 pills of prednisone today. This has not seemed to help substantially. She was also given doxycycline which she is taking twice a day. She is not febrile.  She has a history of coughs in recent years. Otherwise she is fairly healthy. She denies pregnancy.  Current allergies, medications, problem list, past/family and social histories reviewed.  Objective:  BP 134/79   Pulse 92   Temp 98.5 F (36.9 C) (Oral)   Resp 18   Ht 5\' 6"  (1.676 m)   Wt 203 lb (92.1 kg)   SpO2 98%   PF 250 L/min   BMI 32.77 kg/m   Constant cough. TMs normal. Throat clear. Neck supple without nodes. Chest is clear to auscultation. Heart regular.  Assessment & Plan:   Assessment: 1. Post-viral cough syndrome   2. Cough       Plan: We'll check a CBC and chest x-ray for this postviral cough problem.  Chest x-ray is normal with radiology reading normal also.  CBC is normal  Will do hand-held nebulizer treatment and see how her lungs sound. Peak flow measured at 250 on 3 separate  readings, though she is unable to exhale forcefully without triggering the cough.  Patient did improve to 270 after the nebulizer treatment. She is not coughing as much already. Hopefully we can treat the inflammation in her lungs and get her improving. Return if needed. Orders Placed This Encounter  Procedures  . DG Chest 2 View    Standing Status:   Future    Number of Occurrences:   1    Standing Expiration Date:   11/16/2017    Order Specific Question:   Reason for Exam (SYMPTOM  OR DIAGNOSIS REQUIRED)    Answer:   cough 1 month; post viral cough syndrome    Order Specific Question:   Is the patient pregnant?    Answer:   No    Order Specific Question:   Preferred imaging location?    Answer:   External  . POCT CBC    Meds ordered this encounter  Medications  . albuterol (PROVENTIL) (2.5 MG/3ML) 0.083% nebulizer solution 2.5 mg  . ipratropium (ATROVENT) nebulizer solution 0.5 mg  . chlorpheniramine-HYDROcodone (TUSSIONEX PENNKINETIC ER) 10-8 MG/5ML SUER    Sig: Take 5 mLs by mouth every 12 (twelve) hours as needed for cough.    Dispense:  60 mL    Refill:  0  .  benzonatate (TESSALON) 100 MG capsule    Sig: Take 1-2 capsules (100-200 mg total) by mouth 3 (three) times daily as needed.    Dispense:  30 capsule    Refill:  0  . predniSONE (DELTASONE) 10 MG tablet    Sig: Take 3 daily for 3 days, then 2 daily for 3 days, then 1 daily for 3 days    Dispense:  12 tablet    Refill:  0  . albuterol (PROVENTIL HFA;VENTOLIN HFA) 108 (90 Base) MCG/ACT inhaler    Sig: Inhale 2 puffs into the lungs every 4 (four) hours as needed for wheezing or shortness of breath (cough, shortness of breath or wheezing.).    Dispense:  1 Inhaler    Refill:  1     Patient Instructions  Drink plenty of fluids and get enough rest  Continue your doxycycline until it is gone  Take prednisone as you have been, but from this point take 3 pills daily for 3 days, then 2 daily for 3 days, then 1 daily for 3  days of the 10 mg pills  Take benzonatate (Tessalon) 1 or 2 pills 3 times daily as needed for cough  Take the Tussionex cough syrup 1 teaspoon (5 mL's) every 12 hours. Primarily for use at nighttime as it will cause daytime drowsiness.  Return if worse.    Return if symptoms worsen or fail to improve.   Arles Rumbold, MD 11/16/2016

## 2016-11-16 NOTE — Patient Instructions (Addendum)
Drink plenty of fluids and get enough rest  Continue your doxycycline until it is gone  Take prednisone as you have been, but from this point take 3 pills daily for 3 days, then 2 daily for 3 days, then 1 daily for 3 days of the 10 mg pills  Take benzonatate (Tessalon) 1 or 2 pills 3 times daily as needed for cough  Take the Tussionex cough syrup 1 teaspoon (5 mL's) every 12 hours. Primarily for use at nighttime as it will cause daytime drowsiness.  Return if worse.

## 2016-11-22 ENCOUNTER — Encounter: Payer: Self-pay | Admitting: Family Medicine

## 2016-11-22 DIAGNOSIS — R058 Other specified cough: Secondary | ICD-10-CM

## 2016-11-22 DIAGNOSIS — R059 Cough, unspecified: Secondary | ICD-10-CM

## 2016-11-22 DIAGNOSIS — R05 Cough: Secondary | ICD-10-CM

## 2016-11-22 MED ORDER — HYDROCOD POLST-CPM POLST ER 10-8 MG/5ML PO SUER: 5.0000 mL | Freq: Two times a day (BID) | ORAL | 0 refills | Status: DC | PRN

## 2016-11-22 NOTE — Telephone Encounter (Signed)
Pt requesting refill of Tussionex? Forward to [facility] for Dr. Linna Darner

## 2016-11-22 NOTE — Telephone Encounter (Signed)
Patient notified via My Chart.  Meds ordered this encounter  Medications  . chlorpheniramine-HYDROcodone (TUSSIONEX PENNKINETIC ER) 10-8 MG/5ML SUER    Sig: Take 5 mLs by mouth every 12 (twelve) hours as needed for cough.    Dispense:  60 mL    Refill:  0    Order Specific Question:   Supervising Provider    Answer:   Brigitte Pulse, EVA N [[id number]]

## 2016-11-24 MED FILL — HYDROCODONE-CHLORPHENIRAM S: 10-8 | 6 days supply | Qty: 60 | Fill #0

## 2016-12-10 DIAGNOSIS — L910 Hypertrophic scar: Secondary | ICD-10-CM | POA: Diagnosis not present

## 2016-12-11 ENCOUNTER — Encounter (HOSPITAL_COMMUNITY): Payer: Self-pay

## 2016-12-11 ENCOUNTER — Emergency Department (HOSPITAL_COMMUNITY): Payer: 59

## 2016-12-11 ENCOUNTER — Emergency Department (HOSPITAL_COMMUNITY)
Admission: EM | Admit: 2016-12-11 | Discharge: 2016-12-11 | Disposition: A | Discharge status: Home / Self Care | Payer: 59 | Attending: Emergency Medicine | Admitting: Emergency Medicine

## 2016-12-11 DIAGNOSIS — N83292 Other ovarian cyst, left side: Secondary | ICD-10-CM | POA: Insufficient documentation

## 2016-12-11 DIAGNOSIS — N83512 Torsion of left ovary and ovarian pedicle: Secondary | ICD-10-CM | POA: Insufficient documentation

## 2016-12-11 DIAGNOSIS — R102 Pelvic and perineal pain: Secondary | ICD-10-CM | POA: Diagnosis not present

## 2016-12-11 DIAGNOSIS — N83209 Unspecified ovarian cyst, unspecified side: Secondary | ICD-10-CM

## 2016-12-11 DIAGNOSIS — Z79899 Other long term (current) drug therapy: Secondary | ICD-10-CM | POA: Diagnosis not present

## 2016-12-11 DIAGNOSIS — N83519 Torsion of ovary and ovarian pedicle, unspecified side: Secondary | ICD-10-CM

## 2016-12-11 DIAGNOSIS — N83202 Unspecified ovarian cyst, left side: Secondary | ICD-10-CM | POA: Diagnosis not present

## 2016-12-11 LAB — URINALYSIS, ROUTINE W REFLEX MICROSCOPIC
Bilirubin Urine: NEGATIVE
Glucose, UA: NEGATIVE mg/dL
Hgb urine dipstick: NEGATIVE
Ketones, ur: NEGATIVE mg/dL
LEUKOCYTES UA: NEGATIVE
NITRITE: NEGATIVE
Protein, ur: NEGATIVE mg/dL
SPECIFIC GRAVITY, URINE: 1.021 (ref 1.005–1.030)
pH: 6 (ref 5.0–8.0)

## 2016-12-11 LAB — CBC
HCT: 39.6 % (ref 36.0–46.0)
HEMOGLOBIN: 13.1 g/dL (ref 12.0–15.0)
MCH: 26.4 pg (ref 26.0–34.0)
MCHC: 33.1 g/dL (ref 30.0–36.0)
MCV: 79.7 fL (ref 78.0–100.0)
Platelets: 249 10*3/uL (ref 150–400)
RBC: 4.97 MIL/uL (ref 3.87–5.11)
RDW: 14.3 % (ref 11.5–15.5)
WBC: 5.7 10*3/uL (ref 4.0–10.5)

## 2016-12-11 LAB — COMPREHENSIVE METABOLIC PANEL
ALBUMIN: 4 g/dL (ref 3.5–5.0)
ALT: 15 U/L (ref 14–54)
ANION GAP: 9 (ref 5–15)
AST: 18 U/L (ref 15–41)
Alkaline Phosphatase: 76 U/L (ref 38–126)
BILIRUBIN TOTAL: 0.5 mg/dL (ref 0.3–1.2)
BUN: 12 mg/dL (ref 6–20)
CHLORIDE: 106 mmol/L (ref 101–111)
CO2: 24 mmol/L (ref 22–32)
Calcium: 9.7 mg/dL (ref 8.9–10.3)
Creatinine, Ser: 0.84 mg/dL (ref 0.44–1.00)
GFR calc Af Amer: >60 mL/min (ref >60)
GLUCOSE: 82 mg/dL (ref 65–99)
POTASSIUM: 4.3 mmol/L (ref 3.5–5.1)
Sodium: 139 mmol/L (ref 135–145)
TOTAL PROTEIN: 7.4 g/dL (ref 6.5–8.1)

## 2016-12-11 LAB — LIPASE, BLOOD: LIPASE: 20 U/L (ref 11–51)

## 2016-12-11 LAB — I-STAT BETA HCG BLOOD, ED (MC, WL, AP ONLY): I-stat hCG, quantitative: <5 m[IU]/mL (ref <5)

## 2016-12-11 LAB — WET PREP, GENITAL
Sperm: NONE SEEN
TRICH WET PREP: NONE SEEN
YEAST WET PREP: NONE SEEN

## 2016-12-11 MED ORDER — ONDANSETRON HCL 4 MG/2ML IJ SOLN: 4.0000 mg | Freq: Once | INTRAMUSCULAR | Status: DC

## 2016-12-11 MED ORDER — OXYCODONE-ACETAMINOPHEN 5-325 MG PO TABS: 1.0000 | ORAL_TABLET | ORAL | 0 refills | Status: DC | PRN

## 2016-12-11 MED ORDER — OXYCODONE-ACETAMINOPHEN 5-325 MG PO TABS
2.0000 | ORAL_TABLET | Freq: Once | ORAL | Status: AC
  Administered 2016-12-11: 2 via ORAL
  Filled 2016-12-11: qty 2

## 2016-12-11 MED ORDER — KETOROLAC TROMETHAMINE 60 MG/2ML IM SOLN
60.0000 mg | Freq: Once | INTRAMUSCULAR | Status: AC
  Administered 2016-12-11: 60 mg via INTRAMUSCULAR
  Filled 2016-12-11: qty 2

## 2016-12-11 MED ORDER — ONDANSETRON 4 MG PO TBDP
4.0000 mg | ORAL_TABLET | Freq: Once | ORAL | Status: AC
  Administered 2016-12-11: 4 mg via ORAL
  Filled 2016-12-11: qty 1

## 2016-12-11 NOTE — ED Triage Notes (Signed)
Patient here with lower abdominal pain that started yesterday while at work. States that the pain is constant and thinks related to ovarian cyst. No dysuria, denies discharge

## 2016-12-11 NOTE — Discharge Instructions (Signed)
Get help right away if: You have abdominal pain that is severe or gets worse. You cannot eat or drink without vomiting. You suddenly develop a fever. Your menstrual period is much heavier than usual 

## 2016-12-13 ENCOUNTER — Encounter: Payer: Self-pay | Admitting: Physician Assistant

## 2016-12-13 NOTE — ED Provider Notes (Signed)
[facility] DEPT Provider Note   CSN: [id number] Arrival date & time: 12/11/16  1136     History   Chief Complaint Chief Complaint  Patient presents with  . Abdominal Pain    HPI Emma Mathews is a 41 y.o. female who presents with cc of pelvic pain. She had onset of pain 2 days ago and states it feels similar but not completely the same as previous ovarian cysts she has had in the past. She developed in her pelvic region that has been progressively worsening over the past 2 days. It is worse with movement, laughing or coughing. She feels better if she lies very still. She has not taken anything for pain today, but took motrin and a tramadol from a previous shoulder surgery with minimal relief. She denies bleeding , urinary or vaginal sxs. She denies vomiting , constipation . Flank pain, or nausea. She does not have regular menstrual periods because she has a mirena IUD. She is s/p lap chole/appendectomy.  HPI  Past Medical History:  Diagnosis Date  . Complication of anesthesia    slow to wake up  . Headache    migraine  . History of blood transfusion   . Pneumonia 2010  . PONV (postoperative nausea and vomiting)   . Seasonal allergies     Patient Active Problem List   Diagnosis Date Noted  . Appendicitis, acute 10/02/2013  . ADD 10/28/2009  . ANEMIA-UNSPECIFIED 04/28/2009  . STRESS FRACTURE, FOOT 04/08/2009  . ADULT SITUATIONAL REACTION 02/25/2009  . ALLERGIC RHINITIS 02/25/2009    Past Surgical History:  Procedure Laterality Date  . APPENDECTOMY    . CHOLECYSTECTOMY    . EYE SURGERY Bilateral    PRK  . Labrum Repair Right 2000   Open  . LAPAROSCOPIC APPENDECTOMY N/A 09/02/2013   Procedure: APPENDECTOMY LAPAROSCOPIC;  Surgeon: Harl Bowie, MD;  Location: [facility];  Service: General;  Laterality: N/A;  . SHOULDER ARTHROSCOPY Left 2005   bone spurs   . SHOULDER ARTHROSCOPY WITH SUBACROMIAL DECOMPRESSION, ROTATOR CUFF REPAIR AND BICEP TENDON REPAIR Right  02/24/2016   Procedure: RIGHT SHOULDER ARTHROSCOPY WITH DEBRIDEMENT, BICEPS TENODESIS;  Surgeon: Meredith Pel, MD;  Location: [facility];  Service: Orthopedics;  Laterality: Right;  . SHOULDER SURGERY Right 2007   Revision and repair flap  . WISDOM TOOTH EXTRACTION      OB History    No data available       Home Medications    Prior to Admission medications   Medication Sig Start Date End Date Taking? Authorizing Provider  albuterol (PROVENTIL HFA;VENTOLIN HFA) 108 (90 Base) MCG/ACT inhaler Inhale 2 puffs into the lungs every 4 (four) hours as needed for wheezing or shortness of breath (cough, shortness of breath or wheezing.). 11/16/16  Yes Posey Boyer, MD  fexofenadine (ALLEGRA) 60 MG tablet Take 60 mg by mouth daily as needed for allergies or rhinitis.   Yes Historical Provider, MD  ibuprofen (ADVIL,MOTRIN) 200 MG tablet Take 600 mg by mouth every 6 (six) hours as needed for headache or moderate pain.   Yes Historical Provider, MD  levonorgestrel (MIRENA) 20 MCG/24HR IUD 1 each by Intrauterine route once.   Yes Historical Provider, MD  Multiple Vitamin (MULTIVITAMIN) tablet Take 1 tablet by mouth daily.   Yes Historical Provider, MD  OVER THE COUNTER MEDICATION Apply 1 application topically daily. Scar Cream OTC   Yes Historical Provider, MD  benzonatate (TESSALON) 100 MG capsule Take 1-2 capsules (100-200 mg total) by  mouth 3 (three) times daily as needed. Patient not taking: Reported on 12/11/2016 11/16/16   Posey Boyer, MD  chlorpheniramine-HYDROcodone [facility]) 10-8 MG/5ML SUER Take 5 mLs by mouth every 12 (twelve) hours as needed for cough. Patient not taking: Reported on 12/11/2016 11/22/16   Harrison Mons, PA-C  doxycycline (VIBRA-TABS) 100 MG tablet Take 1 tablet (100 mg total) by mouth 2 (two) times daily. Patient not taking: Reported on 12/11/2016 11/12/16   Kennyth Arnold, FNP  ipratropium (ATROVENT) 0.03 % nasal spray Place 2 sprays into both nostrils 2  (two) times daily. Patient not taking: Reported on 12/11/2016 01/20/15   Dorian Heckle English, PA  naproxen sodium (ANAPROX) 220 MG tablet Take 440 mg by mouth 2 (two) times daily as needed (for shoulder pain).    Historical Provider, MD  oxyCODONE-acetaminophen (PERCOCET) 5-325 MG tablet Take 1-2 tablets by mouth every 4 (four) hours as needed. 12/11/16   Margarita Mail, PA-C  predniSONE (DELTASONE) 10 MG tablet Take 3 daily for 3 days, then 2 daily for 3 days, then 1 daily for 3 days Patient not taking: Reported on 12/11/2016 11/16/16   Posey Boyer, MD    Family History No family history on file.  Social History Social History  Substance Use Topics  . Smoking status: Never Smoker  . Smokeless tobacco: Never Used  . Alcohol use Yes     Comment: ocassional - social 1- 2 drinks     Allergies   Contrast media [iodinated diagnostic agents]; Diphenhydramine hcl; and Codeine   Review of Systems Review of Systems  Ten systems reviewed and are negative for acute change, except as noted in the HPI.   Physical Exam Updated Vital Signs BP 106/64 (BP Location: Right Arm)   Pulse 72   Temp 98.3 F (36.8 C) (Oral)   Resp 18   Ht 5\' 7"  (1.702 m)   Wt 88.9 kg   SpO2 99%   BMI 30.70 kg/m   Physical Exam Physical Exam  Nursing note and vitals reviewed. Constitutional: She is oriented to person, place, and time. She appears well-developed and well-nourished. No distress.  HENT:  Head: Normocephalic and atraumatic.  Eyes: Conjunctivae normal and EOM are normal. Pupils are equal, round, and reactive to light. No scleral icterus.  Neck: Normal range of motion.  Cardiovascular: Normal rate, regular rhythm and normal heart sounds.  Exam reveals no gallop and no friction rub.   No murmur heard. Pulmonary/Chest: Effort normal and breath sounds normal. No respiratory distress.  Abdominal: Soft. Bowel sounds are normal. She exhibits no distension and no mass. Suprapubic  tenderness. Neurological: She is alert and oriented to person, place, and time.  Skin: Skin is warm and dry. She is not diaphoretic.  GU: Pelvic exam: VULVA: normal appearing vulva with no masses, tenderness or lesions, VAGINA: normal appearing vagina with normal color and discharge, no lesions, CERVIX: normal appearing cervix without discharge or lesions,. Diffuse tenderness on bimanual exam. No adnexal fullness or masses.   ED Treatments / Results  Labs (all labs ordered are listed, but only abnormal results are displayed) Labs Reviewed  WET PREP, GENITAL - Abnormal; Notable for the following:       Result Value   Clue Cells Wet Prep HPF POC PRESENT (*)    WBC, Wet Prep HPF POC MANY (*)    All other components within normal limits  LIPASE, BLOOD  COMPREHENSIVE METABOLIC PANEL  CBC  URINALYSIS, ROUTINE W REFLEX MICROSCOPIC  I-STAT  BETA HCG BLOOD, ED ([facility], [facility], [facility] ONLY)  GC/CHLAMYDIA PROBE AMP ([place]) NOT AT [facility]    EKG  EKG Interpretation None       Radiology No results found.  Procedures Procedures (including critical care time)  Medications Ordered in ED Medications  ketorolac (TORADOL) injection 60 mg (60 mg Intramuscular Given 12/11/16 1425)  oxyCODONE-acetaminophen (PERCOCET/ROXICET) 5-325 MG per tablet 2 tablet (2 tablets Oral Given 12/11/16 1741)  ondansetron (ZOFRAN-ODT) disintegrating tablet 4 mg (4 mg Oral Given 12/11/16 1741)     Initial Impression / Assessment and Plan / ED Course  I have reviewed the triage vital signs and the nursing notes.  Pertinent labs & imaging results that were available during my care of the patient were reviewed by me and considered in my medical decision making (see chart for details).      Patient labs normal.  Wet prep shows asymptomatic BV- no treatment is indicated. US shows hemorrhagic ovarian cyst which I believe is the cause. Will treat the patient with pain medications. I Have asked that she f/u with her OB/GYN.  Discussed return precautions.  Final Clinical Impressions(s) / ED Diagnoses   Final diagnoses:  Ovarian torsion  Hemorrhagic cyst of ovary    New Prescriptions Discharge Medication List as of 12/11/2016  5:31 PM       Margarita Mail, PA-C 12/13/16 [place], DO 12/14/16 [phone]

## 2016-12-14 LAB — GC/CHLAMYDIA PROBE AMP (~~LOC~~) NOT AT ARMC
Chlamydia: NEGATIVE
Neisseria Gonorrhea: NEGATIVE

## 2017-01-25 ENCOUNTER — Ambulatory Visit (INDEPENDENT_AMBULATORY_CARE_PROVIDER_SITE_OTHER): Payer: 59 | Admitting: Physician Assistant

## 2017-01-25 VITALS — BP 112/70 | HR 77 | Temp 97.5°F | Resp 16 | Ht 67.0 in | Wt 206.0 lb

## 2017-01-25 DIAGNOSIS — J069 Acute upper respiratory infection, unspecified: Secondary | ICD-10-CM

## 2017-01-25 DIAGNOSIS — B9789 Other viral agents as the cause of diseases classified elsewhere: Secondary | ICD-10-CM

## 2017-01-25 MED ORDER — CETIRIZINE-PSEUDOEPHEDRINE ER 5-120 MG PO TB12: 1.0000 | ORAL_TABLET | Freq: Every day | ORAL | 0 refills | Status: DC

## 2017-01-25 MED ORDER — OXYMETAZOLINE HCL 0.05 % NA SOLN: 1.0000 | Freq: Two times a day (BID) | NASAL | 0 refills | Status: DC

## 2017-01-25 MED ORDER — NAPROXEN 500 MG PO TABS: 500.0000 mg | ORAL_TABLET | Freq: Two times a day (BID) | ORAL | 0 refills | Status: DC

## 2017-01-25 NOTE — Progress Notes (Signed)
01/25/2017 5:28 PM   DOB: 06/10/1976 / MRN: [id number]  SUBJECTIVE:  Emma Mathews is a 41 y.o. female presenting for nasal congestion and sore throat that started 5 days ago. Took allergy medication.  Two days later developed "razor cup" and felt it was so painful that she did not want to swallow.  Had a mild fever at 100.1 to 100.2 on Sunday.  Since then has developed dark colored nasal discharge and left sided ear pain. Has tried OTC pain relievers and allergy medication and feels that she is getting overall worse, however the throat and the ear are getting better.     She is allergic to contrast media [iodinated diagnostic agents]; diphenhydramine hcl; and codeine.   She  has a past medical history of Complication of anesthesia; Headache; History of blood transfusion; Pneumonia (2010); PONV (postoperative nausea and vomiting); and Seasonal allergies.    She  reports that she has never smoked. She has never used smokeless tobacco. She reports that she drinks alcohol. She reports that she does not use drugs. She  reports that she currently engages in sexual activity. The patient  has a past surgical history that includes Cholecystectomy; laparoscopic appendectomy (N/A, 09/02/2013); Appendectomy; Eye surgery (Bilateral); Shoulder arthroscopy (Left, 2005); Wisdom tooth extraction; Labrum Repair (Right, 2000); Shoulder surgery (Right, 2007); and Shoulder arthroscopy with subacromial decompression, rotator cuff repair and bicep tendon repair (Right, 02/24/2016).  Her family history is not on file.  Review of Systems  Respiratory: Negative for cough.   Musculoskeletal: Negative for myalgias.  Neurological: Positive for headaches (sinus). Negative for dizziness.    The problem list and medications were reviewed and updated by myself where necessary and exist elsewhere in the encounter.   OBJECTIVE:  BP 112/70   Pulse 77   Temp 97.5 F (36.4 C) (Oral)   Resp 16   Ht 5\' 7"  (1.702 m)   Wt  206 lb (93.4 kg)   SpO2 99%   BMI 32.26 kg/m   Physical Exam  Constitutional: She is oriented to person, place, and time.  HENT:  Right Ear: Tympanic membrane normal.  Left Ear: Tympanic membrane normal.  Nose: Mucosal edema (bluish hue) present.  Mouth/Throat: Uvula is midline, oropharynx is clear and moist and mucous membranes are normal.  Cardiovascular: Normal rate and regular rhythm.   Pulmonary/Chest: Effort normal and breath sounds normal.  Musculoskeletal: Normal range of motion.  Neurological: She is alert and oriented to person, place, and time.    No results found for this or any previous visit (from the past 72 hour(s)).  No results found.  ASSESSMENT AND PLAN:  Emma Mathews was seen today for sore throat, nasal congestion and ear pain.  Diagnoses and all orders for this visit:  Viral URI: Advised more time and a few medication changes.  -     cetirizine-pseudoephedrine (ZYRTEC-D) 5-120 MG tablet; Take 1 tablet by mouth daily after breakfast. -     naproxen (NAPROSYN) 500 MG tablet; Take 1 tablet (500 mg total) by mouth 2 (two) times daily with a meal. -     oxymetazoline (AFRIN) 0.05 % nasal spray; Place 1 spray into both nostrils 2 (two) times daily. Do not use for more than three night consecutively.    The patient is advised to call or return to clinic if she does not see an improvement in symptoms, or to seek the care of the closest emergency department if she worsens with the above plan.   Philis Fendt, MHS,  PA-C [facility] 01/25/2017 5:28 PM

## 2017-01-25 NOTE — Patient Instructions (Signed)
     IF you received an x-ray today, you will receive an invoice from [facility]. Please contact [facility] at [phone] with questions or concerns regarding your invoice.   IF you received labwork today, you will receive an invoice from [facility]. Please contact [facility] at [phone] with questions or concerns regarding your invoice.   Our billing staff will not be able to assist you with questions regarding bills from these companies.  You will be contacted with the lab results as soon as they are available. The fastest way to get your results is to activate your My Chart account. Instructions are located on the last page of this paperwork. If you have not heard from us regarding the results in 2 weeks, please contact this office.     

## 2017-03-08 DIAGNOSIS — Z01419 Encounter for gynecological examination (general) (routine) without abnormal findings: Secondary | ICD-10-CM | POA: Diagnosis not present

## 2017-03-08 DIAGNOSIS — Z6833 Body mass index (BMI) 33.0-33.9, adult: Secondary | ICD-10-CM | POA: Diagnosis not present

## 2017-03-08 DIAGNOSIS — Z124 Encounter for screening for malignant neoplasm of cervix: Secondary | ICD-10-CM | POA: Diagnosis not present

## 2017-05-10 ENCOUNTER — Ambulatory Visit (INDEPENDENT_AMBULATORY_CARE_PROVIDER_SITE_OTHER): Payer: 59 | Admitting: Urgent Care

## 2017-05-10 ENCOUNTER — Encounter: Payer: Self-pay | Admitting: Urgent Care

## 2017-05-10 VITALS — BP 119/82 | HR 65 | Temp 98.2°F | Resp 16 | Ht 66.0 in | Wt 205.4 lb

## 2017-05-10 DIAGNOSIS — R6889 Other general symptoms and signs: Secondary | ICD-10-CM

## 2017-05-10 DIAGNOSIS — R52 Pain, unspecified: Secondary | ICD-10-CM | POA: Diagnosis not present

## 2017-05-10 DIAGNOSIS — R5381 Other malaise: Secondary | ICD-10-CM

## 2017-05-10 DIAGNOSIS — Z20828 Contact with and (suspected) exposure to other viral communicable diseases: Secondary | ICD-10-CM | POA: Diagnosis not present

## 2017-05-10 DIAGNOSIS — L910 Hypertrophic scar: Secondary | ICD-10-CM | POA: Diagnosis not present

## 2017-05-10 MED ORDER — OSELTAMIVIR PHOSPHATE 75 MG PO CAPS: 75.0000 mg | ORAL_CAPSULE | Freq: Two times a day (BID) | ORAL | 0 refills | Status: DC

## 2017-05-10 MED FILL — OSELTAMIVIR PHOSPHATE 75 MG: 75 | 5 days supply | Qty: 10 | Fill #0

## 2017-05-10 NOTE — Progress Notes (Signed)
  MRN: [id number] DOB: 28-Jul-1976  Subjective:   Emma Mathews is a 41 y.o. female presenting for chief complaint of Cough (yesterday) and Sore Throat  Reports 1 day history of nasal congestion, body aches, sore and scratchy throat, fever (highest was 101F), chills, ear popping, dry hacking cough. Has been using Advil, APAP. Denies sinus pain, ear pain, chest pain, shob, n/v, abdominal pain, rashes. Denies smoking cigarettes. Has had 2 sick contacts with flu like symptoms.  Emma Mathews has a current medication list which includes the following prescription(s): ipratropium, levonorgestrel, and naproxen. Also is allergic to contrast media [iodinated diagnostic agents]; diphenhydramine hcl; and codeine. Emma Mathews  has a past medical history of Complication of anesthesia; Headache; History of blood transfusion; Pneumonia (2010); PONV (postoperative nausea and vomiting); and Seasonal allergies. Also  has a past surgical history that includes Cholecystectomy; laparoscopic appendectomy (N/A, 09/02/2013); Appendectomy; Eye surgery (Bilateral); Shoulder arthroscopy (Left, 2005); Wisdom tooth extraction; Labrum Repair (Right, 2000); Shoulder surgery (Right, 2007); and Shoulder arthroscopy with subacromial decompression, rotator cuff repair and bicep tendon repair (Right, 02/24/2016).  Objective:   Vitals: BP 119/82 (BP Location: Right Arm, Patient Position: Sitting, Cuff Size: Large)   Pulse 65   Temp 98.2 F (36.8 C) (Oral)   Resp 16   Ht 5\' 6"  (1.676 m)   Wt 205 lb 6.4 oz (93.2 kg)   SpO2 99%   BMI 33.15 kg/m   Physical Exam  Constitutional: She is oriented to person, place, and time. She appears well-developed and well-nourished.  HENT:  TM's intact bilaterally, no effusions or erythema. Nasal turbinates pink and moist, nasal passages patent. No sinus tenderness. Oropharynx with mild erythema but no exudates, mucous membranes moist.  Eyes: Right eye exhibits no discharge. Left eye exhibits no  discharge.  Neck: Normal range of motion. Neck supple.  Cardiovascular: Normal rate, regular rhythm and intact distal pulses.  Exam reveals no gallop and no friction rub.   No murmur heard. Pulmonary/Chest: No respiratory distress. She has no wheezes. She has no rales.  Lymphadenopathy:    She has no cervical adenopathy.  Neurological: She is alert and oriented to person, place, and time.  Skin: Skin is warm and dry.  Psychiatric: She has a normal mood and affect.   Assessment and Plan :   1. Flu-like symptoms 2. Exposure to influenza 3. Body aches 4. Malaise - Will cover for influenza with Tamiflu given positive flu test for husband earlier today. Return-to-clinic precautions discussed, patient verbalized understanding.   Emma Eagles, PA-C [facility] [phone] 05/10/2017  2:47 PM

## 2017-05-10 NOTE — Patient Instructions (Addendum)
You may take 500mg  Tylenol with ibuprofen 400-600mg  every 6 hours for pain and inflammation. For sore throat try using a honey-based tea. Use 3 teaspoons of honey with juice squeezed from half lemon. Place shaved pieces of ginger into 1/2-1 cup of water and warm over stove top. Then mix the ingredients and repeat every 4 hours as needed.    Influenza, Adult Influenza, more commonly known as "the flu," is a viral infection that primarily affects the respiratory tract. The respiratory tract includes organs that help you breathe, such as the lungs, nose, and throat. The flu causes many common cold symptoms, as well as a high fever and body aches. The flu spreads easily from person to person (is contagious). Getting a flu shot (influenza vaccination) every year is the best way to prevent influenza. What are the causes? Influenza is caused by a virus. You can catch the virus by:  Breathing in droplets from an infected person's cough or sneeze.  Touching something that was recently contaminated with the virus and then touching your mouth, nose, or eyes.  What increases the risk? The following factors may make you more likely to get the flu:  Not cleaning your hands frequently with soap and water or alcohol-based hand sanitizer.  Having close contact with many people during cold and flu season.  Touching your mouth, eyes, or nose without washing or sanitizing your hands first.  Not drinking enough fluids or not eating a healthy diet.  Not getting enough sleep or exercise.  Being under a high amount of stress.  Not getting a yearly (annual) flu shot.  You may be at a higher risk of complications from the flu, such as a severe lung infection (pneumonia), if you:  Are over the age of 8.  Are pregnant.  Have a weakened disease-fighting system (immune system). You may have a weakened immune system if you: ? Have HIV or AIDS. ? Are undergoing chemotherapy. ? Aretaking medicines that  reduce the activity of (suppress) the immune system.  Have a long-term (chronic) illness, such as heart disease, kidney disease, diabetes, or lung disease.  Have a liver disorder.  Are obese.  Have anemia.  What are the signs or symptoms? Symptoms of this condition typically last 4-10 days and may include:  Fever.  Chills.  Headache, body aches, or muscle aches.  Sore throat.  Cough.  Runny or congested nose.  Chest discomfort and cough.  Poor appetite.  Weakness or tiredness (fatigue).  Dizziness.  Nausea or vomiting.  How is this diagnosed? This condition may be diagnosed based on your medical history and a physical exam. Your health care provider may do a nose or throat swab test to confirm the diagnosis. How is this treated? If influenza is detected early, you can be treated with antiviral medicine that can reduce the length of your illness and the severity of your symptoms. This medicine may be given by mouth (orally) or through an IV tube that is inserted in one of your veins. The goal of treatment is to relieve symptoms by taking care of yourself at home. This may include taking over-the-counter medicines, drinking plenty of fluids, and adding humidity to the air in your home. In some cases, influenza goes away on its own. Severe influenza or complications from influenza may be treated in a hospital. Follow these instructions at home:  Take over-the-counter and prescription medicines only as told by your health care provider.  Use a cool mist humidifier to add humidity  to the air in your home. This can make breathing easier.  Rest as needed.  Drink enough fluid to keep your urine clear or pale yellow.  Cover your mouth and nose when you cough or sneeze.  Wash your hands with soap and water often, especially after you cough or sneeze. If soap and water are not available, use hand sanitizer.  Stay home from work or school as told by your health care  provider. Unless you are visiting your health care provider, try to avoid leaving home until your fever has been gone for 24 hours without the use of medicine.  Keep all follow-up visits as told by your health care provider. This is important. How is this prevented?  Getting an annual flu shot is the best way to avoid getting the flu. You may get the flu shot in late summer, fall, or winter. Ask your health care provider when you should get your flu shot.  Wash your hands often or use hand sanitizer often.  Avoid contact with people who are sick during cold and flu season.  Eat a healthy diet, drink plenty of fluids, get enough sleep, and exercise regularly. Contact a health care provider if:  You develop new symptoms.  You have: ? Chest pain. ? Diarrhea. ? A fever.  Your cough gets worse.  You produce more mucus.  You feel nauseous or you vomit. Get help right away if:  You develop shortness of breath or difficulty breathing.  Your skin or nails turn a bluish color.  You have severe pain or stiffness in your neck.  You develop a sudden headache or sudden pain in your face or ear.  You cannot stop vomiting. This information is not intended to replace advice given to you by your health care provider. Make sure you discuss any questions you have with your health care provider. Document Released: 10/29/2000 Document Revised: 04/08/2016 Document Reviewed: 08/26/2015 Elsevier Interactive Patient Education  2017 [employer].     IF you received an x-ray today, you will receive an invoice from [facility]. Please contact [facility] at [phone] with questions or concerns regarding your invoice.   IF you received labwork today, you will receive an invoice from [facility]. Please contact [facility] at [phone] with questions or concerns regarding your invoice.   Our billing staff will not be able to assist you with questions regarding bills from these  companies.  You will be contacted with the lab results as soon as they are available. The fastest way to get your results is to activate your My Chart account. Instructions are located on the last page of this paperwork. If you have not heard from [place] regarding the results in 2 weeks, please contact this office.    We recommend that you schedule a mammogram for breast cancer screening. Typically, you do not need a referral to do this. Please contact a local imaging center to schedule your mammogram.  [facility] - [phone]  *ask for the Radiology Department [facility] - [phone] or [phone]  [facility] - [phone] [place] [phone] [facility] - [phone]  *ask for the [facility] - [phone]  *ask for the Radiology Department [facility] - [phone]  *ask for the [facility] - [phone]

## 2017-05-20 ENCOUNTER — Ambulatory Visit: Payer: 59 | Admitting: Family Medicine

## 2017-06-20 DIAGNOSIS — Z1231 Encounter for screening mammogram for malignant neoplasm of breast: Secondary | ICD-10-CM | POA: Diagnosis not present

## 2017-06-28 DIAGNOSIS — R928 Other abnormal and inconclusive findings on diagnostic imaging of breast: Secondary | ICD-10-CM

## 2017-06-28 HISTORY — DX: Other abnormal and inconclusive findings on diagnostic imaging of breast: R92.8

## 2017-07-12 ENCOUNTER — Other Ambulatory Visit: Payer: Self-pay | Admitting: Obstetrics and Gynecology

## 2017-07-12 DIAGNOSIS — R928 Other abnormal and inconclusive findings on diagnostic imaging of breast: Secondary | ICD-10-CM

## 2017-07-12 DIAGNOSIS — N63 Unspecified lump in unspecified breast: Secondary | ICD-10-CM | POA: Diagnosis not present

## 2017-07-15 ENCOUNTER — Ambulatory Visit
Admission: RE | Admit: 2017-07-15 | Discharge: 2017-07-15 | Disposition: A | Discharge status: Home / Self Care | Payer: 59 | Source: Ambulatory Visit | Attending: Obstetrics and Gynecology | Admitting: Obstetrics and Gynecology

## 2017-07-15 DIAGNOSIS — R928 Other abnormal and inconclusive findings on diagnostic imaging of breast: Secondary | ICD-10-CM

## 2017-07-15 DIAGNOSIS — N63 Unspecified lump in unspecified breast: Secondary | ICD-10-CM

## 2017-07-15 DIAGNOSIS — D242 Benign neoplasm of left breast: Secondary | ICD-10-CM | POA: Diagnosis not present

## 2017-07-15 DIAGNOSIS — N6321 Unspecified lump in the left breast, upper outer quadrant: Secondary | ICD-10-CM | POA: Diagnosis not present

## 2017-08-03 ENCOUNTER — Ambulatory Visit (INDEPENDENT_AMBULATORY_CARE_PROVIDER_SITE_OTHER): Payer: 59

## 2017-08-03 ENCOUNTER — Ambulatory Visit (INDEPENDENT_AMBULATORY_CARE_PROVIDER_SITE_OTHER): Payer: 59 | Admitting: Orthopedic Surgery

## 2017-08-03 ENCOUNTER — Encounter (INDEPENDENT_AMBULATORY_CARE_PROVIDER_SITE_OTHER): Payer: Self-pay | Admitting: Orthopedic Surgery

## 2017-08-03 DIAGNOSIS — M25531 Pain in right wrist: Secondary | ICD-10-CM

## 2017-08-04 NOTE — Progress Notes (Signed)
Office Visit Note   Patient: Emma Mathews           Date of Birth: 06-15-1976           MRN: [id number] Visit Date: 08/03/2017 Requested by: Robyn Haber, MD [address] [place], [place] [address] PCP: Robyn Haber, MD  Subjective: Chief Complaint  Patient presents with  . Right Wrist - Injury    HPI: Emma Mathews is a 41 year old patient with right wrist pain.  She slipped and fell 07/31/2014 on a wet bridge.  She fell on her outstretched wrist.  She is right-hand dominant.  Radial and ulnar deviation causes her pain which she localizes to the radial aspect of the wrist.  She is doing well with right shoulder surgery.  Start for her to grip and hold things.  She's been taking Advil.              ROS: All systems reviewed are negative as they relate to the chief complaint within the history of present illness.  Patient denies  fevers or chills.   Assessment & Plan: Visit Diagnoses:  1. Pain in right wrist     Plan: Impression is right wrist pain with normal radiographs.  Fairly minimal swelling is present.  She does have radial sided tenderness.  Motor sensory function to the wrist is intact.  Like to put her in a splint and continue with anti-inflammatories.  See her back in 2 weeks repeat clinical examination possible repeat radiograph and/or MRI scanning if there is concern for scaphoid fracture.  Follow-Up Instructions: Return in about 2 weeks (around 08/17/2017).   Orders:  Orders Placed This Encounter  Procedures  . XR Wrist Complete Right   No orders of the defined types were placed in this encounter.     Procedures: No procedures performed   Clinical Data: No additional findings.  Objective: Vital Signs: There were no vitals taken for this visit.  Physical Exam:   Constitutional: Patient appears well-developed HEENT:  Head: Normocephalic Eyes:EOM are normal Neck: Normal range of motion Cardiovascular: Normal rate Pulmonary/chest: Effort  normal Neurologic: Patient is alert Skin: Skin is warm Psychiatric: Patient has normal mood and affect    Ortho Exam: Orthopedic exam demonstrates good grip strength with tenderness over the radial styloid into some degree in the snuffbox.  Flexion-extension is mildly painful radial ulnar deviation is more painful.  No swelling or bruising is present on the volar or dorsal aspect of the hand.  Elbow range of motion is full.  Specialty Comments:  No specialty comments available.  Imaging: Xr Wrist Complete Right  Result Date: 08/04/2017 AP lateral scaphoid view right wrist reviewed.  No definite fracture is noted.  No radiocarpal or midcarpal arthritis is present.  No widening scapholunate or lunate triquetral interval    PMFS History: Patient Active Problem List   Diagnosis Date Noted  . Appendicitis, acute 10/02/2013  . ADD 10/28/2009  . ANEMIA-UNSPECIFIED 04/28/2009  . STRESS FRACTURE, FOOT 04/08/2009  . ADULT SITUATIONAL REACTION 02/25/2009  . ALLERGIC RHINITIS 02/25/2009   Past Medical History:  Diagnosis Date  . Complication of anesthesia    slow to wake up  . Headache    migraine  . History of blood transfusion   . Pneumonia 2010  . PONV (postoperative nausea and vomiting)   . Seasonal allergies     No family history on file.  Past Surgical History:  Procedure Laterality Date  . APPENDECTOMY    . CHOLECYSTECTOMY    .  EYE SURGERY Bilateral    PRK  . Labrum Repair Right 2000   Open  . LAPAROSCOPIC APPENDECTOMY N/A 09/02/2013   Procedure: APPENDECTOMY LAPAROSCOPIC;  Surgeon: Harl Bowie, MD;  Location: [facility];  Service: General;  Laterality: N/A;  . SHOULDER ARTHROSCOPY Left 2005   bone spurs   . SHOULDER ARTHROSCOPY WITH SUBACROMIAL DECOMPRESSION, ROTATOR CUFF REPAIR AND BICEP TENDON REPAIR Right 02/24/2016   Procedure: RIGHT SHOULDER ARTHROSCOPY WITH DEBRIDEMENT, BICEPS TENODESIS;  Surgeon: Meredith Pel, MD;  Location: [facility];  Service:  Orthopedics;  Laterality: Right;  . SHOULDER SURGERY Right 2007   Revision and repair flap  . WISDOM TOOTH EXTRACTION     Social History   Occupational History  . Not on file.   Social History Main Topics  . Smoking status: Never Smoker  . Smokeless tobacco: Never Used  . Alcohol use Yes     Comment: ocassional - social 1- 2 drinks  . Drug use: No  . Sexual activity: Yes

## 2017-08-17 ENCOUNTER — Ambulatory Visit (INDEPENDENT_AMBULATORY_CARE_PROVIDER_SITE_OTHER): Payer: 59 | Admitting: Orthopedic Surgery

## 2017-08-17 ENCOUNTER — Encounter (INDEPENDENT_AMBULATORY_CARE_PROVIDER_SITE_OTHER): Payer: Self-pay | Admitting: Orthopedic Surgery

## 2017-08-17 DIAGNOSIS — M25531 Pain in right wrist: Secondary | ICD-10-CM | POA: Diagnosis not present

## 2017-08-17 NOTE — Addendum Note (Signed)
Addended byLaurann Montana on: 08/17/2017 02:17 PM   Modules accepted: Orders

## 2017-08-17 NOTE — Progress Notes (Signed)
Office Visit Note   Patient: Emma Mathews           Date of Birth: February 14, 1976           MRN: [id number] Visit Date: 08/17/2017 Requested by: Robyn Haber, MD [address] [place], [place] [address] PCP: Robyn Haber, MD  Subjective: Chief Complaint  Patient presents with  . Right Wrist - Follow-up    HPI: Emma Mathews is a 41 year old female with right wrist pain.  She had an injury 07/31/2017.  She's doing some some better but she is still symptomatic.  Radial sided pain is resolved but she now reports continued and ulnar sided wrist pain in this dominant hand.  She also reports numbness and tingling in the median distribution.  She is waking up with her hand numb at times.  This is primarily a dorsiflexion injury where she fell in her hand was dorsiflexed and hit a tree.  She is taking Advil for her symptoms along with topical anti-inflammatory              ROS: All systems reviewed are negative as they relate to the chief complaint within the history of present illness.  Patient denies  fevers or chills.   Assessment & Plan: Visit Diagnoses: No diagnosis found.  Plan: Impression is right wrist pain.  Suspicion for TFCC injury is high based on mechanical symptoms and ulnar sided pain.  Plan MRI arthrogram of the right wrist.  She does have some issues with the arthrogram dye.  We will need a Benadryl regimen to be done preoperatively like she did with her shoulder scan.  I'll see her back after that study  Follow-Up Instructions: Return for after MRI.   Orders:  No orders of the defined types were placed in this encounter.  No orders of the defined types were placed in this encounter.     Procedures: No procedures performed   Clinical Data: No additional findings.  Objective: Vital Signs: There were no vitals taken for this visit.  Physical Exam:   Constitutional: Patient appears well-developed HEENT:  Head: Normocephalic Eyes:EOM are normal Neck:  Normal range of motion Cardiovascular: Normal rate Pulmonary/chest: Effort normal Neurologic: Patient is alert Skin: Skin is warm Psychiatric: Patient has normal mood and affect    Ortho Exam: Orthopedic exam demonstrates tenderness over the TFCC with no subluxation of the TCU with pronation supination.  Grip strength is intact.  Ulnar sided tenderness has resolved but there has been some swelling affecting the digits which can be confirmed upon examination of both hands.  She has painful dorsiflexion and plantarflexion with about 10-15 less range of motion on the right than the left.  Ulnar and radial deviation also painful with some pop and clicking noted on examination today.  Specialty Comments:  No specialty comments available.  Imaging: No results found.   PMFS History: Patient Active Problem List   Diagnosis Date Noted  . Appendicitis, acute 10/02/2013  . ADD 10/28/2009  . ANEMIA-UNSPECIFIED 04/28/2009  . STRESS FRACTURE, FOOT 04/08/2009  . ADULT SITUATIONAL REACTION 02/25/2009  . ALLERGIC RHINITIS 02/25/2009   Past Medical History:  Diagnosis Date  . Complication of anesthesia    slow to wake up  . Headache    migraine  . History of blood transfusion   . Pneumonia 2010  . PONV (postoperative nausea and vomiting)   . Seasonal allergies     No family history on file.  Past Surgical History:  Procedure Laterality Date  .  APPENDECTOMY    . CHOLECYSTECTOMY    . EYE SURGERY Bilateral    PRK  . Labrum Repair Right 2000   Open  . LAPAROSCOPIC APPENDECTOMY N/A 09/02/2013   Procedure: APPENDECTOMY LAPAROSCOPIC;  Surgeon: Harl Bowie, MD;  Location: [facility];  Service: General;  Laterality: N/A;  . SHOULDER ARTHROSCOPY Left 2005   bone spurs   . SHOULDER ARTHROSCOPY WITH SUBACROMIAL DECOMPRESSION, ROTATOR CUFF REPAIR AND BICEP TENDON REPAIR Right 02/24/2016   Procedure: RIGHT SHOULDER ARTHROSCOPY WITH DEBRIDEMENT, BICEPS TENODESIS;  Surgeon: Meredith Pel,  MD;  Location: [facility];  Service: Orthopedics;  Laterality: Right;  . SHOULDER SURGERY Right 2007   Revision and repair flap  . WISDOM TOOTH EXTRACTION     Social History   Occupational History  . Not on file.   Social History Main Topics  . Smoking status: Never Smoker  . Smokeless tobacco: Never Used  . Alcohol use Yes     Comment: ocassional - social 1- 2 drinks  . Drug use: No  . Sexual activity: Yes

## 2017-08-31 ENCOUNTER — Telehealth: Payer: Self-pay | Admitting: Student

## 2017-08-31 NOTE — Telephone Encounter (Signed)
Premedication prescription was called in to [facility] for fluoro-guided procedure planned 09/05/17 at 10AM.  Attempted to notify patient, however voicemail full.   Brynda Greathouse, MS RD PA-C 2:31 PM

## 2017-09-01 ENCOUNTER — Ambulatory Visit (INDEPENDENT_AMBULATORY_CARE_PROVIDER_SITE_OTHER): Payer: 59 | Admitting: Orthopedic Surgery

## 2017-09-01 ENCOUNTER — Ambulatory Visit (INDEPENDENT_AMBULATORY_CARE_PROVIDER_SITE_OTHER): Payer: 59

## 2017-09-01 ENCOUNTER — Encounter (INDEPENDENT_AMBULATORY_CARE_PROVIDER_SITE_OTHER): Payer: Self-pay | Admitting: Orthopedic Surgery

## 2017-09-01 DIAGNOSIS — M5410 Radiculopathy, site unspecified: Secondary | ICD-10-CM

## 2017-09-01 DIAGNOSIS — M25511 Pain in right shoulder: Secondary | ICD-10-CM | POA: Diagnosis not present

## 2017-09-01 MED ORDER — METHYLPREDNISOLONE 4 MG PO TABS: ORAL_TABLET | ORAL | 0 refills | Status: DC

## 2017-09-03 NOTE — Progress Notes (Signed)
Office Visit Note   Patient: Emma Mathews           Date of Birth: 19-Sep-1976           MRN: [id number] Visit Date: 09/01/2017 Requested by: Robyn Haber, MD [address] [place], [place] [address] PCP: Robyn Haber, MD  Subjective: Chief Complaint  Patient presents with  . Right Shoulder - Pain    HPI: [name] is a 41 year old female with right shoulder acute pain.she woke up yesterday morning with pain. She describes stiffness and radicular pain with numbness and tingling extending down the right arm.  She denies any history of injury.  She cannot lift her arm without pain.  She reports neck pain also.  She states that her shoulder feels loose.  She states her shoulder is but not tender to touch.  Advil has not helped.  Topical anti-inflammatory has not helped.  She has no history of gout or pseudogout. Abduction of the shoulder is painful.  She reports new catching in the shoulder.              ROS: All systems reviewed are negative as they relate to the chief complaint within the history of present illness.  Patient denies  fevers or chills.   Assessment & Plan: Visit Diagnoses:  1. Acute pain of right shoulder   2. Radicular pain     Plan: impression is atypical and debilitating right shoulder and arm pain in a patient who has had previous biceps tenodesis in the right shoulder.  She does not report fevers or chills.  She reports mechanical symptoms in the shoulder but also neck pain axillary painand radicular pain affecting the right arm.  Plan is M>  MRI C-spine to evaluate right radiculopathy.  MRI arthrogram right shoulder to evaluate the joint space.  This pain is debilitating for her.  It is hard for her to work and sleep.  It is a diagnostic dilemma.  Follow-Up Instructions: Return for after MRI.   Orders:  Orders Placed This Encounter  Procedures  . XR Shoulder Right  . XR Cervical Spine 2 or 3 views  . MR Cervical Spine w/o contrast  . MR SHOULDER  RIGHT W CONTRAST  . Arthrogram   Meds ordered this encounter  Medications  . methylPREDNISolone (MEDROL) 4 MG tablet    Sig: 6 day dose pak as directed    Dispense:  21 tablet    Refill:  0      Procedures: No procedures performed   Clinical Data: No additional findings.  Objective: Vital Signs: There were no vitals taken for this visit.  Physical Exam:   Constitutional: Patient appears well-developed HEENT:  Head: Normocephalic Eyes:EOM are normal Neck: Normal range of motion Cardiovascular: Normal rate Pulmonary/chest: Effort normal Neurologic: Patient is alert Skin: Skin is warm Psychiatric: Patient has normal mood and affect    Ortho Exam: rthopedic exam demonstrates painful shoulder passive range of motion above 90 of abduction.  No real warmth to the right shoulder versus the left. There is new coarseness in the right shoulder which has not been present before.  Biceps tenodesis intact on the right.  Paresthesias in the C5 distribution present on the right.  There is some pain with neck range of motion particularly rotation to the right.  Radial pulse intact.  No lymphadenopathy present.  Specialty Comments:  No specialty comments available.  Imaging: No results found.   PMFS History: Patient Active Problem List   Diagnosis  Date Noted  . Appendicitis, acute 10/02/2013  . ADD 10/28/2009  . ANEMIA-UNSPECIFIED 04/28/2009  . STRESS FRACTURE, FOOT 04/08/2009  . ADULT SITUATIONAL REACTION 02/25/2009  . ALLERGIC RHINITIS 02/25/2009   Past Medical History:  Diagnosis Date  . Complication of anesthesia    slow to wake up  . Headache    migraine  . History of blood transfusion   . Pneumonia 2010  . PONV (postoperative nausea and vomiting)   . Seasonal allergies     No family history on file.  Past Surgical History:  Procedure Laterality Date  . APPENDECTOMY    . CHOLECYSTECTOMY    . EYE SURGERY Bilateral    PRK  . Labrum Repair Right 2000   Open    . LAPAROSCOPIC APPENDECTOMY N/A 09/02/2013   Procedure: APPENDECTOMY LAPAROSCOPIC;  Surgeon: Harl Bowie, MD;  Location: [facility];  Service: General;  Laterality: N/A;  . SHOULDER ARTHROSCOPY Left 2005   bone spurs   . SHOULDER ARTHROSCOPY WITH SUBACROMIAL DECOMPRESSION, ROTATOR CUFF REPAIR AND BICEP TENDON REPAIR Right 02/24/2016   Procedure: RIGHT SHOULDER ARTHROSCOPY WITH DEBRIDEMENT, BICEPS TENODESIS;  Surgeon: Meredith Pel, MD;  Location: [facility];  Service: Orthopedics;  Laterality: Right;  . SHOULDER SURGERY Right 2007   Revision and repair flap  . WISDOM TOOTH EXTRACTION     Social History   Occupational History  . Not on file.   Social History Main Topics  . Smoking status: Never Smoker  . Smokeless tobacco: Never Used  . Alcohol use Yes     Comment: ocassional - social 1- 2 drinks  . Drug use: No  . Sexual activity: Yes

## 2017-09-05 ENCOUNTER — Ambulatory Visit (HOSPITAL_COMMUNITY): Admission: RE | Admit: 2017-09-05 | Payer: 59 | Source: Ambulatory Visit

## 2017-09-05 ENCOUNTER — Ambulatory Visit (HOSPITAL_COMMUNITY)
Admission: RE | Admit: 2017-09-05 | Discharge: 2017-09-05 | Disposition: A | Discharge status: Home / Self Care | Payer: 59 | Source: Ambulatory Visit | Attending: Orthopedic Surgery | Admitting: Orthopedic Surgery

## 2017-09-05 ENCOUNTER — Telehealth (INDEPENDENT_AMBULATORY_CARE_PROVIDER_SITE_OTHER): Payer: Self-pay | Admitting: Radiology

## 2017-09-05 DIAGNOSIS — M47813 Spondylosis without myelopathy or radiculopathy, cervicothoracic region: Secondary | ICD-10-CM | POA: Diagnosis not present

## 2017-09-05 DIAGNOSIS — M542 Cervicalgia: Secondary | ICD-10-CM | POA: Diagnosis not present

## 2017-09-05 DIAGNOSIS — M5410 Radiculopathy, site unspecified: Secondary | ICD-10-CM

## 2017-09-05 NOTE — Telephone Encounter (Signed)
Patient called in regards to her MRI orders.  She really wanted to get all three MRI's done in same day.  I have advised her at this point that is not possible for today.  She wants to cancel the MRI arthrogram wrist today and do the Cspine MRI instead.  I have called [facility] scheduling and they have switched this in the computer, and pt will have the MRI Cspine done today.  She will decide on the other MRI scans once the results are reviewed on the MRI Cspine.

## 2017-09-15 ENCOUNTER — Ambulatory Visit (INDEPENDENT_AMBULATORY_CARE_PROVIDER_SITE_OTHER): Payer: 59 | Admitting: Orthopedic Surgery

## 2017-09-15 ENCOUNTER — Encounter (INDEPENDENT_AMBULATORY_CARE_PROVIDER_SITE_OTHER): Payer: Self-pay | Admitting: Orthopedic Surgery

## 2017-09-15 ENCOUNTER — Telehealth (INDEPENDENT_AMBULATORY_CARE_PROVIDER_SITE_OTHER): Payer: Self-pay

## 2017-09-15 DIAGNOSIS — M25531 Pain in right wrist: Secondary | ICD-10-CM

## 2017-09-15 DIAGNOSIS — M25511 Pain in right shoulder: Secondary | ICD-10-CM | POA: Diagnosis not present

## 2017-09-15 MED ORDER — DICLOFENAC SODIUM 2 % TD SOLN: 2.0000 | Freq: Two times a day (BID) | TRANSDERMAL | 0 refills | Status: DC | PRN

## 2017-09-15 NOTE — Telephone Encounter (Signed)
Patient was seen in office today for follow up for cervical MRI scan. She initially was to have MR arthrogram of her right wrist as well as her right shoulder also. However was unable to get all 3 scheduled at the same time so she opted for the MR of her neck. She is the patient who has to have pre-med due to contrast allergy and has to take whole day off of work. She would like to proceed with the 2 arthrograms if she could get them scheduled at the same time. Is there any way that you could see what you could get worked out? Thanks so much.

## 2017-09-18 NOTE — Progress Notes (Addendum)
Office Visit Note   Patient: Emma Mathews           Date of Birth: Apr 09, 1976           MRN: [id number] Visit Date: 09/15/2017 Requested by: Robyn Haber, MD [address] [place], [place] [address] PCP: Robyn Haber, MD  Subjective: Chief Complaint  Patient presents with  . Neck - Follow-up    HPI: Emma Mathews is a patient with neck pain as well as right arm shoulder and wrist pain.  Since I have seen her she has had an MRI of her cervical spine.  She is doing some better with her neck.  States that her right wrist is "tingly" but better.  Her shoulder is still very painful anteriorly around the biceps region.  Topical anti-inflammatory is helping.  She still has a right wrist and right shoulder MRI arthrogram pending.              ROS: All systems reviewed are negative as they relate to the chief complaint within the history of present illness.  Patient denies  fevers or chills.   Assessment & Plan: Visit Diagnoses:  1. Acute pain of right shoulder   2. Pain in right wrist     Plan: Impression is neck pain with C5-6 mild disc bulging with no definite nerve compression.  At this point I think her shoulder may be more problematic.  Looked at the subscapularis today with ultrasound and it was intact.  Biceps also was located in the bicipital groove status post tenodesis.  No Popeye deformity.  She may have partial thickness anterior supraspinatus rotator cuff tear or traumatic bursitis.  Plan is subacromial injection today with follow-up after wrist and shoulder MRI scan.  Does not look like the neck is a problem at this time.  Follow-Up Instructions: Return for after MRI.   Orders:  No orders of the defined types were placed in this encounter.  Meds ordered this encounter  Medications  . Diclofenac Sodium (PENNSAID) 2 % SOLN    Sig: Place 2 Squirts onto the skin 2 (two) times daily as needed.    Dispense:  1 Bottle    Refill:  0      Procedures: Large Joint Inj:  R subacromial bursa on 09/19/2017 8:15 PM Indications: diagnostic evaluation and pain Details: 18 G 1.5 in needle, posterior approach  Arthrogram: No  Medications: 9 mL bupivacaine 0.5 %; 40 mg methylPREDNISolone acetate 40 MG/ML; 5 mL lidocaine 1 % Outcome: tolerated well, no immediate complications Procedure, treatment alternatives, risks and benefits explained, specific risks discussed. Consent was given by the patient. Immediately prior to procedure a time out was called to verify the correct patient, procedure, equipment, support staff and site/side marked as required. Patient was prepped and draped in the usual sterile fashion.       Clinical Data: No additional findings.  Objective: Vital Signs: There were no vitals taken for this visit.  Physical Exam:   Constitutional: Patient appears well-developed HEENT:  Head: Normocephalic Eyes:EOM are normal Neck: Normal range of motion Cardiovascular: Normal rate Pulmonary/chest: Effort normal Neurologic: Patient is alert Skin: Skin is warm Psychiatric: Patient has normal mood and affect    Ortho Exam: Orthopedic exam demonstrates full active and passive range of motion of the cervical spine.  Patient has intact grip EPL FPL wrist extension and wrist flexion biceps triceps and deltoid strength.  Most of her pain in the shoulder localizes anteriorly but she has good rotator  cuff strength on the right to isolated infraspinatus supraspinatus and subscap muscle testing.  No masses lymph adenopathy or skin changes noted in the shoulder region.  Radial pulses intact bilaterally with symmetric reflexes.  Wrist still has some ulnar sided tenderness and a little bit of crepitus with wrist flexion and extension localizing more to the ulnar than radial side of the wrist.  There is no snuffbox tenderness noted first dorsal compartment tenderness.  Specialty Comments:  No specialty comments available.  Imaging: No results found.   PMFS  History: Patient Active Problem List   Diagnosis Date Noted  . Appendicitis, acute 10/02/2013  . ADD 10/28/2009  . ANEMIA-UNSPECIFIED 04/28/2009  . STRESS FRACTURE, FOOT 04/08/2009  . ADULT SITUATIONAL REACTION 02/25/2009  . ALLERGIC RHINITIS 02/25/2009   Past Medical History:  Diagnosis Date  . Complication of anesthesia    slow to wake up  . Headache    migraine  . History of blood transfusion   . Pneumonia 2010  . PONV (postoperative nausea and vomiting)   . Seasonal allergies     No family history on file.  Past Surgical History:  Procedure Laterality Date  . APPENDECTOMY    . CHOLECYSTECTOMY    . EYE SURGERY Bilateral    PRK  . Labrum Repair Right 2000   Open  . SHOULDER ARTHROSCOPY Left 2005   bone spurs   . SHOULDER SURGERY Right 2007   Revision and repair flap  . WISDOM TOOTH EXTRACTION     Social History   Occupational History  . Not on file  Tobacco Use  . Smoking status: Never Smoker  . Smokeless tobacco: Never Used  Substance and Sexual Activity  . Alcohol use: Yes    Comment: ocassional - social 1- 2 drinks  . Drug use: No  . Sexual activity: Yes

## 2017-09-19 DIAGNOSIS — M25511 Pain in right shoulder: Secondary | ICD-10-CM | POA: Diagnosis not present

## 2017-09-19 MED ORDER — METHYLPREDNISOLONE ACETATE 40 MG/ML IJ SUSP
40.0000 mg | INTRAMUSCULAR | Status: AC | PRN
  Administered 2017-09-19: 40 mg via INTRA_ARTICULAR

## 2017-09-19 MED ORDER — BUPIVACAINE HCL 0.5 % IJ SOLN
9.0000 mL | INTRAMUSCULAR | Status: AC | PRN
  Administered 2017-09-19: 9 mL via INTRA_ARTICULAR

## 2017-09-19 MED ORDER — LIDOCAINE HCL 1 % IJ SOLN
5.0000 mL | INTRAMUSCULAR | Status: AC | PRN
  Administered 2017-09-19: 5 mL

## 2017-09-22 NOTE — Telephone Encounter (Signed)
Sorry this took awhile, pt is scheduled for Nov 21st at 1pm at [facility], pt is aware of apt

## 2017-09-30 ENCOUNTER — Telehealth: Payer: Self-pay | Admitting: Radiology

## 2017-09-30 MED FILL — predniSONE 50 MG TABS: 50 | 2 days supply | Qty: 3 | Fill #0

## 2017-09-30 NOTE — Progress Notes (Signed)
   Pt is scheduled for MR arthrogram Rt shoulder 11/21 100 pm Allergic to contrast Confirmed with Dr Maree Erie need for pre medications  Called Pred 50 mg #3 Benadryl 50 mg #1 (dye free)  Called LM to pt Rx was called to [facility]

## 2017-10-05 ENCOUNTER — Ambulatory Visit (HOSPITAL_COMMUNITY)
Admission: RE | Admit: 2017-10-05 | Discharge: 2017-10-05 | Disposition: A | Discharge status: Home / Self Care | Payer: 59 | Source: Ambulatory Visit | Attending: Orthopedic Surgery | Admitting: Orthopedic Surgery

## 2017-10-05 DIAGNOSIS — M949 Disorder of cartilage, unspecified: Secondary | ICD-10-CM | POA: Insufficient documentation

## 2017-10-05 DIAGNOSIS — S63511A Sprain of carpal joint of right wrist, initial encounter: Secondary | ICD-10-CM | POA: Diagnosis not present

## 2017-10-05 DIAGNOSIS — X58XXXA Exposure to other specified factors, initial encounter: Secondary | ICD-10-CM | POA: Diagnosis not present

## 2017-10-05 DIAGNOSIS — M25531 Pain in right wrist: Secondary | ICD-10-CM | POA: Diagnosis not present

## 2017-10-05 DIAGNOSIS — M7581 Other shoulder lesions, right shoulder: Secondary | ICD-10-CM | POA: Diagnosis not present

## 2017-10-05 DIAGNOSIS — Z91041 Radiographic dye allergy status: Secondary | ICD-10-CM | POA: Insufficient documentation

## 2017-10-05 DIAGNOSIS — M19011 Primary osteoarthritis, right shoulder: Secondary | ICD-10-CM | POA: Diagnosis not present

## 2017-10-05 DIAGNOSIS — M25511 Pain in right shoulder: Secondary | ICD-10-CM

## 2017-10-05 MED ORDER — IOPAMIDOL (ISOVUE-M 200) INJECTION 41%
10.0000 mL | Freq: Once | INTRAMUSCULAR | Status: AC
  Administered 2017-10-05: 15 mL via INTRA_ARTICULAR

## 2017-10-05 MED ORDER — LIDOCAINE HCL (PF) 1 % IJ SOLN
10.0000 mL | Freq: Once | INTRAMUSCULAR | Status: AC
  Administered 2017-10-05: 10 mL

## 2017-10-05 MED ORDER — GADOBENATE DIMEGLUMINE 529 MG/ML IV SOLN
5.0000 mL | Freq: Once | INTRAVENOUS | Status: AC | PRN
  Administered 2017-10-05: 1 mL via INTRA_ARTICULAR

## 2017-10-12 ENCOUNTER — Ambulatory Visit (INDEPENDENT_AMBULATORY_CARE_PROVIDER_SITE_OTHER): Payer: 59 | Admitting: Orthopedic Surgery

## 2017-10-12 ENCOUNTER — Encounter (INDEPENDENT_AMBULATORY_CARE_PROVIDER_SITE_OTHER): Payer: Self-pay | Admitting: Orthopedic Surgery

## 2017-10-12 DIAGNOSIS — M25531 Pain in right wrist: Secondary | ICD-10-CM | POA: Diagnosis not present

## 2017-10-12 NOTE — Progress Notes (Signed)
Office Visit Note   Patient: Emma Mathews           Date of Birth: 1975/12/29           MRN: [id number] Visit Date: 10/12/2017 Requested by: Emma Haber, MD [address] [place], [place] [address] PCP: Emma Haber, MD  Subjective: Chief Complaint  Patient presents with  . Right Shoulder - Follow-up  . Right Wrist - Follow-up    HPI: Emma Mathews is a 41 year old patient with right wrist and right shoulder pain.  Since I have last seen her she has had MRI scan of the right wrist and right shoulder.  Those studies are reviewed today with the patient.  Right shoulder MRI scan shows intact rotator cuff early degenerative changes in the superior humeral head and evidence of prior biceps tenodesis and labral surgery.  She states importantly that the subacromial injection performed before she went to [place] for vacation helped significantly.  She had complete pain relief.  The pain recurred about 10 days after the injection.  She denies any history of injury to the shoulder and it did not hurt at the time of the fall when she hurt her wrist.  Instead the right shoulder pain began about 3 or 4 days after the wrist pain following the fall.  In regards to the right wrist she's no better and no worse.  She has continued mechanical symptoms 10 times day in that right wrist.  MRI scan of the wrist shows punctate tear of the TFCC radial attachment as well as tearing of the volar portion of the lunotriquetral ligament.  She states that it is hard for her to push or self up from a chair using her hands because this loads the wrist in dorsiflexion.  Instead she has to use her fingers.              ROS: All systems reviewed are negative as they relate to the chief complaint within the history of present illness.  Patient denies  fevers or chills.   Assessment & Plan: Visit Diagnoses:  1. Right wrist pain     Plan: Impression is right shoulder pain with good relief from subacromial injection.   There has been some leakage of the dye from the intra-articular space into the subacromial space.  No discrete full-thickness rotator cuff tear is identified.  I think that her shoulder pain may be a combination of bursitis and osteoarthritis.  The arthritis I would not expect to be improved by subacromial injection.  I would favor one more injection before considering surgical treatment for the shoulder.  It may be that she has an occult rotator cuff tear which is just not visible on the MRI scan.  Plan for repeat evaluation and injection in the shoulder as a planned injection 10/29/2017.  In regards to the wrist I think she needs evaluation by hand surgeon for surgical evaluation for arthroscopy and debridement.  I'll send her to Dr. Burney Gauze for that  Follow-Up Instructions: Return 10/29/2017.   Orders:  Orders Placed This Encounter  Procedures  . Ambulatory referral to Orthopedic Surgery   No orders of the defined types were placed in this encounter.     Procedures: No procedures performed   Clinical Data: No additional findings.  Objective: Vital Signs: There were no vitals taken for this visit.  Physical Exam:   Constitutional: Patient appears well-developed HEENT:  Head: Normocephalic Eyes:EOM are normal Neck: Normal range of motion Cardiovascular: Normal rate Pulmonary/chest:  Effort normal Neurologic: Patient is alert Skin: Skin is warm Psychiatric: Patient has normal mood and affect    Ortho Exam: Orthopedic exam demonstrates ulnar-sided wrist pain worse with ulnar deviation on the right-hand side.  Range of motion has restored to near normal.  Grip strength is intact.  Examination of the shoulder demonstrates full active and passive range of motion with no evidence of frozen shoulder.  Rotator cuff strength intact to infraspinatus super space and subscap muscle testing.  No other masses lined adenopathy or skin changes noted in the shoulder girdle region  Specialty  Comments:  No specialty comments available.  Imaging: No results found.   PMFS History: Patient Active Problem List   Diagnosis Date Noted  . Appendicitis, acute 10/02/2013  . ADD 10/28/2009  . ANEMIA-UNSPECIFIED 04/28/2009  . STRESS FRACTURE, FOOT 04/08/2009  . ADULT SITUATIONAL REACTION 02/25/2009  . ALLERGIC RHINITIS 02/25/2009   Past Medical History:  Diagnosis Date  . Complication of anesthesia    slow to wake up  . Headache    migraine  . History of blood transfusion   . Pneumonia 2010  . PONV (postoperative nausea and vomiting)   . Seasonal allergies     History reviewed. No pertinent family history.  Past Surgical History:  Procedure Laterality Date  . APPENDECTOMY    . CHOLECYSTECTOMY    . EYE SURGERY Bilateral    PRK  . Labrum Repair Right 2000   Open  . LAPAROSCOPIC APPENDECTOMY N/A 09/02/2013   Procedure: APPENDECTOMY LAPAROSCOPIC;  Surgeon: Harl Bowie, MD;  Location: [facility];  Service: General;  Laterality: N/A;  . SHOULDER ARTHROSCOPY Left 2005   bone spurs   . SHOULDER ARTHROSCOPY WITH SUBACROMIAL DECOMPRESSION, ROTATOR CUFF REPAIR AND BICEP TENDON REPAIR Right 02/24/2016   Procedure: RIGHT SHOULDER ARTHROSCOPY WITH DEBRIDEMENT, BICEPS TENODESIS;  Surgeon: Meredith Pel, MD;  Location: [facility];  Service: Orthopedics;  Laterality: Right;  . SHOULDER SURGERY Right 2007   Revision and repair flap  . WISDOM TOOTH EXTRACTION     Social History   Occupational History  . Not on file  Tobacco Use  . Smoking status: Never Smoker  . Smokeless tobacco: Never Used  Substance and Sexual Activity  . Alcohol use: Yes    Comment: ocassional - social 1- 2 drinks  . Drug use: No  . Sexual activity: Yes

## 2017-10-14 ENCOUNTER — Encounter (INDEPENDENT_AMBULATORY_CARE_PROVIDER_SITE_OTHER): Payer: Self-pay | Admitting: Orthopedic Surgery

## 2017-10-17 NOTE — Telephone Encounter (Signed)
Not sure when the surgery could be scheduled on the shoulder but I think we could get it done by the end of the year.  The referral for the appointment with Dr. Burney Gauze was faxed this morning and he is looking for it because he has already asked me where it is.  Please conveyed this information to her thanks

## 2017-10-25 DIAGNOSIS — M24131 Other articular cartilage disorders, right wrist: Secondary | ICD-10-CM | POA: Diagnosis not present

## 2017-10-26 ENCOUNTER — Other Ambulatory Visit: Payer: Self-pay | Admitting: Orthopedic Surgery

## 2017-10-27 ENCOUNTER — Ambulatory Visit (INDEPENDENT_AMBULATORY_CARE_PROVIDER_SITE_OTHER): Payer: 59 | Admitting: Orthopedic Surgery

## 2017-10-27 ENCOUNTER — Other Ambulatory Visit (INDEPENDENT_AMBULATORY_CARE_PROVIDER_SITE_OTHER): Payer: Self-pay | Admitting: Orthopedic Surgery

## 2017-10-27 DIAGNOSIS — M7551 Bursitis of right shoulder: Secondary | ICD-10-CM

## 2017-10-28 ENCOUNTER — Encounter (HOSPITAL_COMMUNITY): Payer: Self-pay | Admitting: Urology

## 2017-10-28 ENCOUNTER — Other Ambulatory Visit: Payer: Self-pay

## 2017-10-31 MED ORDER — CEFAZOLIN SODIUM-DEXTROSE 2-4 GM/100ML-% IV SOLN
2.0000 g | INTRAVENOUS | Status: AC
  Administered 2017-11-01: 2 g via INTRAVENOUS
  Filled 2017-10-31: qty 100

## 2017-11-01 ENCOUNTER — Ambulatory Visit (HOSPITAL_COMMUNITY): Payer: 59 | Admitting: Anesthesiology

## 2017-11-01 ENCOUNTER — Encounter (HOSPITAL_COMMUNITY): Payer: Self-pay | Admitting: *Deleted

## 2017-11-01 ENCOUNTER — Encounter (INDEPENDENT_AMBULATORY_CARE_PROVIDER_SITE_OTHER): Payer: Self-pay | Admitting: Orthopedic Surgery

## 2017-11-01 ENCOUNTER — Ambulatory Visit (HOSPITAL_COMMUNITY)
Admission: RE | Admit: 2017-11-01 | Discharge: 2017-11-01 | Disposition: A | Discharge status: Home / Self Care | Payer: 59 | Source: Ambulatory Visit | Attending: Orthopedic Surgery | Admitting: Orthopedic Surgery

## 2017-11-01 ENCOUNTER — Encounter (HOSPITAL_COMMUNITY)
Admission: RE | Disposition: A | Discharge status: Home / Self Care | Payer: Self-pay | Source: Ambulatory Visit | Attending: Orthopedic Surgery

## 2017-11-01 DIAGNOSIS — F909 Attention-deficit hyperactivity disorder, unspecified type: Secondary | ICD-10-CM | POA: Diagnosis not present

## 2017-11-01 DIAGNOSIS — M7551 Bursitis of right shoulder: Secondary | ICD-10-CM | POA: Diagnosis not present

## 2017-11-01 DIAGNOSIS — Z885 Allergy status to narcotic agent status: Secondary | ICD-10-CM | POA: Insufficient documentation

## 2017-11-01 DIAGNOSIS — Z6831 Body mass index (BMI) 31.0-31.9, adult: Secondary | ICD-10-CM | POA: Diagnosis not present

## 2017-11-01 DIAGNOSIS — M65811 Other synovitis and tenosynovitis, right shoulder: Secondary | ICD-10-CM | POA: Insufficient documentation

## 2017-11-01 DIAGNOSIS — M19011 Primary osteoarthritis, right shoulder: Secondary | ICD-10-CM | POA: Diagnosis not present

## 2017-11-01 DIAGNOSIS — Z79899 Other long term (current) drug therapy: Secondary | ICD-10-CM | POA: Diagnosis not present

## 2017-11-01 DIAGNOSIS — Z91041 Radiographic dye allergy status: Secondary | ICD-10-CM | POA: Insufficient documentation

## 2017-11-01 DIAGNOSIS — G8918 Other acute postprocedural pain: Secondary | ICD-10-CM | POA: Diagnosis not present

## 2017-11-01 DIAGNOSIS — Z888 Allergy status to other drugs, medicaments and biological substances status: Secondary | ICD-10-CM | POA: Diagnosis not present

## 2017-11-01 DIAGNOSIS — Z85820 Personal history of malignant melanoma of skin: Secondary | ICD-10-CM | POA: Diagnosis not present

## 2017-11-01 DIAGNOSIS — J309 Allergic rhinitis, unspecified: Secondary | ICD-10-CM | POA: Diagnosis not present

## 2017-11-01 DIAGNOSIS — I10 Essential (primary) hypertension: Secondary | ICD-10-CM | POA: Diagnosis not present

## 2017-11-01 HISTORY — DX: Malignant (primary) neoplasm, unspecified: C80.1

## 2017-11-01 HISTORY — PX: SHOULDER ARTHROSCOPY: SHX128

## 2017-11-01 LAB — CBC
HEMATOCRIT: 45.4 % (ref 36.0–46.0)
HEMOGLOBIN: 15.4 g/dL — AB (ref 12.0–15.0)
MCH: 29.2 pg (ref 26.0–34.0)
MCHC: 33.9 g/dL (ref 30.0–36.0)
MCV: 86.1 fL (ref 78.0–100.0)
Platelets: 200 10*3/uL (ref 150–400)
RBC: 5.27 MIL/uL — ABNORMAL HIGH (ref 3.87–5.11)
RDW: 14 % (ref 11.5–15.5)
WBC: 5.4 10*3/uL (ref 4.0–10.5)

## 2017-11-01 LAB — BASIC METABOLIC PANEL
Anion gap: 8 (ref 5–15)
BUN: 13 mg/dL (ref 6–20)
CO2: 24 mmol/L (ref 22–32)
Calcium: 9.2 mg/dL (ref 8.9–10.3)
Chloride: 107 mmol/L (ref 101–111)
Creatinine, Ser: 0.68 mg/dL (ref 0.44–1.00)
GFR calc Af Amer: >60 mL/min (ref >60)
GFR calc non Af Amer: >60 mL/min (ref >60)
Glucose, Bld: 88 mg/dL (ref 65–99)
Potassium: 3.6 mmol/L (ref 3.5–5.1)
Sodium: 139 mmol/L (ref 135–145)

## 2017-11-01 LAB — HCG, SERUM, QUALITATIVE: Preg, Serum: NEGATIVE

## 2017-11-01 SURGERY — ARTHROSCOPY, SHOULDER
Anesthesia: Regional | Site: Shoulder | Laterality: Right

## 2017-11-01 MED ORDER — BUPIVACAINE HCL (PF) 0.25 % IJ SOLN
INTRAMUSCULAR | Status: AC
  Filled 2017-11-01: qty 30

## 2017-11-01 MED ORDER — MEPERIDINE HCL 25 MG/ML IJ SOLN: 6.2500 mg | INTRAMUSCULAR | Status: DC | PRN

## 2017-11-01 MED ORDER — ROCURONIUM BROMIDE 10 MG/ML (PF) SYRINGE
PREFILLED_SYRINGE | INTRAVENOUS | Status: AC
  Filled 2017-11-01: qty 5

## 2017-11-01 MED ORDER — HYDROMORPHONE HCL 1 MG/ML IJ SOLN: 0.2500 mg | INTRAMUSCULAR | Status: DC | PRN

## 2017-11-01 MED ORDER — CHLORHEXIDINE GLUCONATE 4 % EX LIQD: 60.0000 mL | Freq: Once | CUTANEOUS | Status: DC

## 2017-11-01 MED ORDER — GLYCOPYRROLATE 0.2 MG/ML IJ SOLN
INTRAMUSCULAR | Status: DC | PRN
  Administered 2017-11-01 (×2): 0.2 mg via INTRAVENOUS

## 2017-11-01 MED ORDER — PHENYLEPHRINE 40 MCG/ML (10ML) SYRINGE FOR IV PUSH (FOR BLOOD PRESSURE SUPPORT)
PREFILLED_SYRINGE | INTRAVENOUS | Status: AC
  Filled 2017-11-01: qty 10

## 2017-11-01 MED ORDER — DEXAMETHASONE SODIUM PHOSPHATE 10 MG/ML IJ SOLN
INTRAMUSCULAR | Status: AC
  Filled 2017-11-01: qty 1

## 2017-11-01 MED ORDER — MIDAZOLAM HCL 2 MG/2ML IJ SOLN
INTRAMUSCULAR | Status: AC
  Administered 2017-11-01: 2 mg via INTRAVENOUS
  Filled 2017-11-01: qty 2

## 2017-11-01 MED ORDER — EPHEDRINE SULFATE 50 MG/ML IJ SOLN
INTRAMUSCULAR | Status: DC | PRN
  Administered 2017-11-01: 15 mg via INTRAVENOUS
  Administered 2017-11-01: 10 mg via INTRAVENOUS

## 2017-11-01 MED ORDER — SCOPOLAMINE 1 MG/3DAYS TD PT72
1.0000 | MEDICATED_PATCH | Freq: Once | TRANSDERMAL | Status: DC
  Administered 2017-11-01: 1.5 mg via TRANSDERMAL
  Filled 2017-11-01: qty 1

## 2017-11-01 MED ORDER — MIDAZOLAM HCL 2 MG/2ML IJ SOLN
INTRAMUSCULAR | Status: AC
  Filled 2017-11-01: qty 2

## 2017-11-01 MED ORDER — MIDAZOLAM HCL 2 MG/2ML IJ SOLN: 0.5000 mg | Freq: Once | INTRAMUSCULAR | Status: DC | PRN

## 2017-11-01 MED ORDER — SODIUM CHLORIDE 0.9 % IR SOLN
Status: DC | PRN
  Administered 2017-11-01: 3000 mL

## 2017-11-01 MED ORDER — SUGAMMADEX SODIUM 200 MG/2ML IV SOLN
INTRAVENOUS | Status: DC | PRN
  Administered 2017-11-01: 200 mg via INTRAVENOUS

## 2017-11-01 MED ORDER — ONDANSETRON HCL 4 MG/2ML IJ SOLN
INTRAMUSCULAR | Status: AC
  Filled 2017-11-01: qty 2

## 2017-11-01 MED ORDER — PHENYLEPHRINE HCL 10 MG/ML IJ SOLN
INTRAMUSCULAR | Status: DC | PRN
  Administered 2017-11-01: 120 ug via INTRAVENOUS

## 2017-11-01 MED ORDER — FENTANYL CITRATE (PF) 100 MCG/2ML IJ SOLN
INTRAMUSCULAR | Status: AC
  Administered 2017-11-01: 100 ug via INTRAVENOUS
  Filled 2017-11-01: qty 2

## 2017-11-01 MED ORDER — EPINEPHRINE PF 1 MG/ML IJ SOLN
INTRAMUSCULAR | Status: AC
  Filled 2017-11-01: qty 3

## 2017-11-01 MED ORDER — BUPIVACAINE HCL (PF) 0.25 % IJ SOLN
INTRAMUSCULAR | Status: DC | PRN
  Administered 2017-11-01: 10 mL

## 2017-11-01 MED ORDER — PHENYLEPHRINE HCL 10 MG/ML IJ SOLN
INTRAVENOUS | Status: DC | PRN
  Administered 2017-11-01: 10 ug/min via INTRAVENOUS

## 2017-11-01 MED ORDER — SODIUM CHLORIDE 0.9 % IJ SOLN
INTRAMUSCULAR | Status: DC | PRN
  Administered 2017-11-01: 50 mL

## 2017-11-01 MED ORDER — PROMETHAZINE HCL 25 MG/ML IJ SOLN: 6.2500 mg | INTRAMUSCULAR | Status: DC | PRN

## 2017-11-01 MED ORDER — BUPIVACAINE-EPINEPHRINE (PF) 0.5% -1:200000 IJ SOLN
INTRAMUSCULAR | Status: DC | PRN
  Administered 2017-11-01: 25 mL via PERINEURAL

## 2017-11-01 MED ORDER — EPINEPHRINE PF 1 MG/ML IJ SOLN
INTRAMUSCULAR | Status: AC
  Filled 2017-11-01: qty 1

## 2017-11-01 MED ORDER — SUGAMMADEX SODIUM 200 MG/2ML IV SOLN
INTRAVENOUS | Status: AC
  Filled 2017-11-01: qty 2

## 2017-11-01 MED ORDER — ROCURONIUM BROMIDE 100 MG/10ML IV SOLN
INTRAVENOUS | Status: DC | PRN
  Administered 2017-11-01: 50 mg via INTRAVENOUS

## 2017-11-01 MED ORDER — LACTATED RINGERS IV SOLN
INTRAVENOUS | Status: DC
  Administered 2017-11-01 (×3): via INTRAVENOUS

## 2017-11-01 MED ORDER — FENTANYL CITRATE (PF) 100 MCG/2ML IJ SOLN
100.0000 ug | Freq: Once | INTRAMUSCULAR | Status: AC
  Administered 2017-11-01: 100 ug via INTRAVENOUS

## 2017-11-01 MED ORDER — FENTANYL CITRATE (PF) 100 MCG/2ML IJ SOLN
INTRAMUSCULAR | Status: DC | PRN
  Administered 2017-11-01: 150 ug via INTRAVENOUS

## 2017-11-01 MED ORDER — PROPOFOL 10 MG/ML IV BOLUS
INTRAVENOUS | Status: AC
  Filled 2017-11-01: qty 40

## 2017-11-01 MED ORDER — MIDAZOLAM HCL 2 MG/2ML IJ SOLN
2.0000 mg | Freq: Once | INTRAMUSCULAR | Status: AC
  Administered 2017-11-01: 2 mg via INTRAVENOUS

## 2017-11-01 MED ORDER — EPINEPHRINE PF 1 MG/ML IJ SOLN
INTRAMUSCULAR | Status: DC | PRN
  Administered 2017-11-01: 1 mg via INTRAMUSCULAR

## 2017-11-01 MED ORDER — DEXAMETHASONE SODIUM PHOSPHATE 10 MG/ML IJ SOLN
INTRAMUSCULAR | Status: DC | PRN
  Administered 2017-11-01: 10 mg via INTRAVENOUS

## 2017-11-01 MED ORDER — FENTANYL CITRATE (PF) 250 MCG/5ML IJ SOLN
INTRAMUSCULAR | Status: AC
  Filled 2017-11-01: qty 5

## 2017-11-01 MED ORDER — EPHEDRINE 5 MG/ML INJ
INTRAVENOUS | Status: AC
  Filled 2017-11-01: qty 10

## 2017-11-01 MED ORDER — ONDANSETRON HCL 4 MG/2ML IJ SOLN
INTRAMUSCULAR | Status: DC | PRN
  Administered 2017-11-01: 4 mg via INTRAVENOUS

## 2017-11-01 MED ORDER — PROPOFOL 10 MG/ML IV BOLUS
INTRAVENOUS | Status: DC | PRN
  Administered 2017-11-01: 200 mg via INTRAVENOUS

## 2017-11-01 MED FILL — PROMETHAZINE 25 MG TABLET: 25 | 10 days supply | Qty: 30 | Fill #0

## 2017-11-01 MED FILL — oxyCODONE HCL 5 MG TABS: 5 | 6 days supply | Qty: 40 | Fill #0

## 2017-11-01 SURGICAL SUPPLY — 55 items
AID PSTN UNV HD RSTRNT DISP (MISCELLANEOUS) ×1
ALCOHOL ISOPROPYL (RUBBING) (MISCELLANEOUS) ×2 IMPLANT
BLADE CUTTER GATOR 3.5 (BLADE) ×2 IMPLANT
BLADE SURG 11 STRL SS (BLADE) ×2 IMPLANT
BUR OVAL 6.0 (BURR) ×2 IMPLANT
COVER SURGICAL LIGHT HANDLE (MISCELLANEOUS) ×2 IMPLANT
DRAPE IMP U-DRAPE 54X76 (DRAPES) ×2 IMPLANT
DRAPE INCISE IOBAN 66X45 STRL (DRAPES) ×2 IMPLANT
DRAPE STERI 35X30 U-POUCH (DRAPES) ×2 IMPLANT
DRAPE U-SHAPE 47X51 STRL (DRAPES) ×4 IMPLANT
DRSG AQUACEL AG ADV 3.5X 4 (GAUZE/BANDAGES/DRESSINGS) ×3 IMPLANT
DURAPREP 26ML APPLICATOR (WOUND CARE) ×2 IMPLANT
ELECT REM PT RETURN 9FT ADLT (ELECTROSURGICAL) ×2
ELECTRODE REM PT RTRN 9FT ADLT (ELECTROSURGICAL) ×1 IMPLANT
FILTER STRAW FLUID ASPIR (MISCELLANEOUS) ×2 IMPLANT
GAUZE XEROFORM 1X8 LF (GAUZE/BANDAGES/DRESSINGS) ×2 IMPLANT
GLOVE BIOGEL PI IND STRL 8 (GLOVE) ×1 IMPLANT
GLOVE BIOGEL PI INDICATOR 8 (GLOVE) ×1
GLOVE SURG ORTHO 8.0 STRL STRW (GLOVE) ×2 IMPLANT
GOWN STRL REUS W/ TWL LRG LVL3 (GOWN DISPOSABLE) ×3 IMPLANT
GOWN STRL REUS W/TWL LRG LVL3 (GOWN DISPOSABLE) ×6
KIT BASIN OR (CUSTOM PROCEDURE TRAY) ×2 IMPLANT
KIT ROOM TURNOVER OR (KITS) ×2 IMPLANT
MANIFOLD NEPTUNE II (INSTRUMENTS) ×2 IMPLANT
NDL HYPO 25X1 1.5 SAFETY (NEEDLE) ×1 IMPLANT
NDL SPNL 18GX3.5 QUINCKE PK (NEEDLE) ×1 IMPLANT
NEEDLE HYPO 25X1 1.5 SAFETY (NEEDLE) ×2 IMPLANT
NEEDLE SPNL 18GX3.5 QUINCKE PK (NEEDLE) ×2 IMPLANT
NS IRRIG 1000ML POUR BTL (IV SOLUTION) ×2 IMPLANT
PACK SHOULDER (CUSTOM PROCEDURE TRAY) ×2 IMPLANT
PAD ARMBOARD 7.5X6 YLW CONV (MISCELLANEOUS) ×4 IMPLANT
RESTRAINT HEAD UNIVERSAL NS (MISCELLANEOUS) ×2 IMPLANT
SET ARTHROSCOPY TUBING (MISCELLANEOUS) ×2
SET ARTHROSCOPY TUBING LN (MISCELLANEOUS) ×1 IMPLANT
SLING ARM FOAM STRAP LRG (SOFTGOODS) ×1 IMPLANT
SPONGE LAP 4X18 X RAY DECT (DISPOSABLE) ×2 IMPLANT
SUCTION FRAZIER HANDLE 10FR (MISCELLANEOUS)
SUCTION TUBE FRAZIER 10FR DISP (MISCELLANEOUS) IMPLANT
SUT ETHILON 3 0 PS 1 (SUTURE) ×2 IMPLANT
SUT PROLENE 3 0 PS 2 (SUTURE) IMPLANT
SUT VIC AB 0 CT1 27 (SUTURE)
SUT VIC AB 0 CT1 27XBRD ANBCTR (SUTURE) IMPLANT
SUT VIC AB 1 CT1 27 (SUTURE)
SUT VIC AB 1 CT1 27XBRD ANBCTR (SUTURE) IMPLANT
SUT VIC AB 2-0 CT1 27 (SUTURE)
SUT VIC AB 2-0 CT1 TAPERPNT 27 (SUTURE) ×1 IMPLANT
SUT VICRYL 0 UR6 27IN ABS (SUTURE) IMPLANT
SYR 20CC LL (SYRINGE) ×4 IMPLANT
SYR TB 1ML LUER SLIP (SYRINGE) ×2 IMPLANT
TOWEL OR 17X24 6PK STRL BLUE (TOWEL DISPOSABLE) ×2 IMPLANT
TOWEL OR 17X26 10 PK STRL BLUE (TOWEL DISPOSABLE) ×2 IMPLANT
TUBE CONNECTING 12X1/4 (SUCTIONS) ×1 IMPLANT
WAND HAND CNTRL MULTIVAC 90 (MISCELLANEOUS) ×1 IMPLANT
WAND STAR VAC 90 (SURGICAL WAND) ×2 IMPLANT
WATER STERILE IRR 1000ML POUR (IV SOLUTION) ×2 IMPLANT

## 2017-11-01 NOTE — Anesthesia Procedure Notes (Signed)
Anesthesia Regional Block: Interscalene brachial plexus block   Pre-Anesthetic Checklist: ,, timeout performed, Correct Patient, Correct Site, Correct Laterality, Correct Procedure, Correct Position, site marked, Risks and benefits discussed,  Surgical consent,  Pre-op evaluation,  At surgeon's request and post-op pain management  Laterality: Right and Upper  Prep: chloraprep       Needles:  Injection technique: Single-shot  Needle Type: Echogenic Stimulator Needle     Needle Length: 9cm  Needle Gauge: 21     Additional Needles:   Procedures:, nerve stimulator,,, ultrasound used (permanent image in chart),,,,   Nerve Stimulator or Paresthesia:  Response: forearm twitch, 0.44 mA, 0.1 ms,   Additional Responses:   Narrative:  Start time: 11/01/2017 10:10 AM End time: 11/01/2017 10:17 AM Injection made incrementally with aspirations every 5 mL.  Performed by: Personally  Anesthesiologist: Annye Asa, MD  Additional Notes: Pt identified in Holding room.  Monitors applied. Working IV access confirmed. Sterile prep, drape R neck.  #21ga ECHOgenic PNS into interscalene groove with US guidance,  to forearm twitch at 0.67mA threshold.  25cc 0.5% Bupivacaine with 1:200k epi injected incrementally after negative test dose.  Patient asymptomatic, VSS, no heme aspirated, tolerated well.  Jenita Seashore, MD

## 2017-11-01 NOTE — Brief Op Note (Signed)
11/01/2017  12:25 PM  PATIENT:  Emma Mathews  41 y.o. female  PRE-OPERATIVE DIAGNOSIS:  right shoulder bursitis  POST-OPERATIVE DIAGNOSIS:  *Right shoulder bursitis d *  PROCEDURE:  Procedure(s): RIGHT SHOULDER ARTHROSCOPY WITH SUBACROMIAL DECOMPRESSION  SURGEON:  Surgeon(s): Meredith Pel, MD  ASSISTANT: April Green RNFA  ANESTHESIA:   general  EBL: 5 cc ml    Total I/O In: 1000 [I.V.:1000] Out: -   BLOOD ADMINISTERED: none  DRAINS: none   LOCAL MEDICATIONS USED:  none  SPECIMEN:  No Specimen  COUNTS:  YES  TOURNIQUET:  * No tourniquets in log *  DICTATION: .Other Dictation: Dictation Number [id number]  PLAN OF CARE: Discharge to home after PACU  PATIENT DISPOSITION:  PACU - hemodynamically stable

## 2017-11-01 NOTE — Anesthesia Postprocedure Evaluation (Signed)
Anesthesia Post Note  Patient: Emma Mathews  Procedure(s) Performed: RIGHT SHOULDER ARTHROSCOPY WITH SUBACROMIAL DECOMPRESSION (Right Shoulder)     Patient location during evaluation: PACU Anesthesia Type: Regional and General Level of consciousness: awake and alert, oriented and patient cooperative Pain management: pain level controlled Vital Signs Assessment: post-procedure vital signs reviewed and stable Respiratory status: spontaneous breathing, nonlabored ventilation and respiratory function stable Cardiovascular status: blood pressure returned to baseline and stable Postop Assessment: no apparent nausea or vomiting Anesthetic complications: no    Last Vitals:  Vitals:   11/01/17 1245 11/01/17 1300  BP: 140/70 137/73  Pulse: 93 95  Resp: 18 17  Temp:    SpO2: 100% 100%    Last Pain:  Vitals:   11/01/17 1230  TempSrc:   PainSc: 0-No pain                 Alexarae Oliva,E. Shaylie Eklund

## 2017-11-01 NOTE — Anesthesia Preprocedure Evaluation (Addendum)
Anesthesia Evaluation  Patient identified by MRN, date of birth, ID band Patient awake    Reviewed: Allergy & Precautions, NPO status , Patient's Chart, lab work & pertinent test results  History of Anesthesia Complications (+) PONV  Airway Mallampati: I  TM Distance: >3 FB Neck ROM: Full    Dental  (+) Dental Advisory Given   Pulmonary neg pulmonary ROS,    breath sounds clear to auscultation       Cardiovascular negative cardio ROS   Rhythm:Regular Rate:Normal     Neuro/Psych  Headaches, ADHD   GI/Hepatic negative GI ROS, Neg liver ROS,   Endo/Other  Morbid obesity  Renal/GU negative Renal ROS     Musculoskeletal   Abdominal (+) + obese,   Peds  Hematology negative hematology ROS (+)   Anesthesia Other Findings   Reproductive/Obstetrics mirena                            Anesthesia Physical Anesthesia Plan  ASA: II  Anesthesia Plan: General   Post-op Pain Management: GA combined w/ Regional for post-op pain   Induction: Intravenous  PONV Risk Score and Plan: 4 or greater and Ondansetron, Dexamethasone, Midazolam and Scopolamine patch - Pre-op  Airway Management Planned: Oral ETT  Additional Equipment:   Intra-op Plan:   Post-operative Plan: Extubation in OR  Informed Consent: I have reviewed the patients History and Physical, chart, labs and discussed the procedure including the risks, benefits and alternatives for the proposed anesthesia with the patient or authorized representative who has indicated his/her understanding and acceptance.   Dental advisory given  Plan Discussed with: CRNA and Surgeon  Anesthesia Plan Comments: (Plan routine monitors, GETA with interscalene block for post op analgesia)        Anesthesia Quick Evaluation

## 2017-11-01 NOTE — Transfer of Care (Signed)
Immediate Anesthesia Transfer of Care Note  Patient: Emma Mathews  Procedure(s) Performed: RIGHT SHOULDER ARTHROSCOPY WITH SUBACROMIAL DECOMPRESSION (Right Shoulder)  Patient Location: PACU  Anesthesia Type:General and Regional  Level of Consciousness: awake and patient cooperative  Airway & Oxygen Therapy: Patient Spontanous Breathing and Patient connected to nasal cannula oxygen  Post-op Assessment: Report given to RN and Post -op Vital signs reviewed and stable  Post vital signs: Reviewed and stable  Last Vitals:  Vitals:   11/01/17 1015 11/01/17 1020  BP:    Pulse: (!) 57 73  Resp: 10 15  Temp:    SpO2: 100% 100%    Last Pain:  Vitals:   11/01/17 0828  TempSrc: Oral         Complications: No apparent anesthesia complications

## 2017-11-01 NOTE — H&P (Signed)
Emma Mathews is an 41 y.o. female.   Chief Complaint: Right shoulder pain HPI: Emma Mathews is a 41 year old female with right shoulder pain.  She has symptoms consistent with bursitis and had an excellent response to a subacromial injection.  Her pain has recurred.  She has had multiple press surgical procedures including labral stabilization and biceps tenodesis.  She denies any significant instability episodes but reports pain in the deltoid region.  MRI scan is consistent with intact rotator cuff intact labrum and bursitis.  AC joint is non-symptomatic.  Past Medical History:  Diagnosis Date  . Cancer ([place])    melanoma excision in 2013  . Complication of anesthesia    slow to wake up  . Headache    migraine  . History of blood transfusion   . Pneumonia 2010  . PONV (postoperative nausea and vomiting)   . Seasonal allergies     Past Surgical History:  Procedure Laterality Date  . APPENDECTOMY    . CHOLECYSTECTOMY    . EYE SURGERY Bilateral    PRK  . Labrum Repair Right 2000   Open  . LAPAROSCOPIC APPENDECTOMY N/A 09/02/2013   Procedure: APPENDECTOMY LAPAROSCOPIC;  Surgeon: Harl Bowie, MD;  Location: [facility];  Service: General;  Laterality: N/A;  . SHOULDER ARTHROSCOPY Left 2005   bone spurs   . SHOULDER ARTHROSCOPY WITH SUBACROMIAL DECOMPRESSION, ROTATOR CUFF REPAIR AND BICEP TENDON REPAIR Right 02/24/2016   Procedure: RIGHT SHOULDER ARTHROSCOPY WITH DEBRIDEMENT, BICEPS TENODESIS;  Surgeon: Meredith Pel, MD;  Location: [facility];  Service: Orthopedics;  Laterality: Right;  . SHOULDER SURGERY Right 2007   Revision and repair flap  . WISDOM TOOTH EXTRACTION      History reviewed. No pertinent family history. Social History:  reports that  has never smoked. she has never used smokeless tobacco. She reports that she drinks alcohol. She reports that she does not use drugs.  Allergies:  Allergies  Allergen Reactions  . Contrast Media [Iodinated Diagnostic Agents]  Shortness Of Breath, Nausea And Vomiting and Other (See Comments)    Shortness of breath  . Diphenhydramine Hcl Hives and Other (See Comments)    Pt states she can take Dye free Benadryl  . Chlorhexidine Gluconate Hives  . Codeine Hives, Nausea And Vomiting and Rash    Medications Prior to Admission  Medication Sig Dispense Refill  . levonorgestrel (MIRENA) 20 MCG/24HR IUD 1 each by Intrauterine route once.    . mometasone (NASONEX) 50 MCG/ACT nasal spray Place 2 sprays into the nose daily.    . Multiple Vitamin (MULTIVITAMIN WITH MINERALS) TABS tablet Take 1 tablet by mouth at bedtime.    . naproxen sodium (ALEVE) 220 MG tablet Take 220-440 mg by mouth 2 (two) times daily as needed (for pain.).    Marland Kitchen Diclofenac Sodium (PENNSAID) 2 % SOLN Place 2 Squirts onto the skin 2 (two) times daily as needed. (Patient not taking: Reported on 10/27/2017) 1 Bottle 0    Results for orders placed or performed during the hospital encounter of 11/01/17 (from the past 48 hour(s))  CBC     Status: Abnormal   Collection Time: 11/01/17  9:21 AM  Result Value Ref Range   WBC 5.4 4.0 - 10.5 K/uL   RBC 5.27 (H) 3.87 - 5.11 MIL/uL   Hemoglobin 15.4 (H) 12.0 - 15.0 g/dL   HCT 45.4 36.0 - 46.0 %   MCV 86.1 78.0 - 100.0 fL   MCH 29.2 26.0 - 34.0 pg  MCHC 33.9 30.0 - 36.0 g/dL   RDW 14.0 11.5 - 15.5 %   Platelets 200 150 - 400 K/uL  Basic metabolic panel     Status: None   Collection Time: 11/01/17  9:21 AM  Result Value Ref Range   Sodium 139 135 - 145 mmol/L   Potassium 3.6 3.5 - 5.1 mmol/L   Chloride 107 101 - 111 mmol/L   CO2 24 22 - 32 mmol/L   Glucose, Bld 88 65 - 99 mg/dL   BUN 13 6 - 20 mg/dL   Creatinine, Ser 0.68 0.44 - 1.00 mg/dL   Calcium 9.2 8.9 - 10.3 mg/dL   GFR calc non Af Amer >60 >60 mL/min   GFR calc Af Amer >60 >60 mL/min    Comment: (NOTE) The eGFR has been calculated using the CKD EPI equation. This calculation has not been validated in all clinical situations. eGFR's  persistently <60 mL/min signify possible Chronic Kidney Disease.    Anion gap 8 5 - 15  hCG, serum, qualitative     Status: None   Collection Time: 11/01/17  9:21 AM  Result Value Ref Range   Preg, Serum NEGATIVE NEGATIVE    Comment:        THE SENSITIVITY OF THIS METHODOLOGY IS >10 mIU/mL.    No results found.  Review of Systems  Musculoskeletal: Positive for joint pain.  All other systems reviewed and are negative.   Blood pressure 124/71, pulse 73, temperature 98.9 F (37.2 C), temperature source Oral, resp. rate 15, height '5\' 6"'$  (1.676 m), weight 198 lb (89.8 kg), SpO2 100 %. Physical Exam  Constitutional: She appears well-developed.  HENT:  Head: Normocephalic.  Eyes: Pupils are equal, round, and reactive to light.  Neck: Normal range of motion.  Cardiovascular: Normal rate.  Respiratory: Effort normal.  Neurological: She is alert.  Skin: Skin is warm.  Psychiatric: She has a normal mood and affect.  Right shoulder exam demonstrates full active and passive range of motion with good rotator cuff strength positive impingement signs and no AC joint tenderness to direct palpation.  Biceps tendon strength is intact.  Full active and passive range of motion is present.  Negative apprehension testing.  Assessment/Plan Impression is right shoulder pain with examination MRI findings and injection results consistent with bursitis.  Patient is failed conservative management and has had symptoms for 3-4 months.  Plan at this time is arthroscopy with subacromial decompression and evaluation of the joint and rotator cuff.  Anticipate bursectomy only.  Risk and benefits are discussed.  Postop rehab discussed.  All questions answered  Anderson Malta, MD 11/01/2017, 10:40 AM

## 2017-11-01 NOTE — Anesthesia Procedure Notes (Signed)
Procedure Name: Intubation Date/Time: 11/01/2017 11:03 AM Performed by: Scheryl Darter, CRNA Pre-anesthesia Checklist: Patient identified, Emergency Drugs available, Suction available and Patient being monitored Patient Re-evaluated:Patient Re-evaluated prior to induction Oxygen Delivery Method: Circle System Utilized Preoxygenation: Pre-oxygenation with 100% oxygen Induction Type: IV induction Ventilation: Mask ventilation without difficulty Grade View: Grade I Tube type: Oral Tube size: 7.0 mm Number of attempts: 1 Airway Equipment and Method: Stylet and Oral airway Placement Confirmation: ETT inserted through vocal cords under direct vision,  positive ETCO2 and breath sounds checked- equal and bilateral Secured at: 22 cm Tube secured with: Tape Dental Injury: Teeth and Oropharynx as per pre-operative assessment

## 2017-11-02 ENCOUNTER — Encounter (HOSPITAL_COMMUNITY): Payer: Self-pay | Admitting: Orthopedic Surgery

## 2017-11-02 NOTE — Op Note (Signed)
[name], [name] NO.:  [id number]  MEDICAL RECORD NO.:  [id number]  LOCATION:                                 FACILITY:  PHYSICIAN:  Anderson Malta, M.D.         DATE OF BIRTH:  DATE OF PROCEDURE: DATE OF DISCHARGE:                              OPERATIVE REPORT   PREOPERATIVE DIAGNOSIS:  Right shoulder bursitis.  POSTOPERATIVE DIAGNOSIS:  Right shoulder bursitis.  PROCEDURE:  Right shoulder arthroscopy with subacromial decompression without acromioplasty, but with bursectomy.  SURGEON:  Anderson Malta, M.D.  ASSISTANT:  April Green, RNFA.  INDICATIONS:  Emma Mathews is a 41 year old female with right shoulder pain.  Had positive response to subacromial injection.  MRI scan shows no full-thickness rotator cuff tear.  She presents now for operative management after explanation of risks and benefits.  OPERATIVE FINDINGS: 1. Examination under anesthesia:  Range of motion 170 forward flexion     and external rotation, 15 degrees of abduction.  It is about 45-50     with solid endpoint with no anterior or posterior instability. 2. Diagnostic and operative arthroscopy:     a.     Early glenohumeral degenerative changes, grade 1-2 on both      the humeral head and the glenoid.     b.     Intact rotator cuff from intra-articular viewpoint.     c.     Previously torn and resected and reattached biceps tendon,      which is out of the joint.     d.     Some evidence of mild inflammation within the joint.     e.     Well-fixed superior metallic anchor with minimal impingement      on the overlying capsule.  The metallic anchor, the upper 2-3 mm      of which is visible, is present at the superior aspect of the      glenoid.  DESCRIPTION OF PROCEDURE:  The patient was brought to the operating room where general anesthetic was induced.  Preoperative antibiotics were administered.  Time-out was called.  The patient's head was placed in neutral position in the  beach chair position.  Right shoulder was then prescrubbed with alcohol and Betadine, allowed to air dry, prepped with DuraPrep solution and draped in a sterile manner.  Charlie Pitter was used to cover the operative field.  Posterior portal was created.  Diagnostic arthroscopy was performed.  The patient had some early glenohumeral degenerative changes, both the glenoid and the humerus.  These were grade 1-2 over about 30% of the surface area of both the glenoid and about 20% of the surface area of the humeral head.  The rotator cuff was intact from the articular side.  Mild synovitis was present.  A metallic anchor 3 mm visualized at the superior aspect of the glenoid without impingement on the overlying tissue.  Following examination of the intra- articular glenohumeral joint, the scope was placed in the subacromial space.  It should be noted that the previously repaired glenohumeral ligaments were intact.  In the subacromial space, a lateral portal was created under direct visualization.  Bursectomy was performed.  There was partial thickness about 25%, articular surface tear measuring about 8-9 mm of the supraspinatus.  This was debrided with a shaver.  There was no full-thickness component.  Bursectomy performed and previous subacromial decompression was adequate.  Following this, the instruments were removed.  Portals were closed using 3-0 nylon and a shoulder sling applied.  Waterproof dressing was also applied.  The patient tolerated the procedure well without immediate complications, transferred to the recovery room in stable condition.     Anderson Malta, M.D.   ______________________________ Darnell Level. Alphonzo Severance, M.D.    GSD/MEDQ  D:  11/01/2017  T:  11/02/2017  Job:  [id number]

## 2017-11-03 ENCOUNTER — Other Ambulatory Visit: Payer: Self-pay | Admitting: Orthopedic Surgery

## 2017-11-09 ENCOUNTER — Ambulatory Visit (HOSPITAL_BASED_OUTPATIENT_CLINIC_OR_DEPARTMENT_OTHER): Payer: 59 | Admitting: Certified Registered"

## 2017-11-09 ENCOUNTER — Ambulatory Visit (HOSPITAL_BASED_OUTPATIENT_CLINIC_OR_DEPARTMENT_OTHER)
Admission: RE | Admit: 2017-11-09 | Discharge: 2017-11-09 | Disposition: A | Discharge status: Home / Self Care | Payer: 59 | Source: Ambulatory Visit | Attending: Orthopedic Surgery | Admitting: Orthopedic Surgery

## 2017-11-09 ENCOUNTER — Encounter (HOSPITAL_BASED_OUTPATIENT_CLINIC_OR_DEPARTMENT_OTHER): Payer: Self-pay | Admitting: Emergency Medicine

## 2017-11-09 ENCOUNTER — Encounter (HOSPITAL_BASED_OUTPATIENT_CLINIC_OR_DEPARTMENT_OTHER)
Admission: RE | Disposition: A | Discharge status: Home / Self Care | Payer: Self-pay | Source: Ambulatory Visit | Attending: Orthopedic Surgery

## 2017-11-09 ENCOUNTER — Other Ambulatory Visit: Payer: Self-pay

## 2017-11-09 DIAGNOSIS — K3580 Unspecified acute appendicitis: Secondary | ICD-10-CM | POA: Diagnosis not present

## 2017-11-09 DIAGNOSIS — E669 Obesity, unspecified: Secondary | ICD-10-CM | POA: Insufficient documentation

## 2017-11-09 DIAGNOSIS — S63591A Other specified sprain of right wrist, initial encounter: Secondary | ICD-10-CM | POA: Diagnosis not present

## 2017-11-09 DIAGNOSIS — Z79899 Other long term (current) drug therapy: Secondary | ICD-10-CM | POA: Insufficient documentation

## 2017-11-09 DIAGNOSIS — J309 Allergic rhinitis, unspecified: Secondary | ICD-10-CM | POA: Diagnosis not present

## 2017-11-09 DIAGNOSIS — M25531 Pain in right wrist: Secondary | ICD-10-CM | POA: Diagnosis not present

## 2017-11-09 DIAGNOSIS — Z85820 Personal history of malignant melanoma of skin: Secondary | ICD-10-CM | POA: Insufficient documentation

## 2017-11-09 DIAGNOSIS — Z9049 Acquired absence of other specified parts of digestive tract: Secondary | ICD-10-CM | POA: Diagnosis not present

## 2017-11-09 DIAGNOSIS — Z888 Allergy status to other drugs, medicaments and biological substances status: Secondary | ICD-10-CM | POA: Insufficient documentation

## 2017-11-09 DIAGNOSIS — Z91041 Radiographic dye allergy status: Secondary | ICD-10-CM | POA: Diagnosis not present

## 2017-11-09 DIAGNOSIS — G43909 Migraine, unspecified, not intractable, without status migrainosus: Secondary | ICD-10-CM | POA: Insufficient documentation

## 2017-11-09 DIAGNOSIS — S63511A Sprain of carpal joint of right wrist, initial encounter: Secondary | ICD-10-CM | POA: Diagnosis not present

## 2017-11-09 DIAGNOSIS — Z6834 Body mass index (BMI) 34.0-34.9, adult: Secondary | ICD-10-CM | POA: Insufficient documentation

## 2017-11-09 DIAGNOSIS — W19XXXD Unspecified fall, subsequent encounter: Secondary | ICD-10-CM | POA: Diagnosis not present

## 2017-11-09 DIAGNOSIS — D649 Anemia, unspecified: Secondary | ICD-10-CM | POA: Diagnosis not present

## 2017-11-09 DIAGNOSIS — Z9181 History of falling: Secondary | ICD-10-CM | POA: Diagnosis not present

## 2017-11-09 DIAGNOSIS — Z885 Allergy status to narcotic agent status: Secondary | ICD-10-CM | POA: Insufficient documentation

## 2017-11-09 HISTORY — PX: WRIST ARTHROSCOPY: SHX838

## 2017-11-09 SURGERY — ARTHROSCOPY, WRIST
Anesthesia: General | Site: Wrist | Laterality: Right

## 2017-11-09 MED ORDER — MIDAZOLAM HCL 2 MG/2ML IJ SOLN
1.0000 mg | INTRAMUSCULAR | Status: DC | PRN
  Administered 2017-11-09: 1 mg via INTRAVENOUS

## 2017-11-09 MED ORDER — ONDANSETRON HCL 4 MG/2ML IJ SOLN
INTRAMUSCULAR | Status: DC | PRN
Start: 2017-11-09 — End: 2017-11-09
  Administered 2017-11-09: 4 mg via INTRAVENOUS

## 2017-11-09 MED ORDER — MIDAZOLAM HCL 2 MG/2ML IJ SOLN
INTRAMUSCULAR | Status: AC
  Filled 2017-11-09: qty 2

## 2017-11-09 MED ORDER — CEFAZOLIN SODIUM-DEXTROSE 2-3 GM-%(50ML) IV SOLR
INTRAVENOUS | Status: DC | PRN
  Administered 2017-11-09: 2 g via INTRAVENOUS

## 2017-11-09 MED ORDER — FENTANYL CITRATE (PF) 100 MCG/2ML IJ SOLN
INTRAMUSCULAR | Status: AC
  Filled 2017-11-09: qty 2

## 2017-11-09 MED ORDER — OXYCODONE HCL 5 MG PO TABS
5.0000 mg | ORAL_TABLET | Freq: Once | ORAL | Status: AC
  Administered 2017-11-09: 5 mg via ORAL

## 2017-11-09 MED ORDER — BUPIVACAINE HCL (PF) 0.25 % IJ SOLN
INTRAMUSCULAR | Status: AC
  Filled 2017-11-09: qty 60

## 2017-11-09 MED ORDER — PROMETHAZINE HCL 25 MG/ML IJ SOLN: 6.2500 mg | INTRAMUSCULAR | Status: DC | PRN

## 2017-11-09 MED ORDER — BUPIVACAINE-EPINEPHRINE (PF) 0.25% -1:200000 IJ SOLN
INTRAMUSCULAR | Status: AC
  Filled 2017-11-09: qty 30

## 2017-11-09 MED ORDER — PROPOFOL 10 MG/ML IV BOLUS
INTRAVENOUS | Status: DC | PRN
  Administered 2017-11-09: 200 mg via INTRAVENOUS
  Administered 2017-11-09: 100 mg via INTRAVENOUS

## 2017-11-09 MED ORDER — CEFAZOLIN SODIUM-DEXTROSE 2-4 GM/100ML-% IV SOLN
INTRAVENOUS | Status: AC
  Filled 2017-11-09: qty 100

## 2017-11-09 MED ORDER — PROPOFOL 10 MG/ML IV BOLUS
INTRAVENOUS | Status: AC
  Filled 2017-11-09: qty 40

## 2017-11-09 MED ORDER — ONDANSETRON HCL 4 MG/2ML IJ SOLN
INTRAMUSCULAR | Status: AC
  Filled 2017-11-09: qty 2

## 2017-11-09 MED ORDER — SCOPOLAMINE 1 MG/3DAYS TD PT72
1.0000 | MEDICATED_PATCH | Freq: Once | TRANSDERMAL | Status: DC | PRN
  Administered 2017-11-09: 1.5 mg via TRANSDERMAL

## 2017-11-09 MED ORDER — PHENYLEPHRINE HCL 10 MG/ML IJ SOLN
INTRAMUSCULAR | Status: DC | PRN
  Administered 2017-11-09 (×2): 120 ug via INTRAVENOUS
  Administered 2017-11-09: 80 ug via INTRAVENOUS
  Administered 2017-11-09: 120 ug via INTRAVENOUS
  Administered 2017-11-09: 40 ug via INTRAVENOUS

## 2017-11-09 MED ORDER — CEFAZOLIN SODIUM-DEXTROSE 2-4 GM/100ML-% IV SOLN: 2.0000 g | INTRAVENOUS | Status: DC

## 2017-11-09 MED ORDER — DEXAMETHASONE SODIUM PHOSPHATE 10 MG/ML IJ SOLN
INTRAMUSCULAR | Status: DC | PRN
  Administered 2017-11-09: 10 mg via INTRAVENOUS

## 2017-11-09 MED ORDER — BETAMETHASONE SOD PHOS & ACET 6 (3-3) MG/ML IJ SUSP
INTRAMUSCULAR | Status: AC
  Filled 2017-11-09: qty 1

## 2017-11-09 MED ORDER — PROPOFOL 500 MG/50ML IV EMUL
INTRAVENOUS | Status: DC | PRN
  Administered 2017-11-09: 200 ug/kg/min via INTRAVENOUS

## 2017-11-09 MED ORDER — FENTANYL CITRATE (PF) 100 MCG/2ML IJ SOLN
50.0000 ug | INTRAMUSCULAR | Status: AC | PRN
  Administered 2017-11-09: 25 ug via INTRAVENOUS
  Administered 2017-11-09: 50 ug via INTRAVENOUS
  Administered 2017-11-09: 25 ug via INTRAVENOUS

## 2017-11-09 MED ORDER — BUPIVACAINE HCL (PF) 0.25 % IJ SOLN
INTRAMUSCULAR | Status: DC | PRN
  Administered 2017-11-09: 8 mL

## 2017-11-09 MED ORDER — SCOPOLAMINE 1 MG/3DAYS TD PT72
MEDICATED_PATCH | TRANSDERMAL | Status: AC
  Filled 2017-11-09: qty 1

## 2017-11-09 MED ORDER — OXYCODONE HCL 5 MG PO TABS
ORAL_TABLET | ORAL | Status: AC
  Filled 2017-11-09: qty 1

## 2017-11-09 MED ORDER — LIDOCAINE 2% (20 MG/ML) 5 ML SYRINGE
INTRAMUSCULAR | Status: AC
  Filled 2017-11-09: qty 5

## 2017-11-09 MED ORDER — LACTATED RINGERS IV SOLN
INTRAVENOUS | Status: DC
  Administered 2017-11-09 (×2): via INTRAVENOUS

## 2017-11-09 MED ORDER — DEXAMETHASONE SODIUM PHOSPHATE 10 MG/ML IJ SOLN
INTRAMUSCULAR | Status: AC
  Filled 2017-11-09: qty 1

## 2017-11-09 MED ORDER — BETAMETHASONE SOD PHOS & ACET 6 (3-3) MG/ML IJ SUSP
INTRAMUSCULAR | Status: DC | PRN
  Administered 2017-11-09: 1 mL via INTRA_ARTICULAR

## 2017-11-09 MED ORDER — FENTANYL CITRATE (PF) 100 MCG/2ML IJ SOLN: 25.0000 ug | INTRAMUSCULAR | Status: DC | PRN

## 2017-11-09 MED ORDER — LIDOCAINE HCL (CARDIAC) 20 MG/ML IV SOLN
INTRAVENOUS | Status: DC | PRN
  Administered 2017-11-09: 100 mg via INTRAVENOUS

## 2017-11-09 SURGICAL SUPPLY — 74 items
BAG DECANTER FOR FLEXI CONT (MISCELLANEOUS) IMPLANT
BANDAGE ACE 4X5 VEL STRL LF (GAUZE/BANDAGES/DRESSINGS) ×3 IMPLANT
BLADE CUDA 2.0 (BLADE) IMPLANT
BLADE MINI RND TIP GREEN BEAV (BLADE) IMPLANT
BLADE SURG 15 STRL LF DISP TIS (BLADE) ×1 IMPLANT
BLADE SURG 15 STRL SS (BLADE) ×3
BNDG CMPR 9X4 STRL LF SNTH (GAUZE/BANDAGES/DRESSINGS) ×1
BNDG ESMARK 4X9 LF (GAUZE/BANDAGES/DRESSINGS) ×3 IMPLANT
BNDG GAUZE ELAST 4 BULKY (GAUZE/BANDAGES/DRESSINGS) IMPLANT
BUR CUDA 2.9 (BURR) ×1 IMPLANT
BUR CUDA 2.9MM (BURR) ×1
BUR FULL RADIUS 2.9 (BURR) IMPLANT
BUR FULL RADIUS 2.9MM (BURR)
BUR GATOR 2.9 (BURR) IMPLANT
BUR GATOR 2.9MM (BURR)
BUR SPHERICAL 2.9 (BURR) IMPLANT
BUR SPHERICAL 2.9MM (BURR)
CANISTER SUCT 1200ML W/VALVE (MISCELLANEOUS) IMPLANT
CORD BIPOLAR FORCEPS 12FT (ELECTRODE) IMPLANT
COVER BACK TABLE 60X90IN (DRAPES) ×3 IMPLANT
CUFF TOURNIQUET SINGLE 18IN (TOURNIQUET CUFF) IMPLANT
DECANTER SPIKE VIAL GLASS SM (MISCELLANEOUS) IMPLANT
DRAPE EXTREMITY T 121X128X90 (DRAPE) ×3 IMPLANT
DRAPE SURG 17X23 STRL (DRAPES) ×3 IMPLANT
DURAPREP 26ML APPLICATOR (WOUND CARE) ×4 IMPLANT
GAUZE SPONGE 4X4 12PLY STRL (GAUZE/BANDAGES/DRESSINGS) ×3 IMPLANT
GAUZE SPONGE 4X4 16PLY XRAY LF (GAUZE/BANDAGES/DRESSINGS) IMPLANT
GAUZE XEROFORM 1X8 LF (GAUZE/BANDAGES/DRESSINGS) ×2 IMPLANT
GLOVE BIO SURGEON STRL SZ 6.5 (GLOVE) ×1 IMPLANT
GLOVE BIO SURGEONS STRL SZ 6.5 (GLOVE) ×1
GLOVE BIOGEL PI IND STRL 7.0 (GLOVE) IMPLANT
GLOVE BIOGEL PI INDICATOR 7.0 (GLOVE) ×4
GLOVE SURG SYN 8.0 (GLOVE) ×6 IMPLANT
GLOVE SURG SYN 8.0 PF PI (GLOVE) ×2 IMPLANT
GOWN STRL REUS W/ TWL LRG LVL3 (GOWN DISPOSABLE) ×1 IMPLANT
GOWN STRL REUS W/TWL LRG LVL3 (GOWN DISPOSABLE) ×3
GOWN STRL REUS W/TWL XL LVL3 (GOWN DISPOSABLE) ×6 IMPLANT
IV NS IRRIG 3000ML ARTHROMATIC (IV SOLUTION) ×3 IMPLANT
IV SET EXT 30 76VOL 4 MALE LL (IV SETS) ×3 IMPLANT
NDL EPIDURAL TUOHY 20GX3.5 (NEEDLE) IMPLANT
NDL SAFETY ECLIPSE 18X1.5 (NEEDLE) ×2 IMPLANT
NDL SPNL 25GX3.5 QUINCKE BL (NEEDLE) IMPLANT
NEEDLE HYPO 18GX1.5 SHARP (NEEDLE) ×6
NEEDLE HYPO 22GX1.5 SAFETY (NEEDLE) ×3 IMPLANT
NEEDLE SPNL 25GX3.5 QUINCKE BL (NEEDLE) IMPLANT
NEEDLE TUOHY 20GX3.5 (NEEDLE) IMPLANT
NS IRRIG 1000ML POUR BTL (IV SOLUTION) IMPLANT
PACK BASIN DAY SURGERY FS (CUSTOM PROCEDURE TRAY) ×3 IMPLANT
PAD CAST 4YDX4 CTTN HI CHSV (CAST SUPPLIES) ×1 IMPLANT
PADDING CAST ABS 4INX4YD NS (CAST SUPPLIES) ×2
PADDING CAST ABS COTTON 4X4 ST (CAST SUPPLIES) ×1 IMPLANT
PADDING CAST COTTON 4X4 STRL (CAST SUPPLIES) ×3
PROBE BIPOLAR ARTHRO 85MM 30D (MISCELLANEOUS) IMPLANT
SET SM JOINT TUBING/CANN (CANNULA) ×3 IMPLANT
SHEET MEDIUM DRAPE 40X70 STRL (DRAPES) ×3 IMPLANT
SLING ARM XL FOAM STRAP (SOFTGOODS) IMPLANT
SPLINT PLASTER CAST XFAST 3X15 (CAST SUPPLIES) IMPLANT
SPLINT PLASTER CAST XFAST 4X15 (CAST SUPPLIES) ×5 IMPLANT
SPLINT PLASTER XTRA FAST SET 4 (CAST SUPPLIES) ×10
SPLINT PLASTER XTRA FASTSET 3X (CAST SUPPLIES)
STOCKINETTE 4X48 STRL (DRAPES) ×3 IMPLANT
SUT ETHILON 4 0 PS 2 18 (SUTURE) IMPLANT
SUT PDS AB 2-0 CT2 27 (SUTURE) IMPLANT
SUT PROLENE 3 0 PS 2 (SUTURE) IMPLANT
SUT VICRYL RAPIDE 4-0 (SUTURE) IMPLANT
SUT VICRYL RAPIDE 4/0 PS 2 (SUTURE) IMPLANT
SYR 10ML LL (SYRINGE) ×3 IMPLANT
TOWEL OR 17X24 6PK STRL BLUE (TOWEL DISPOSABLE) ×3 IMPLANT
TRAP DIGIT (INSTRUMENTS) ×2 IMPLANT
TRAP FINGER LRG (INSTRUMENTS) IMPLANT
TUBE CONNECTING 20'X1/4 (TUBING) ×1
TUBE CONNECTING 20X1/4 (TUBING) ×2 IMPLANT
UNDERPAD 30X30 (UNDERPADS AND DIAPERS) ×3 IMPLANT
WATER STERILE IRR 1000ML POUR (IV SOLUTION) ×3 IMPLANT

## 2017-11-09 NOTE — Discharge Instructions (Signed)

## 2017-11-09 NOTE — H&P (Signed)
Emma Mathews is an 41 y.o. female.   Chief Complaint: Right wrist pain HPI: Patient's a pleasant 41 year old right-hand-dominant female status post right wrist trauma after a fall approximately 3 months ago with persistent swelling and MRI documented tearing of the TFCC and ligamentous structures.  Past Medical History:  Diagnosis Date  . Cancer ([place])    melanoma excision in 2013  . Complication of anesthesia    slow to wake up  . Headache    migraine  . History of blood transfusion   . Pneumonia 2010  . PONV (postoperative nausea and vomiting)   . Seasonal allergies     Past Surgical History:  Procedure Laterality Date  . APPENDECTOMY    . CHOLECYSTECTOMY    . EYE SURGERY Bilateral    PRK  . Labrum Repair Right 2000   Open  . LAPAROSCOPIC APPENDECTOMY N/A 09/02/2013   Procedure: APPENDECTOMY LAPAROSCOPIC;  Surgeon: Harl Bowie, MD;  Location: [facility];  Service: General;  Laterality: N/A;  . SHOULDER ARTHROSCOPY Left 2005   bone spurs   . SHOULDER ARTHROSCOPY Right 11/01/2017   Procedure: RIGHT SHOULDER ARTHROSCOPY WITH SUBACROMIAL DECOMPRESSION;  Surgeon: Meredith Pel, MD;  Location: [facility];  Service: Orthopedics;  Laterality: Right;  . SHOULDER ARTHROSCOPY WITH SUBACROMIAL DECOMPRESSION, ROTATOR CUFF REPAIR AND BICEP TENDON REPAIR Right 02/24/2016   Procedure: RIGHT SHOULDER ARTHROSCOPY WITH DEBRIDEMENT, BICEPS TENODESIS;  Surgeon: Meredith Pel, MD;  Location: [facility];  Service: Orthopedics;  Laterality: Right;  . SHOULDER SURGERY Right 2007   Revision and repair flap  . WISDOM TOOTH EXTRACTION      History reviewed. No pertinent family history. Social History:  reports that  has never smoked. she has never used smokeless tobacco. She reports that she drinks alcohol. She reports that she does not use drugs.  Allergies:  Allergies  Allergen Reactions  . Contrast Media [Iodinated Diagnostic Agents] Shortness Of Breath, Nausea And Vomiting and Other  (See Comments)    Shortness of breath  . Diphenhydramine Hcl Hives and Other (See Comments)    Pt states she can take Dye free Benadryl  . Chlorhexidine Gluconate Hives  . Codeine Hives, Nausea And Vomiting and Rash    Medications Prior to Admission  Medication Sig Dispense Refill  . levonorgestrel (MIRENA) 20 MCG/24HR IUD 1 each by Intrauterine route once.    . mometasone (NASONEX) 50 MCG/ACT nasal spray Place 2 sprays into the nose daily.    . Multiple Vitamin (MULTIVITAMIN WITH MINERALS) TABS tablet Take 1 tablet by mouth at bedtime.    . naproxen sodium (ALEVE) 220 MG tablet Take 220-440 mg by mouth 2 (two) times daily as needed (for pain.).    Marland Kitchen oxycodone (OXY-IR) 5 MG capsule Take 5 mg by mouth every 4 (four) hours as needed.    . promethazine (PHENERGAN) 25 MG tablet Take 25 mg by mouth every 6 (six) hours as needed for nausea or vomiting.      No results found for this or any previous visit (from the past 48 hour(s)). No results found.  Review of Systems  All other systems reviewed and are negative.   Blood pressure (!) 103/45, pulse 72, temperature 97.9 F (36.6 C), temperature source Oral, resp. rate 18, height 5\' 6"  (1.676 m), weight 97.8 kg (215 lb 9.6 oz), SpO2 100 %. Physical Exam  Constitutional: She is oriented to person, place, and time. She appears well-developed and well-nourished.  HENT:  Head: Normocephalic and atraumatic.  Neck:  Normal range of motion.  Cardiovascular: Normal rate.  Respiratory: Effort normal.  Musculoskeletal:       Right wrist: She exhibits tenderness and bony tenderness.  Right wrist pain along the ulnar side made worse with pronation and supination as well as ulnar deviation  Neurological: She is alert and oriented to person, place, and time.  Skin: Skin is warm.  Psychiatric: She has a normal mood and affect. Her behavior is normal. Judgment and thought content normal.     Assessment/Plan Patient's a very pleasant 41 year old  right-hand-dominant female status post right wrist trauma with MRI documented internal derangement and failed conservative care. Patient wishes to proceed with right wrist arthroscopy with debridement as necessary. Patient understands the risks and benefits and wishes to proceed.  Schuyler Amor, MD 11/09/2017, 8:16 AM

## 2017-11-09 NOTE — Anesthesia Procedure Notes (Signed)
Procedure Name: LMA Insertion Performed by: Verita Lamb, CRNA Pre-anesthesia Checklist: Patient identified, Emergency Drugs available, Suction available, Patient being monitored and Timeout performed Preoxygenation: Pre-oxygenation with 100% oxygen Induction Type: IV induction LMA: LMA inserted LMA Size: 4.0 Tube type: Oral Number of attempts: 1 Placement Confirmation: positive ETCO2,  CO2 detector and breath sounds checked- equal and bilateral Tube secured with: Tape Dental Injury: Teeth and Oropharynx as per pre-operative assessment

## 2017-11-09 NOTE — Anesthesia Postprocedure Evaluation (Signed)
Anesthesia Post Note  Patient: Emma Mathews  Procedure(s) Performed: ARTHROSCOPY WRIST WITH DEBRIDEMENT (Right Wrist)     Patient location during evaluation: PACU Anesthesia Type: General Level of consciousness: awake and alert Pain management: pain level controlled Vital Signs Assessment: post-procedure vital signs reviewed and stable Respiratory status: spontaneous breathing, nonlabored ventilation and respiratory function stable Cardiovascular status: blood pressure returned to baseline and stable Postop Assessment: no apparent nausea or vomiting Anesthetic complications: no    Last Vitals:  Vitals:   11/09/17 0930 11/09/17 0945  BP: (!) 87/40 (!) 94/52  Pulse: 72 82  Resp: 15 13  Temp: 36.4 C   SpO2: 99% 100%    Last Pain:  Vitals:   11/09/17 1000  TempSrc:   PainSc: 4                  Catalina Gravel

## 2017-11-09 NOTE — Op Note (Signed)
Please see dictated report [phone]

## 2017-11-09 NOTE — Anesthesia Preprocedure Evaluation (Signed)
Anesthesia Evaluation  Patient identified by MRN, date of birth, ID band Patient awake    Reviewed: Allergy & Precautions, NPO status , Patient's Chart, lab work & pertinent test results  History of Anesthesia Complications (+) PONV and history of anesthetic complications  Airway Mallampati: I  TM Distance: >3 FB Neck ROM: Full    Dental  (+) Teeth Intact, Dental Advisory Given   Pulmonary neg pulmonary ROS,    Pulmonary exam normal breath sounds clear to auscultation       Cardiovascular negative cardio ROS Normal cardiovascular exam Rhythm:Regular Rate:Normal     Neuro/Psych  Headaches,    GI/Hepatic negative GI ROS, Neg liver ROS,   Endo/Other  Obesity   Renal/GU negative Renal ROS     Musculoskeletal negative musculoskeletal ROS (+)   Abdominal   Peds  Hematology negative hematology ROS (+)   Anesthesia Other Findings Day of surgery medications reviewed with the patient.  Reproductive/Obstetrics negative OB ROS                             Anesthesia Physical Anesthesia Plan  ASA: II  Anesthesia Plan: General   Post-op Pain Management:    Induction: Intravenous  PONV Risk Score and Plan: 4 or greater and Scopolamine patch - Pre-op, Midazolam, Propofol infusion, TIVA, Ondansetron and Dexamethasone  Airway Management Planned: LMA  Additional Equipment:   Intra-op Plan:   Post-operative Plan: Extubation in OR  Informed Consent: I have reviewed the patients History and Physical, chart, labs and discussed the procedure including the risks, benefits and alternatives for the proposed anesthesia with the patient or authorized representative who has indicated his/her understanding and acceptance.   Dental advisory given  Plan Discussed with: CRNA  Anesthesia Plan Comments: (Risks/benefits of general anesthesia discussed with patient including risk of damage to teeth, lips,  gum, and tongue, nausea/vomiting, allergic reactions to medications, and the possibility of heart attack, stroke and death.  All patient questions answered.  Patient wishes to proceed.)        Anesthesia Quick Evaluation

## 2017-11-09 NOTE — Transfer of Care (Signed)
Immediate Anesthesia Transfer of Care Note  Patient: ICHELLE HARRAL  Procedure(s) Performed: ARTHROSCOPY WRIST WITH DEBRIDEMENT (Right Wrist)  Patient Location: PACU  Anesthesia Type:General  Level of Consciousness: awake, sedated and patient cooperative  Airway & Oxygen Therapy: Patient Spontanous Breathing and Patient connected to face mask oxygen  Post-op Assessment: Report given to RN and Post -op Vital signs reviewed and stable  Post vital signs: Reviewed and stable  Last Vitals:  Vitals:   11/09/17 0722  BP: (!) 103/45  Pulse: 72  Resp: 18  Temp: 36.6 C  SpO2: 100%    Last Pain:  Vitals:   11/09/17 0722  TempSrc: Oral  PainSc: 0-No pain         Complications: No apparent anesthesia complications

## 2017-11-09 NOTE — Op Note (Signed)
NAMEDARBIE, BIANCARDI NO.:  [id number]  MEDICAL RECORD NO.:  [id number]  LOCATION:                                 FACILITY:  PHYSICIAN:  Sheral Apley. Burney Gauze, M.D.   DATE OF BIRTH:  DATE OF PROCEDURE:  11/09/2017 DATE OF DISCHARGE:                              OPERATIVE REPORT   PREOPERATIVE DIAGNOSIS:  Chronic right wrist pain, status post fall 3 months ago.  POSTOPERATIVE DIAGNOSIS:  Chronic right wrist pain, status post fall 3 months ago.  PROCEDURE:  Right wrist arthroscopy with debridement of lunotriquetral interosseous ligament tear and partial fraying of TFCC, right wrist.  SURGEON:  Sheral Apley. Burney Gauze, M.D.  ASSISTANT:  None.  ANESTHESIA:  General.  COMPLICATION:  None.  DRAINS:  None.  DESCRIPTION OF PROCEDURE:  The patient was taken to the operating suite after induction of adequate general anesthetic, right upper extremity was prepped and draped in sterile fashion.  An Esmarch was used to exsanguinate the limb.  Tourniquet was then inflated to 250 mmHg.  At this point in time, the right upper extremity was padded and placed in the wrist traction tower with 15 pounds of counter traction across the radiocarpal joint.  Standard 3-4 arthroscopic portal was established 1 cm distal to Lister's tubercle.  The skin was incised sharply.  Blunt dissection was carried down through the skin and subcutaneous tissues. The joint was entered.  The scope was introduced into the joint. Visualization revealed intact radial side ligaments and what appeared to be some fraying of the TFCC and a possible LT ligament tear.  An 66- gauge needle was used to establish a 6U outflow portal, followed by 4-5 working portal under direct vision through the 4-5 working portal using a 2.0 suction shaver and 2.3 mm ArthroCare wand.  We debrided the fraying of the TFCC centrally and LT ligament tear grade 1 using both these instruments.  We then placed the scope in the 4-5  portal and directed it dorsally and radially.  The scapholunate interosseous ligament was intact.  The radial side ligaments were intact.  At the end of procedure, we removed the instruments from the portals.  We injected 1 mL of Celestone intra-articularly and Marcaine 0.25% plain around the portals.  The portals were closed with 3- 0 Prolene.  Xeroform, 4x4s, fluffs, and a volar splint were applied. The patient tolerated this procedure well and went to recovery room in stable fashion.     Sheral Apley Burney Gauze, M.D.   ______________________________ Sheral Apley. Burney Gauze, M.D.    MAW/MEDQ  D:  11/09/2017  T:  11/09/2017  Job:  [id number]

## 2017-11-10 ENCOUNTER — Encounter (HOSPITAL_BASED_OUTPATIENT_CLINIC_OR_DEPARTMENT_OTHER): Payer: Self-pay | Admitting: Orthopedic Surgery

## 2017-11-16 DIAGNOSIS — M24131 Other articular cartilage disorders, right wrist: Secondary | ICD-10-CM | POA: Diagnosis not present

## 2017-12-28 DIAGNOSIS — Z23 Encounter for immunization: Secondary | ICD-10-CM | POA: Diagnosis not present

## 2018-01-07 ENCOUNTER — Ambulatory Visit: Payer: 59 | Admitting: Physician Assistant

## 2018-02-01 ENCOUNTER — Ambulatory Visit (INDEPENDENT_AMBULATORY_CARE_PROVIDER_SITE_OTHER): Payer: 59 | Admitting: Physician Assistant

## 2018-02-01 ENCOUNTER — Encounter: Payer: Self-pay | Admitting: Physician Assistant

## 2018-02-01 ENCOUNTER — Other Ambulatory Visit: Payer: Self-pay

## 2018-02-01 VITALS — BP 110/82 | HR 88 | Temp 98.1°F | Resp 16 | Ht 66.0 in | Wt 199.8 lb

## 2018-02-01 DIAGNOSIS — Z1389 Encounter for screening for other disorder: Secondary | ICD-10-CM | POA: Diagnosis not present

## 2018-02-01 DIAGNOSIS — Z13220 Encounter for screening for lipoid disorders: Secondary | ICD-10-CM | POA: Diagnosis not present

## 2018-02-01 DIAGNOSIS — T753XXA Motion sickness, initial encounter: Secondary | ICD-10-CM | POA: Diagnosis not present

## 2018-02-01 DIAGNOSIS — Z13228 Encounter for screening for other metabolic disorders: Secondary | ICD-10-CM

## 2018-02-01 DIAGNOSIS — Z1329 Encounter for screening for other suspected endocrine disorder: Secondary | ICD-10-CM | POA: Diagnosis not present

## 2018-02-01 DIAGNOSIS — Z0000 Encounter for general adult medical examination without abnormal findings: Secondary | ICD-10-CM

## 2018-02-01 DIAGNOSIS — R928 Other abnormal and inconclusive findings on diagnostic imaging of breast: Secondary | ICD-10-CM | POA: Diagnosis not present

## 2018-02-01 DIAGNOSIS — Z8669 Personal history of other diseases of the nervous system and sense organs: Secondary | ICD-10-CM

## 2018-02-01 DIAGNOSIS — D649 Anemia, unspecified: Secondary | ICD-10-CM | POA: Diagnosis not present

## 2018-02-01 DIAGNOSIS — R51 Headache: Secondary | ICD-10-CM | POA: Diagnosis not present

## 2018-02-01 DIAGNOSIS — E669 Obesity, unspecified: Secondary | ICD-10-CM | POA: Insufficient documentation

## 2018-02-01 DIAGNOSIS — R519 Headache, unspecified: Secondary | ICD-10-CM

## 2018-02-01 MED ORDER — PROMETHAZINE HCL 25 MG PO TABS: 25.0000 mg | ORAL_TABLET | Freq: Three times a day (TID) | ORAL | 1 refills | Status: DC | PRN

## 2018-02-01 MED ORDER — SCOPOLAMINE 1 MG/3DAYS TD PT72: 1.0000 | MEDICATED_PATCH | TRANSDERMAL | 12 refills | Status: DC

## 2018-02-01 NOTE — Progress Notes (Signed)
Subjective:    Patient ID: Emma Mathews, female    DOB: September 07, 1976, 42 y.o.   MRN: [id number]   Chief Complaint  Patient presents with  . Annual Exam   HPI  Cervical and Breast Cancer Screening: 06/18/2017. Patient had her pap smear done at her gynecologist, [facility].  A needle biopsy was found of cyst that was noted on 06/18/2017 on her left breast. A work up was done at the [facility] and cyst was found to be benign.   HIV and STI screening: 12/31/2017 Seasonal Influenza Vaccination: 08/06/2017 Tdap and Pneomococcal vaccinations: 12/31/2017  Last dental exam: "Four months ago, in November. Scheduled for next appointment in May." Last eye exam: 05/09/2017  Patient has lost 17 lbs between 11/09/2017 and 02/01/2018. Patient has accomplished this by walking 4-5 days a week for 45 minutes to an hour and "watching what she eats." Patient's diet consists of greek yoghurt, egg whites, tuna, vegetables, no salad dressing, and chicken or fish. Patient sleeps "amazingly" and states that she gets at least 8 hours of sleep a night.  Patient is going to [place] with her family on a mission trip on March 03, 2018. Patient needs something for car sickness when traveling. Patient has managed her nausea and motion sickness with Phenergan and Scopolamine patches in the past.  Patient had left wrist and shoulder surgery on 11/01/2017 and had labs done at that time.  Patient Active Problem List   Diagnosis Date Noted  . Obesity 02/01/2018  . Headache 02/01/2018  . Motion sickness 02/01/2018  . 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 ADD 10/28/2009  . ANEMIA-UNSPECIFIED 04/28/2009  . ALLERGIC RHINITIS 02/25/2009   Allergies  Allergen Reactions  . Contrast Media [Iodinated Diagnostic Agents] Shortness Of Breath, Nausea And Vomiting and Other (See Comments)    Shortness of breath  . Diphenhydramine Hcl Hives and Other (See Comments)    Pt states she can  take Dye free Benadryl  . Chlorhexidine Gluconate Hives  . Codeine Hives, Nausea And Vomiting and Rash   Prior to Admission medications   Medication Sig Start Date End Date Taking? Authorizing Provider  levonorgestrel (MIRENA) 20 MCG/24HR IUD 1 each by Intrauterine route once.    [provider]  mometasone (NASONEX) 50 MCG/ACT nasal spray Place 2 sprays into the nose daily.    [provider]  Multiple Vitamin (MULTIVITAMIN WITH MINERALS) TABS tablet Take 1 tablet by mouth at bedtime.    [provider]  naproxen sodium (ALEVE) 220 MG tablet Take 220-440 mg by mouth 2 (two) times daily as needed (for pain.).    [provider]  oxycodone (OXY-IR) 5 MG capsule Take 5 mg by mouth every 4 (four) hours as needed.    [provider]  promethazine (PHENERGAN) 25 MG tablet Take 25 mg by mouth every 6 (six) hours as needed for nausea or vomiting.    [provider]    Past Medical History:  Diagnosis Date  . Appendicitis, acute 10/02/2013  . Cancer ([place])    melanoma excision in 2013  . Cholecystitis   . Complication of anesthesia    slow to wake up  . Headache    migraine  . Mammographic breast lesion 06/28/2017   S/p biopsy, benign   . Pneumonia 2010  . PONV (postoperative nausea and vomiting)   . Seasonal allergies   . STRESS FRACTURE, FOOT 04/08/2009   Qualifier: Diagnosis of  By: Arnoldo Morale MD, Jenny Reichmann  E    Social History   Socioeconomic History  . Marital status: Married    Spouse name: Marjory Lies  . Number of children: 1  . Years of education: Not on file  . Highest education level: Not on file  Social Needs  . Financial resource strain: Not on file  . Food insecurity - worry: Not on file  . Food insecurity - inability: Not on file  . Transportation needs - medical: Not on file  . Transportation needs - non-medical: Not on file  Occupational History  . Not on file  Tobacco Use  . Smoking status: Never Smoker  . Smokeless  tobacco: Never Used  Substance and Sexual Activity  . Alcohol use: Yes    Comment: ocassional - social 1- 2 drinks  . Drug use: No  . Sexual activity: Yes    Birth control/protection: IUD  Other Topics Concern  . Not on file  Social History Narrative   Patient lives with husband and one child, a son named Max.    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    Family History  Adopted: Yes  Family history unknown: Yes     Past Surgical History:  Procedure Laterality Date  . APPENDECTOMY    . CHOLECYSTECTOMY    . EYE SURGERY Bilateral    PRK  . Labrum Repair Right 2000   Open  . LAPAROSCOPIC APPENDECTOMY N/A 09/02/2013   Procedure: APPENDECTOMY LAPAROSCOPIC;  Surgeon: Harl Bowie, MD;  Location: [facility];  Service: General;  Laterality: N/A;  . SHOULDER ARTHROSCOPY Left 2005   bone spurs   . SHOULDER ARTHROSCOPY Right 11/01/2017   Procedure: RIGHT SHOULDER ARTHROSCOPY WITH SUBACROMIAL DECOMPRESSION;  Surgeon: Meredith Pel, MD;  Location: [facility];  Service: Orthopedics;  Laterality: Right;  . SHOULDER ARTHROSCOPY WITH SUBACROMIAL DECOMPRESSION, ROTATOR CUFF REPAIR AND BICEP TENDON REPAIR Right 02/24/2016   Procedure: RIGHT SHOULDER ARTHROSCOPY WITH DEBRIDEMENT, BICEPS TENODESIS;  Surgeon: Meredith Pel, MD;  Location: [facility];  Service: Orthopedics;  Laterality: Right;  . SHOULDER SURGERY Right 2007   Revision and repair flap  . WISDOM TOOTH EXTRACTION    . WRIST ARTHROSCOPY Right 11/09/2017   Procedure: ARTHROSCOPY WRIST WITH DEBRIDEMENT;  Surgeon: Charlotte Crumb, MD;  Location: [facility];  Service: Orthopedics;  Laterality: Right;    Review of Systems  Constitutional: Negative.  Negative for activity change, appetite change, diaphoresis, fatigue and unexpected weight change.  HENT: Positive for sinus pressure. Negative for congestion, ear pain, postnasal drip, sinus pain, sneezing, sore throat and tinnitus.   Eyes:  Positive for photophobia. Negative for pain, discharge and visual disturbance.  Respiratory: Negative.  Negative for apnea, cough, chest tightness, shortness of breath and wheezing.   Cardiovascular: Negative.  Negative for chest pain and palpitations.  Gastrointestinal: Positive for nausea and vomiting (With headache. ). Negative for abdominal pain, anal bleeding, blood in stool, constipation and diarrhea.  Endocrine: Negative.  Negative for polydipsia and polyuria.  Genitourinary: Negative.  Negative for difficulty urinating, dysuria, frequency, hematuria, menstrual problem and urgency.  Musculoskeletal: Negative.  Negative for arthralgias, back pain, myalgias, neck pain and neck stiffness.  Skin: Negative.  Negative for color change and rash.  Allergic/Immunologic: Positive for environmental allergies and food allergies (Cannot touch raw shrimp.).  Neurological: Positive for headaches (More headaches in last 4-5 months. Accompanied by nausea/vomiting, photophobia. ). Negative for dizziness, light-headedness and numbness.  Psychiatric/Behavioral: Negative.  Negative for dysphoric mood and  sleep disturbance. The patient is not nervous/anxious.        Objective:   Physical Exam  Constitutional: She is oriented to person, place, and time. She appears well-developed and well-nourished.  BP 110/82   Pulse 88   Temp 98.1 F (36.7 C)   Resp 16   Ht 5\' 6"  (1.676 m)   Wt 199 lb 12.8 oz (90.6 kg)   SpO2 97%   BMI 32.25 kg/m   HENT:  Head: Normocephalic and atraumatic.  Right Ear: External ear normal.  Left Ear: External ear normal.  Nose: Nose normal.  Mouth/Throat: Oropharynx is clear and moist.  Sinus pressure replicated upon palpation.  Eyes: Conjunctivae and EOM are normal. Pupils are equal, round, and reactive to light.  Neck: Normal range of motion. Neck supple.  Cardiovascular: Normal rate, regular rhythm, normal heart sounds and intact distal pulses. Exam reveals no gallop and no  friction rub.  No murmur heard. Pulmonary/Chest: Effort normal and breath sounds normal. She has no wheezes. She has no rales.  Abdominal: Soft. Bowel sounds are normal. She exhibits no distension. There is no tenderness.  Musculoskeletal: Normal range of motion. She exhibits no edema, tenderness or deformity.  Lymphadenopathy:    She has no cervical adenopathy.  Neurological: She is alert and oriented to person, place, and time. She has normal reflexes.  Skin: Skin is warm and dry. No rash noted. No erythema. No pallor.  Psychiatric: She has a normal mood and affect. Her behavior is normal. Judgment and thought content normal.   Depression screen [facility] 2/9 02/01/2018 05/10/2017 01/25/2017 11/16/2016  Decreased Interest 0 0 0 0  Down, Depressed, Hopeless 0 0 0 0  PHQ - 2 Score 0 0 0 0   Wt Readings from Last 3 Encounters:  02/01/18 199 lb 12.8 oz (90.6 kg)  11/09/17 215 lb 9.6 oz (97.8 kg)  11/01/17 198 lb (89.8 kg)      Assessment & Plan:   1. Annual physical exam Patient's physical exam unremarkable for individual of her age and activity level.  2. Anemia, unspecified type CBC last done on 11/01/2017. On 11/01/17, RBC was 5.27 MIL/uL and hemoglobin was 15.4 g/dL. All other CBC values were within normal ranges.  - CBC with Differential/Platelet  3. Screening for hyperlipidemia Lipid panel done at this visit.  - Lipid panel  4. Screening for metabolic disorder BMP last done on 11/01/2017 and all values were within normal ranges. - Comprehensive metabolic panel  5. Screening for thyroid disorder Patient has no symptoms of thyroid disorder at this time.  - TSH  6. Screening for blood or protein in urine Patient's last UA was done on 12/11/2016 and was normal. UA was updated at this visit.  - Urinalysis, dipstick only  7. Mammographic breast lesion  A needle biopsy was done on lesion at [facility] and lesion was found to be benign. Breast cancer screening being  followed by patient's gynecologist.   8.Nonintractable headache, unspecified chronicity pattern, unspecified headache type Patient has had an increase in headache frequency over the last 4-5 months. Patient cannot identify triggers and does not take any abortive agents. A referral to Neurology was made.  9. Motion sickness, initial encounter Phenergan and a scopolamine patch were prescribed.  10. History of migraine  Patient previously took Maxalt for migraine, but stopped in 2002 when she conceived her son Max.   Return in about 1 year (around 02/02/2019) for Annual exam..

## 2018-02-01 NOTE — Progress Notes (Signed)
Patient ID: Emma Mathews, female    DOB: [birth year], 42 y.o.   MRN: [id number]  PCP: Harrison Mons, PA-C  Chief Complaint  Patient presents with  . Annual Exam    Subjective:   Presents for [employer].   Cervical Cancer Screening: 06/18/2017 Breast Cancer Screening: 06/18/2017 Colorectal Cancer Screening: not yet a candidate Bone Density Testing: not yet a candidate HIV Screening: 12/31/17 STI Screening: 12/31/17 Seasonal Influenza Vaccination: 08/06/17 Td/Tdap Vaccination: 12/31/17 Pneumococcal Vaccination: 12/31/17  Zoster Vaccination: not yet a candidate Frequency of Dental evaluation: Q6 months, next visit is 03/2018 Frequency of Eye evaluation: annually, last visit 05/09/17   Review of Systems Constitutional: Negative. Negative for activity change, appetite change, diaphoresis, fatigue and unexpected weight change.  HENT: Positive for sinus pressure. Negative for congestion, ear pain, postnasal drip, sinus pain, sneezing, sore throat and tinnitus.  Eyes: Positive for photophobia. Negative for pain, discharge and visual disturbance.  Respiratory: Negative. Negative for apnea, cough, chest tightness, shortness of breath and wheezing.  Cardiovascular: Negative. Negative for chest pain and palpitations.  Gastrointestinal: Positive for nausea and vomiting (With headache. ). Negative for abdominal pain, anal bleeding, blood in stool, constipation and diarrhea.  Endocrine: Negative. Negative for polydipsia and polyuria.  Genitourinary: Negative. Negative for difficulty urinating, dysuria, frequency, hematuria, menstrual problem and urgency.  Musculoskeletal: Negative. Negative for arthralgias, back pain, myalgias, neck pain and neck stiffness.  Skin: Negative. Negative for color change and rash.  Allergic/Immunologic: Positive for environmental allergies and food allergies (Cannot touch raw shrimp.).  Neurological: Positive for headaches (More headaches in  last 4-5 months. Accompanied by nausea/vomiting, photophobia. ). Negative for dizziness, light-headedness and numbness.  Psychiatric/Behavioral: Negative. Negative for dysphoric mood and sleep disturbance. The patient is not nervous/anxious.    Patient Active Problem List   Diagnosis Date Noted  . Obesity 02/01/2018  . 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 ADD 10/28/2009  . ANEMIA-UNSPECIFIED 04/28/2009  . ALLERGIC RHINITIS 02/25/2009    Past Medical History:  Diagnosis Date  . Appendicitis, acute 10/02/2013  . Cancer ([place])    melanoma excision in 2013  . Cholecystitis   . Complication of anesthesia    slow to wake up  . Headache    migraine  . Mammographic breast lesion 06/28/2017   S/p biopsy, benign   . Pneumonia 2010  . PONV (postoperative nausea and vomiting)   . Seasonal allergies   . STRESS FRACTURE, FOOT 04/08/2009   Qualifier: Diagnosis of  By: Arnoldo Morale MD, Balinda Quails      Prior to Admission medications   Medication Sig Start Date End Date Taking? Authorizing Provider  Multiple Vitamin (MULTIVITAMIN WITH MINERALS) TABS tablet Take 1 tablet by mouth at bedtime.   Yes [provider]  levonorgestrel (MIRENA, 52 MG,) 20 MCG/24HR IUD Mirena 20 mcg/24 hours (5 yrs) 52 mg intrauterine device    [provider]  mometasone (NASONEX) 50 MCG/ACT nasal spray Place into the nose.    [provider]  naproxen sodium (ALEVE) 220 MG tablet Take by mouth.    [provider]    Allergies  Allergen Reactions  . Contrast Media [Iodinated Diagnostic Agents] Shortness Of Breath, Nausea And Vomiting and Other (See Comments)    Shortness of breath  . Diphenhydramine Hcl Hives and Other (See Comments)    Pt states she can take Dye free Benadryl  . Chlorhexidine Gluconate Hives  . Codeine Hives, Nausea And Vomiting and Rash    Past Surgical  History:  Procedure Laterality Date  . APPENDECTOMY    . CHOLECYSTECTOMY    . EYE SURGERY Bilateral     PRK  . Labrum Repair Right 2000   Open  . LAPAROSCOPIC APPENDECTOMY N/A 09/02/2013   Procedure: APPENDECTOMY LAPAROSCOPIC;  Surgeon: Harl Bowie, MD;  Location: [place];  Service: General;  Laterality: N/A;  . SHOULDER ARTHROSCOPY Left 2005   bone spurs   . SHOULDER ARTHROSCOPY Right 11/01/2017   Procedure: RIGHT SHOULDER ARTHROSCOPY WITH SUBACROMIAL DECOMPRESSION;  Surgeon: Meredith Pel, MD;  Location: [place];  Service: Orthopedics;  Laterality: Right;  . SHOULDER ARTHROSCOPY WITH SUBACROMIAL DECOMPRESSION, ROTATOR CUFF REPAIR AND BICEP TENDON REPAIR Right 02/24/2016   Procedure: RIGHT SHOULDER ARTHROSCOPY WITH DEBRIDEMENT, BICEPS TENODESIS;  Surgeon: Meredith Pel, MD;  Location: [place];  Service: Orthopedics;  Laterality: Right;  . SHOULDER SURGERY Right 2007   Revision and repair flap  . WISDOM TOOTH EXTRACTION    . WRIST ARTHROSCOPY Right 11/09/2017   Procedure: ARTHROSCOPY WRIST WITH DEBRIDEMENT;  Surgeon: Charlotte Crumb, MD;  Location: [place];  Service: Orthopedics;  Laterality: Right;    Family History  Adopted: Yes  Family history unknown: Yes    Social History   Socioeconomic History  . Marital status: Married    Spouse name: Marjory Lies  . Number of children: 1  . Years of education: None  . Highest education level: None  Social Needs  . Financial resource strain: None  . Food insecurity - worry: None  . Food insecurity - inability: None  . Transportation needs - medical: None  . Transportation needs - non-medical: None  Occupational History  . None  Tobacco Use  . Smoking status: Never Smoker  . Smokeless tobacco: Never Used  Substance and Sexual Activity  . Alcohol use: Yes    Comment: ocassional - social 1- 2 drinks  . Drug use: No  . Sexual activity: Yes    Birth control/protection: IUD  Other Topics Concern  . None  Social History Narrative   Patient lives with husband and one child, a son named Max.    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         Objective:  Physical Exam  Constitutional: She is oriented to person, place, and time. Vital signs are normal. She appears well-developed and well-nourished. She is active and cooperative. No distress.  BP 110/82   Pulse 88   Temp 98.1 F (36.7 C)   Resp 16   Ht 5\' 6"  (1.676 m)   Wt 199 lb 12.8 oz (90.6 kg)   SpO2 97%   BMI 32.25 kg/m    HENT:  Head: Normocephalic and atraumatic.  Right Ear: Hearing, tympanic membrane, external ear and ear canal normal. No foreign bodies.  Left Ear: Hearing, tympanic membrane, external ear and ear canal normal. No foreign bodies.  Nose: Nose normal.  Mouth/Throat: Uvula is midline, oropharynx is clear and moist and mucous membranes are normal. No oral lesions. Normal dentition. No dental abscesses or uvula swelling. No oropharyngeal exudate.  Eyes: Pupils are equal, round, and reactive to light. Conjunctivae, EOM and lids are normal. Right eye exhibits no discharge. Left eye exhibits no discharge. No scleral icterus.  Fundoscopic exam:      The right eye shows no arteriolar narrowing, no AV nicking, no exudate, no hemorrhage and no papilledema. The right eye shows red reflex.       The left  eye shows no arteriolar narrowing, no AV nicking, no exudate, no hemorrhage and no papilledema. The left eye shows red reflex.  Neck: Trachea normal, normal range of motion and full passive range of motion without pain. Neck supple. No spinous process tenderness and no muscular tenderness present. No thyroid mass and no thyromegaly present.  Cardiovascular: Normal rate, regular rhythm, normal heart sounds, intact distal pulses and normal pulses.  Pulmonary/Chest: Effort normal and breath sounds normal.  Musculoskeletal: She exhibits no edema or tenderness.       Cervical back: Normal.       Thoracic back: Normal.       Lumbar back: Normal.  Lymphadenopathy:       Head (right side): No  tonsillar, no preauricular, no posterior auricular and no occipital adenopathy present.       Head (left side): No tonsillar, no preauricular, no posterior auricular and no occipital adenopathy present.    She has no cervical adenopathy.       Right: No supraclavicular adenopathy present.       Left: No supraclavicular adenopathy present.  Neurological: She is alert and oriented to person, place, and time. She has normal strength and normal reflexes. No cranial nerve deficit. She exhibits normal muscle tone. Coordination and gait normal.  Skin: Skin is warm, dry and intact. No rash noted. She is not diaphoretic. No cyanosis or erythema. Nails show no clubbing.  Psychiatric: She has a normal mood and affect. Her speech is normal and behavior is normal. Judgment and thought content normal.     Wt Readings from Last 3 Encounters:  02/01/18 199 lb 12.8 oz (90.6 kg)  11/09/17 215 lb 9.6 oz (97.8 kg)  11/01/17 198 lb (89.8 kg)     Visual Acuity Screening   Right eye Left eye Both eyes  Without correction: 20/15 20/20 20/15   With correction:            Assessment & Plan:   Problem List Items Addressed This Visit    ANEMIA-UNSPECIFIED   Relevant Orders   CBC with Differential/Platelet (Completed)   Headache   Relevant Medications   naproxen sodium (ALEVE) 220 MG tablet   Other Relevant Orders   Ambulatory referral to Neurology   Motion sickness   Relevant Medications   promethazine (PHENERGAN) 25 MG tablet   scopolamine (TRANSDERM-SCOP) 1 MG/3DAYS   History of migraine   RESOLVED: Mammographic breast lesion    Other Visit Diagnoses    Annual physical exam    -  Primary   Age-appropriate health guidance provided   Screening for hyperlipidemia       Relevant Orders   Lipid panel (Completed)   Screening for metabolic disorder       Relevant Orders   Comprehensive metabolic panel (Completed)   Screening for thyroid disorder       Relevant Orders   TSH (Completed)    Screening for blood or protein in urine       Relevant Orders   Urinalysis, dipstick only (Completed)       Return in about 1 year (around 02/02/2019) for Annual exam..   Fara Chute, PA-C [facility] [place]

## 2018-02-01 NOTE — Patient Instructions (Addendum)
IF you received an x-ray today, you will receive an invoice from [facility]. Please contact [facility] at [phone] with questions or concerns regarding your invoice.   IF you received labwork today, you will receive an invoice from [facility]. Please contact [facility] at [phone] with questions or concerns regarding your invoice.   Our billing staff will not be able to assist you with questions regarding bills from these companies.  You will be contacted with the lab results as soon as they are available. The fastest way to get your results is to activate your My Chart account. Instructions are located on the last page of this paperwork. If you have not heard from [place] regarding the results in 2 weeks, please contact this office.      Preventive Care 40-64 Years, Female Preventive care refers to lifestyle choices and visits with your health care provider that can promote health and wellness. What does preventive care include?  A yearly physical exam. This is also called an annual well check.  Dental exams once or twice a year.  Routine eye exams. Ask your health care provider how often you should have your eyes checked.  Personal lifestyle choices, including: ? Daily care of your teeth and gums. ? Regular physical activity. ? Eating a healthy diet. ? Avoiding tobacco and drug use. ? Limiting alcohol use. ? Practicing safe sex. ? Taking low-dose aspirin daily starting at age 34. ? Taking vitamin and mineral supplements as recommended by your health care provider. What happens during an annual well check? The services and screenings done by your health care provider during your annual well check will depend on your age, overall health, lifestyle risk factors, and family history of disease. Counseling Your health care provider may ask you questions about your:  Alcohol use.  Tobacco use.  Drug use.  Emotional well-being.  Home and relationship  well-being.  Sexual activity.  Eating habits.  Work and work Statistician.  Method of birth control.  Menstrual cycle.  Pregnancy history.  Screening You may have the following tests or measurements:  Height, weight, and BMI.  Blood pressure.  Lipid and cholesterol levels. These may be checked every 5 years, or more frequently if you are over 65 years old.  Skin check.  Lung cancer screening. You may have this screening every year starting at age 102 if you have a 30-pack-year history of smoking and currently smoke or have quit within the past 15 years.  Fecal occult blood test (FOBT) of the stool. You may have this test every year starting at age 33.  Flexible sigmoidoscopy or colonoscopy. You may have a sigmoidoscopy every 5 years or a colonoscopy every 10 years starting at age 23.  Hepatitis C blood test.  Hepatitis B blood test.  Sexually transmitted disease (STD) testing.  Diabetes screening. This is done by checking your blood sugar (glucose) after you have not eaten for a while (fasting). You may have this done every 1-3 years.  Mammogram. This may be done every 1-2 years. Talk to your health care provider about when you should start having regular mammograms. This may depend on whether you have a family history of breast cancer.  BRCA-related cancer screening. This may be done if you have a family history of breast, ovarian, tubal, or peritoneal cancers.  Pelvic exam and Pap test. This may be done every 3 years starting at age 3. Starting at age 55, this may be done every 5 years if  you have a Pap test in combination with an HPV test.  Bone density scan. This is done to screen for osteoporosis. You may have this scan if you are at high risk for osteoporosis.  Discuss your test results, treatment options, and if necessary, the need for more tests with your health care provider. Vaccines Your health care provider may recommend certain vaccines, such  as:  Influenza vaccine. This is recommended every year.  Tetanus, diphtheria, and acellular pertussis (Tdap, Td) vaccine. You may need a Td booster every 10 years.  Varicella vaccine. You may need this if you have not been vaccinated.  Zoster vaccine. You may need this after age 71.  Measles, mumps, and rubella (MMR) vaccine. You may need at least one dose of MMR if you were born in 1957 or later. You may also need a second dose.  Pneumococcal 13-valent conjugate (PCV13) vaccine. You may need this if you have certain conditions and were not previously vaccinated.  Pneumococcal polysaccharide (PPSV23) vaccine. You may need one or two doses if you smoke cigarettes or if you have certain conditions.  Meningococcal vaccine. You may need this if you have certain conditions.  Hepatitis A vaccine. You may need this if you have certain conditions or if you travel or work in places where you may be exposed to hepatitis A.  Hepatitis B vaccine. You may need this if you have certain conditions or if you travel or work in places where you may be exposed to hepatitis B.  Haemophilus influenzae type b (Hib) vaccine. You may need this if you have certain conditions.  Talk to your health care provider about which screenings and vaccines you need and how often you need them. This information is not intended to replace advice given to you by your health care provider. Make sure you discuss any questions you have with your health care provider. Document Released: 11/28/2015 Document Revised: 07/21/2016 Document Reviewed: 09/02/2015 Elsevier Interactive Patient Education  [employer].

## 2018-02-02 LAB — COMPREHENSIVE METABOLIC PANEL
A/G RATIO: 1.5 (ref 1.2–2.2)
ALBUMIN: 4.6 g/dL (ref 3.5–5.5)
ALK PHOS: 85 IU/L (ref 39–117)
ALT: 14 IU/L (ref 0–32)
AST: 17 IU/L (ref 0–40)
BUN/Creatinine Ratio: 14 (ref 9–23)
BUN: 10 mg/dL (ref 6–24)
Bilirubin Total: 0.5 mg/dL (ref 0.0–1.2)
CO2: 26 mmol/L (ref 20–29)
Calcium: 9.7 mg/dL (ref 8.7–10.2)
Chloride: 103 mmol/L (ref 96–106)
Creatinine, Ser: 0.74 mg/dL (ref 0.57–1.00)
GFR calc Af Amer: 117 mL/min/{1.73_m2} (ref >59)
GFR, EST NON AFRICAN AMERICAN: 102 mL/min/{1.73_m2} (ref >59)
GLOBULIN, TOTAL: 3 g/dL (ref 1.5–4.5)
Glucose: 87 mg/dL (ref 65–99)
POTASSIUM: 4.4 mmol/L (ref 3.5–5.2)
SODIUM: 142 mmol/L (ref 134–144)
Total Protein: 7.6 g/dL (ref 6.0–8.5)

## 2018-02-02 LAB — CBC WITH DIFFERENTIAL/PLATELET
BASOS: 0 %
Basophils Absolute: 0 10*3/uL (ref 0.0–0.2)
EOS (ABSOLUTE): 0.3 10*3/uL (ref 0.0–0.4)
EOS: 6 %
HEMATOCRIT: 45.9 % (ref 34.0–46.6)
HEMOGLOBIN: 15.7 g/dL (ref 11.1–15.9)
Immature Grans (Abs): 0 10*3/uL (ref 0.0–0.1)
Immature Granulocytes: 0 %
LYMPHS ABS: 1.8 10*3/uL (ref 0.7–3.1)
Lymphs: 36 %
MCH: 29.1 pg (ref 26.6–33.0)
MCHC: 34.2 g/dL (ref 31.5–35.7)
MCV: 85 fL (ref 79–97)
MONOS ABS: 0.4 10*3/uL (ref 0.1–0.9)
Monocytes: 8 %
NEUTROS ABS: 2.5 10*3/uL (ref 1.4–7.0)
Neutrophils: 50 %
Platelets: 216 10*3/uL (ref 150–379)
RBC: 5.39 x10E6/uL — ABNORMAL HIGH (ref 3.77–5.28)
RDW: 13.1 % (ref 12.3–15.4)
WBC: 4.9 10*3/uL (ref 3.4–10.8)

## 2018-02-02 LAB — TSH: TSH: 1.5 u[IU]/mL (ref 0.450–4.500)

## 2018-02-02 LAB — LIPID PANEL
CHOL/HDL RATIO: 3.7 ratio (ref 0.0–4.4)
CHOLESTEROL TOTAL: 140 mg/dL (ref 100–199)
HDL: 38 mg/dL — ABNORMAL LOW (ref >39)
LDL Calculated: 88 mg/dL (ref 0–99)
TRIGLYCERIDES: 72 mg/dL (ref 0–149)
VLDL Cholesterol Cal: 14 mg/dL (ref 5–40)

## 2018-02-02 LAB — URINALYSIS, DIPSTICK ONLY
BILIRUBIN UA: NEGATIVE
GLUCOSE, UA: NEGATIVE
Ketones, UA: NEGATIVE
Nitrite, UA: NEGATIVE
PROTEIN UA: NEGATIVE
RBC UA: NEGATIVE
Specific Gravity, UA: 1.009 (ref 1.005–1.030)
UUROB: 0.2 mg/dL (ref 0.2–1.0)
pH, UA: 8 — ABNORMAL HIGH (ref 5.0–7.5)

## 2018-02-16 ENCOUNTER — Encounter: Payer: Self-pay | Admitting: Physician Assistant

## 2018-02-20 ENCOUNTER — Encounter: Payer: Self-pay | Admitting: Physician Assistant

## 2018-02-21 MED ORDER — CLONAZEPAM 0.5 MG PO TABS: 0.5000 mg | ORAL_TABLET | Freq: Two times a day (BID) | ORAL | 0 refills | Status: DC | PRN

## 2018-02-21 MED FILL — ATOVAQUONE-PROGUANIL 250-10: 250-100 | 21 days supply | Qty: 21 | Fill #0

## 2018-02-21 MED FILL — CIPROFLOXACIN HCL 500 MG TA: 500 | 3 days supply | Qty: 6 | Fill #0

## 2018-02-21 MED FILL — TRANSDERM-SCOP 1.5 MG/72HR: 1 | 15 days supply | Qty: 5 | Fill #0

## 2018-02-21 MED FILL — clonazePAM 0.5 MG TABS: 0.5 | 10 days supply | Qty: 20 | Fill #0

## 2018-02-21 MED FILL — VIVOTIF EC CAPSULE: 8 days supply | Qty: 4 | Fill #0

## 2018-02-21 MED FILL — PROMETHAZINE 25 MG TABLET: 25 | 20 days supply | Qty: 60 | Fill #0

## 2018-03-01 ENCOUNTER — Telehealth: Payer: 59 | Admitting: Family

## 2018-03-01 DIAGNOSIS — J019 Acute sinusitis, unspecified: Secondary | ICD-10-CM

## 2018-03-01 MED ORDER — AMOXICILLIN-POT CLAVULANATE 875-125 MG PO TABS: 1.0000 | ORAL_TABLET | Freq: Two times a day (BID) | ORAL | 0 refills | Status: DC

## 2018-03-01 MED FILL — AMOX TR-K CLV 875-125 MG TA: 875-125 | 7 days supply | Qty: 14 | Fill #0

## 2018-03-01 NOTE — Progress Notes (Signed)

## 2018-03-24 ENCOUNTER — Ambulatory Visit: Payer: Self-pay

## 2018-03-24 ENCOUNTER — Other Ambulatory Visit: Payer: Self-pay | Admitting: Occupational Medicine

## 2018-03-24 DIAGNOSIS — M79644 Pain in right finger(s): Secondary | ICD-10-CM

## 2018-03-27 ENCOUNTER — Encounter: Payer: Self-pay | Admitting: Neurology

## 2018-03-27 ENCOUNTER — Other Ambulatory Visit: Payer: Self-pay

## 2018-03-27 ENCOUNTER — Ambulatory Visit (INDEPENDENT_AMBULATORY_CARE_PROVIDER_SITE_OTHER): Payer: 59 | Admitting: Neurology

## 2018-03-27 VITALS — BP 114/71 | HR 80 | Resp 16 | Ht 66.0 in | Wt 193.0 lb

## 2018-03-27 DIAGNOSIS — G43109 Migraine with aura, not intractable, without status migrainosus: Secondary | ICD-10-CM | POA: Diagnosis not present

## 2018-03-27 MED ORDER — ONDANSETRON 4 MG PO TBDP: 4.0000 mg | ORAL_TABLET | Freq: Three times a day (TID) | ORAL | 6 refills | Status: DC | PRN

## 2018-03-27 MED ORDER — SUMATRIPTAN SUCCINATE 6 MG/0.5ML ~~LOC~~ SOLN: 6.0000 mg | SUBCUTANEOUS | 6 refills | Status: DC | PRN

## 2018-03-27 MED ORDER — RIZATRIPTAN BENZOATE 10 MG PO TBDP: 10.0000 mg | ORAL_TABLET | ORAL | 6 refills | Status: DC | PRN

## 2018-03-27 MED FILL — RIZATRIPTAN 10 MG ODT: 10 | 30 days supply | Qty: 15 | Fill #0

## 2018-03-27 MED FILL — ONDANSETRON ODT 4 MG TABLET: 4 | 7 days supply | Qty: 20 | Fill #0

## 2018-03-27 NOTE — Patient Instructions (Signed)
Magnesium oxide 400 mg twice a day Riboflavin 100 mg twice a day as migraine prevention

## 2018-03-27 NOTE — Progress Notes (Signed)
PATIENT: Emma Mathews DOB: [birth year]  Chief Complaint  Patient presents with  . Headache    PCP: Harrison Mons, PA. Here alone for eval of h/a's onset in childhood.  Unable to tolerate Topamax.  Imitrex initially helped, then became less effective.  Maxalt helps, but she has not had a rx. for this in yrs.  Currently taking otc Advil or Goody Powders./fim     HISTORICAL  TERAN DAUGHENBAUGH is a 42 years old female, seen in refer by her primary care PA Harrison Mons for evaluation of chronic migraine headache, initial evaluation was on Mar 27, 2018.  She reported history of migraine since 42 years old, always started with visual aura, starburst at the left visual field, lasting for 30 minutes, followed by lateralized severe pounding headache with associated light noise sensitivity, nauseous, lasting for hours to days.  Initially she responded very well to Imitrex, but seems to gradually losses efficacy, she was treated with Maxalt for a while, has moderate to respond.    Since 2018, she began to have increased frequency headaches, she used to have headaches 2-3 times each year, now she can have headaches couple times a month, even more frequent,  She is taking over-the-counter Excedrin Migraine 2 to 3 tablets for each migraine, sometimes mixed with BC powder.  Sometimes she woke up from sleep with severe prolonged migraine headaches," nothing would help those headache"  She was treated in the Topamax in 2009 for 2 months as migraine prevention, which does help her sleep better, helps her migraine, but she complains of mental slowing,  Trigger for her migraines are bright light, hungry, stress, she has IUD now  REVIEW OF SYSTEMS: Full 14 system review of systems performed and notable only for snoring, headaches, allergy  ALLERGIES: Allergies  Allergen Reactions  . Contrast Media [Iodinated Diagnostic Agents] Shortness Of Breath, Nausea And Vomiting and Other (See Comments)   Shortness of breath  . Diphenhydramine Hcl Hives and Other (See Comments)    Pt states she can take Dye free Benadryl  . Chlorhexidine Gluconate Hives  . Codeine Hives, Nausea And Vomiting and Rash    HOME MEDICATIONS: Current Outpatient Medications  Medication Sig Dispense Refill  . levonorgestrel (MIRENA, 52 MG,) 20 MCG/24HR IUD Mirena 20 mcg/24 hours (5 yrs) 52 mg intrauterine device    . mometasone (NASONEX) 50 MCG/ACT nasal spray Place into the nose.    . Multiple Vitamin (MULTIVITAMIN WITH MINERALS) TABS tablet Take 1 tablet by mouth at bedtime.    Marland Kitchen amoxicillin-clavulanate (AUGMENTIN) 875-125 MG tablet Take 1 tablet by mouth 2 (two) times daily. (Patient not taking: Reported on 03/27/2018) 14 tablet 0  . clonazePAM (KLONOPIN) 0.5 MG tablet Take 1 tablet (0.5 mg total) by mouth 2 (two) times daily as needed for anxiety. (Patient not taking: Reported on 03/27/2018) 20 tablet 0  . naproxen sodium (ALEVE) 220 MG tablet Take by mouth.    . promethazine (PHENERGAN) 25 MG tablet Take 1 tablet (25 mg total) by mouth every 8 (eight) hours as needed for nausea or vomiting. (Patient not taking: Reported on 03/27/2018) 60 tablet 1  . scopolamine (TRANSDERM-SCOP) 1 MG/3DAYS Place 1 patch (1.5 mg total) onto the skin every 3 (three) days. 10 patch 12   No current facility-administered medications for this visit.     PAST MEDICAL HISTORY: Past Medical History:  Diagnosis Date  . Appendicitis, acute 10/02/2013  . Cancer ([place])    melanoma excision in 2013  .  Cholecystitis   . Complication of anesthesia    slow to wake up  . Headache    migraine  . Mammographic breast lesion 06/28/2017   S/p biopsy, benign   . Pneumonia 2010  . PONV (postoperative nausea and vomiting)   . Seasonal allergies   . STRESS FRACTURE, FOOT 04/08/2009   Qualifier: Diagnosis of  By: Arnoldo Morale MD, Balinda Quails Vision abnormalities     PAST SURGICAL HISTORY: Past Surgical History:  Procedure Laterality Date  .  APPENDECTOMY    . CHOLECYSTECTOMY    . EYE SURGERY Bilateral    PRK  . Labrum Repair Right 2000   Open  . LAPAROSCOPIC APPENDECTOMY N/A 09/02/2013   Procedure: APPENDECTOMY LAPAROSCOPIC;  Surgeon: Harl Bowie, MD;  Location: [facility];  Service: General;  Laterality: N/A;  . SHOULDER ARTHROSCOPY Left 2005   bone spurs   . SHOULDER ARTHROSCOPY Right 11/01/2017   Procedure: RIGHT SHOULDER ARTHROSCOPY WITH SUBACROMIAL DECOMPRESSION;  Surgeon: Meredith Pel, MD;  Location: [facility];  Service: Orthopedics;  Laterality: Right;  . SHOULDER ARTHROSCOPY WITH SUBACROMIAL DECOMPRESSION, ROTATOR CUFF REPAIR AND BICEP TENDON REPAIR Right 02/24/2016   Procedure: RIGHT SHOULDER ARTHROSCOPY WITH DEBRIDEMENT, BICEPS TENODESIS;  Surgeon: Meredith Pel, MD;  Location: [facility];  Service: Orthopedics;  Laterality: Right;  . SHOULDER SURGERY Right 2007   Revision and repair flap  . WISDOM TOOTH EXTRACTION    . WRIST ARTHROSCOPY Right 11/09/2017   Procedure: ARTHROSCOPY WRIST WITH DEBRIDEMENT;  Surgeon: Charlotte Crumb, MD;  Location: [facility];  Service: Orthopedics;  Laterality: Right;    FAMILY HISTORY: Family History  Adopted: Yes  Family history unknown: Yes    SOCIAL HISTORY:  Social History   Socioeconomic History  . Marital status: Married    Spouse name: Marjory Lies  . Number of children: 1  . Years of education: Not on file  . Highest education level: Not on file  Occupational History  . Not on file  Social Needs  . Financial resource strain: Not on file  . Food insecurity:    Worry: Not on file    Inability: Not on file  . Transportation needs:    Medical: Not on file    Non-medical: Not on file  Tobacco Use  . Smoking status: Never Smoker  . Smokeless tobacco: Never Used  Substance and Sexual Activity  . Alcohol use: Yes    Comment: ocassional - social 1- 2 drinks  . Drug use: No  . Sexual activity: Yes    Birth control/protection: IUD  Lifestyle  .  Physical activity:    Days per week: Not on file    Minutes per session: Not on file  . Stress: Not on file  Relationships  . Social connections:    Talks on phone: Not on file    Gets together: Not on file    Attends religious service: Not on file    Active member of club or organization: Not on file    Attends meetings of clubs or organizations: Not on file    Relationship status: Not on file  . Intimate partner violence:    Fear of current or ex partner: Not on file    Emotionally abused: Not on file    Physically abused: Not on file    Forced sexual activity: Not on file  Other Topics Concern  . Not on file  Social History Narrative   Patient lives with husband and one child,  a son named Max.    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      PHYSICAL EXAM   Vitals:   03/27/18 0809  BP: 114/71  Pulse: 80  Resp: 16  Weight: 193 lb (87.5 kg)  Height: 5\' 6"  (1.676 m)    Not recorded      Body mass index is 31.15 kg/m.  PHYSICAL EXAMNIATION:  Gen: NAD, conversant, well nourised, obese, well groomed                     Cardiovascular: Regular rate rhythm, no peripheral edema, warm, nontender. Eyes: Conjunctivae clear without exudates or hemorrhage Neck: Supple, no carotid bruits. Pulmonary: Clear to auscultation bilaterally   NEUROLOGICAL EXAM:  MENTAL STATUS: Speech:    Speech is normal; fluent and spontaneous with normal comprehension.  Cognition:     Orientation to time, place and person     Normal recent and remote memory     Normal Attention span and concentration     Normal Language, naming, repeating,spontaneous speech     Fund of knowledge   CRANIAL NERVES: CN II: Visual fields are full to confrontation. Fundoscopic exam is normal with sharp discs and no vascular changes. Pupils are round equal and briskly reactive to light. CN III, IV, VI: extraocular movement are normal. No ptosis. CN V: Facial sensation is  intact to pinprick in all 3 divisions bilaterally. Corneal responses are intact.  CN VII: Face is symmetric with normal eye closure and smile. CN VIII: Hearing is normal to rubbing fingers CN IX, X: Palate elevates symmetrically. Phonation is normal. CN XI: Head turning and shoulder shrug are intact CN XII: Tongue is midline with normal movements and no atrophy.  MOTOR: There is no pronator drift of out-stretched arms. Muscle bulk and tone are normal. Muscle strength is normal.  REFLEXES: Reflexes are 2+ and symmetric at the biceps, triceps, knees, and ankles. Plantar responses are flexor.  SENSORY: Intact to light touch, pinprick, positional sensation and vibratory sensation are intact in fingers and toes.  COORDINATION: Rapid alternating movements and fine finger movements are intact. There is no dysmetria on finger-to-nose and heel-knee-shin.    GAIT/STANCE: Posture is normal. Gait is steady with normal steps, base, arm swing, and turning. Heel and toe walking are normal. Tandem gait is normal.  Romberg is absent.   DIAGNOSTIC DATA (LABS, IMAGING, TESTING) - I reviewed patient records, labs, notes, testing and imaging myself where available.   ASSESSMENT AND PLAN  MANVI GUILLIAMS is a 42 y.o. female   Chronic migraine with aura  Magnesium oxide 400 mg twice a day, riboflavin 100 mg twice a day as migraine prevention  Maxalt 10 mg dissolvable as needed, may mix together with Zofran,  Imitrex subcutaneous for severe prolonged migraine headaches,   Marcial Pacas, M.D. Ph.D.  [facility] [address] [place], [place] [address] Ph: [phone] Fax: [phone]  CC: Harrison Mons, PA-C

## 2018-03-28 ENCOUNTER — Encounter: Payer: Self-pay | Admitting: Neurology

## 2018-03-31 ENCOUNTER — Other Ambulatory Visit: Payer: Self-pay | Admitting: *Deleted

## 2018-03-31 MED ORDER — SUMATRIPTAN SUCCINATE 6 MG/0.5ML ~~LOC~~ SOAJ: SUBCUTANEOUS | 5 refills | Status: AC

## 2018-06-10 IMAGING — DX DG CHEST 2V
2 series · 2 of 2 positions shown · non-contrast
Comparison: 12/24/2005

CLINICAL DATA: Cough for 1 month.

EXAM:
CHEST  2 VIEW

[chest pa]
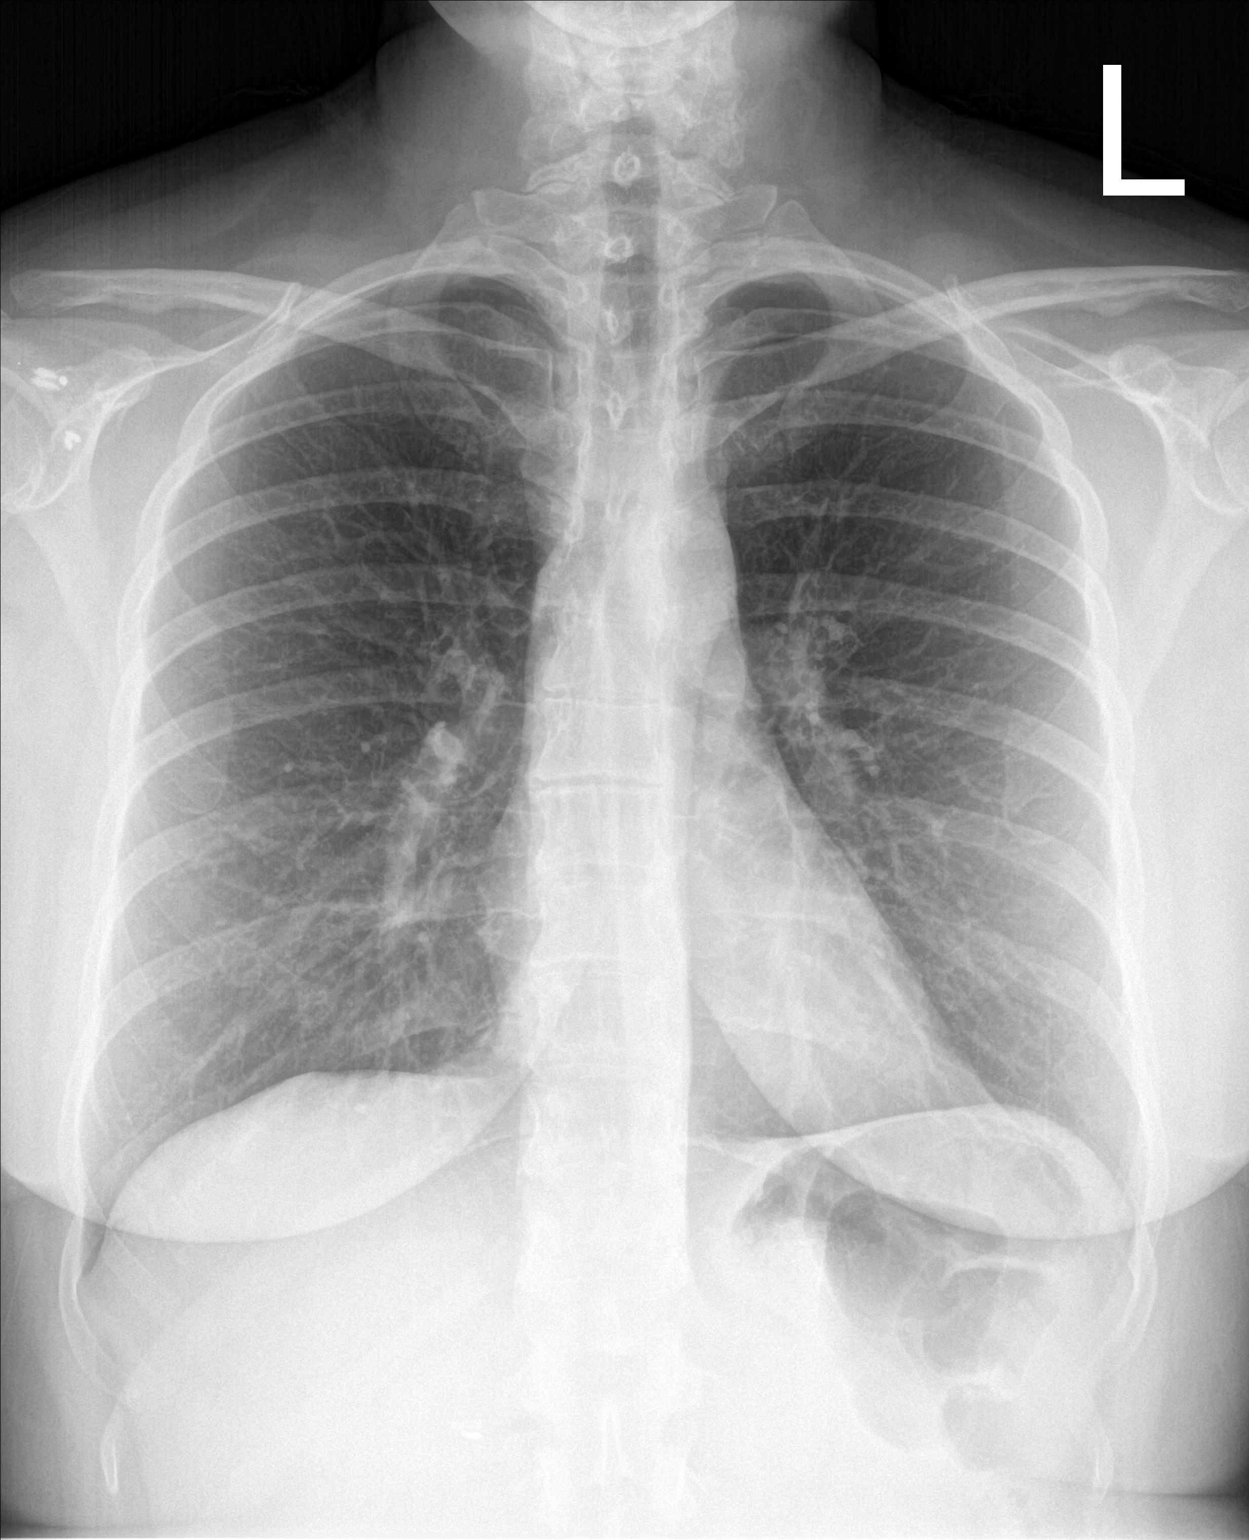

[chest lat]
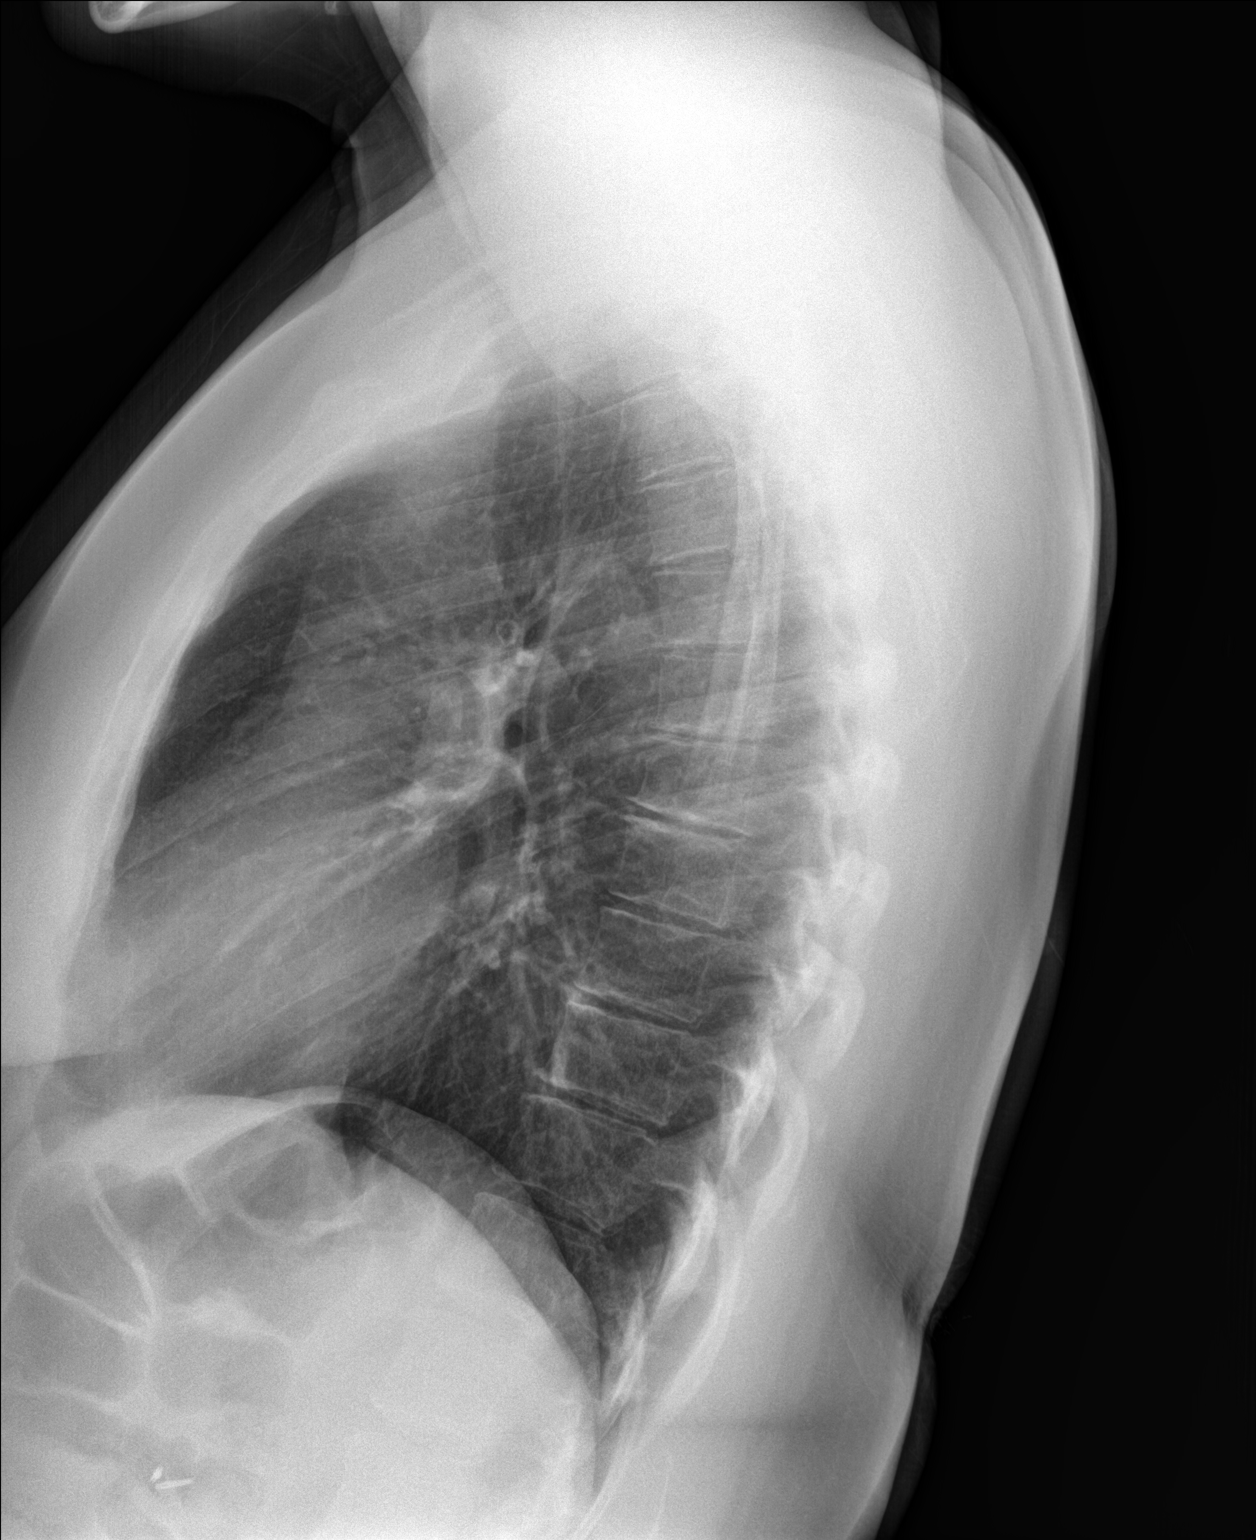

[2 of 2 positions shown; findings below may reference images not displayed]

FINDINGS: Heart and mediastinal contours are within normal limits. No focal
opacities or effusions. No acute bony abnormality.
IMPRESSION: No active cardiopulmonary disease.

## 2018-06-22 DIAGNOSIS — R21 Rash and other nonspecific skin eruption: Secondary | ICD-10-CM | POA: Diagnosis not present

## 2018-06-22 DIAGNOSIS — L299 Pruritus, unspecified: Secondary | ICD-10-CM | POA: Diagnosis not present

## 2018-06-22 MED FILL — TRIAMCINOLONE 0.5% CREAM: 0.5 | 15 days supply | Qty: 15 | Fill #0

## 2018-07-06 ENCOUNTER — Other Ambulatory Visit: Payer: Self-pay

## 2018-07-06 ENCOUNTER — Ambulatory Visit (INDEPENDENT_AMBULATORY_CARE_PROVIDER_SITE_OTHER): Payer: 59 | Admitting: Neurology

## 2018-07-06 ENCOUNTER — Encounter: Payer: Self-pay | Admitting: Neurology

## 2018-07-06 VITALS — BP 120/71 | HR 68 | Resp 16 | Ht 66.0 in | Wt 192.0 lb

## 2018-07-06 DIAGNOSIS — G43109 Migraine with aura, not intractable, without status migrainosus: Secondary | ICD-10-CM

## 2018-07-06 NOTE — Progress Notes (Signed)
PATIENT: Emma Mathews DOB: 02-22-1976  Chief Complaint  Patient presents with  . Migraine    Rm. 4.  Sts. she has recognized weather as a trigger for migraines. Sts. Maxalt helps/fim     HISTORICAL  Emma Mathews is a 42 years old female, seen in refer by her primary care PA Harrison Mons for evaluation of chronic migraine headache, initial evaluation was on Mar 27, 2018.  She reported history of migraine since 42 years old, always started with visual aura, starburst at the left visual field, lasting for 30 minutes, followed by lateralized severe pounding headache with associated light noise sensitivity, nauseous, lasting for hours to days.  Initially she responded very well to Imitrex, but seems to gradually losses efficacy, she was treated with Maxalt for a while, has moderate to respond.    Since 2018, she began to have increased frequency headaches, she used to have headaches 2-3 times each year, now she can have headaches couple times a month, even more frequent,  She is taking over-the-counter Excedrin Migraine 2 to 3 tablets for each migraine, sometimes mixed with BC powder.  Sometimes she woke up from sleep with severe prolonged migraine headaches," nothing would help those headache"  She was treated in the Topamax in 2009 for 2 months as migraine prevention, which does help her sleep better, helps her migraine, but she complains of mental slowing,  Trigger for her migraines are bright light, hungry, stress, she has IUD now  UPDATE July 06 2018: Her migraine headache overall has much improved, only had 3 major migraines over the past 3 months, Maxalt as needed was helpful,  REVIEW OF SYSTEMS: Full 14 system review of systems performed and notable only for snoring, headaches, allergy  ALLERGIES: Allergies  Allergen Reactions  . Contrast Media [Iodinated Diagnostic Agents] Shortness Of Breath, Nausea And Vomiting and Other (See Comments)    Shortness of breath   . Diphenhydramine Hcl Hives and Other (See Comments)    Pt states she can take Dye free Benadryl  . Chlorhexidine Gluconate Hives  . Codeine Hives, Nausea And Vomiting and Rash    HOME MEDICATIONS: Current Outpatient Medications  Medication Sig Dispense Refill  . levonorgestrel (MIRENA, 52 MG,) 20 MCG/24HR IUD Mirena 20 mcg/24 hours (5 yrs) 52 mg intrauterine device    . mometasone (NASONEX) 50 MCG/ACT nasal spray Place into the nose.    . Multiple Vitamin (MULTIVITAMIN WITH MINERALS) TABS tablet Take 1 tablet by mouth at bedtime.    . ondansetron (ZOFRAN ODT) 4 MG disintegrating tablet Take 1 tablet (4 mg total) by mouth every 8 (eight) hours as needed. 20 tablet 6  . rizatriptan (MAXALT-MLT) 10 MG disintegrating tablet Take 1 tablet (10 mg total) by mouth as needed. May repeat in 2 hours if needed 15 tablet 6  . SUMAtriptan 6 MG/0.5ML SOAJ Inject 6mg  at onset of migraine.  May repeat in 2 hrs, if needed.  Emma Mathews dose: 2 injections/day. This is a 30 day prescription. 10 Cartridge 5   No current facility-administered medications for this visit.     PAST MEDICAL HISTORY: Past Medical History:  Diagnosis Date  . Appendicitis, acute 10/02/2013  . Cancer ([place])    melanoma excision in 2013  . Cholecystitis   . Complication of anesthesia    slow to wake up  . Headache    migraine  . Mammographic breast lesion 06/28/2017   S/p biopsy, benign   . Pneumonia 2010  . PONV (postoperative  nausea and vomiting)   . Seasonal allergies   . STRESS FRACTURE, FOOT 04/08/2009   Qualifier: Diagnosis of  By: Arnoldo Morale MD, Balinda Quails Vision abnormalities     PAST SURGICAL HISTORY: Past Surgical History:  Procedure Laterality Date  . APPENDECTOMY    . CHOLECYSTECTOMY    . EYE SURGERY Bilateral    PRK  . Labrum Repair Right 2000   Open  . LAPAROSCOPIC APPENDECTOMY N/A 09/02/2013   Procedure: APPENDECTOMY LAPAROSCOPIC;  Surgeon: Harl Bowie, MD;  Location: [facility];  Service: General;   Laterality: N/A;  . SHOULDER ARTHROSCOPY Left 2005   bone spurs   . SHOULDER ARTHROSCOPY Right 11/01/2017   Procedure: RIGHT SHOULDER ARTHROSCOPY WITH SUBACROMIAL DECOMPRESSION;  Surgeon: Meredith Pel, MD;  Location: [facility];  Service: Orthopedics;  Laterality: Right;  . SHOULDER ARTHROSCOPY WITH SUBACROMIAL DECOMPRESSION, ROTATOR CUFF REPAIR AND BICEP TENDON REPAIR Right 02/24/2016   Procedure: RIGHT SHOULDER ARTHROSCOPY WITH DEBRIDEMENT, BICEPS TENODESIS;  Surgeon: Meredith Pel, MD;  Location: [facility];  Service: Orthopedics;  Laterality: Right;  . SHOULDER SURGERY Right 2007   Revision and repair flap  . WISDOM TOOTH EXTRACTION    . WRIST ARTHROSCOPY Right 11/09/2017   Procedure: ARTHROSCOPY WRIST WITH DEBRIDEMENT;  Surgeon: Charlotte Crumb, MD;  Location: [facility];  Service: Orthopedics;  Laterality: Right;    FAMILY HISTORY: Family History  Adopted: Yes  Family history unknown: Yes    SOCIAL HISTORY:  Social History   Socioeconomic History  . Marital status: Married    Spouse name: Marjory Lies  . Number of children: 1  . Years of education: Not on file  . Highest education level: Not on file  Occupational History  . Not on file  Social Needs  . Financial resource strain: Not on file  . Food insecurity:    Worry: Not on file    Inability: Not on file  . Transportation needs:    Medical: Not on file    Non-medical: Not on file  Tobacco Use  . Smoking status: Never Smoker  . Smokeless tobacco: Never Used  Substance and Sexual Activity  . Alcohol use: Yes    Comment: ocassional - social 1- 2 drinks  . Drug use: No  . Sexual activity: Yes    Birth control/protection: IUD  Lifestyle  . Physical activity:    Days per week: Not on file    Minutes per session: Not on file  . Stress: Not on file  Relationships  . Social connections:    Talks on phone: Not on file    Gets together: Not on file    Attends religious service: Not on file    Active  member of club or organization: Not on file    Attends meetings of clubs or organizations: Not on file    Relationship status: Not on file  . Intimate partner violence:    Fear of current or ex partner: Not on file    Emotionally abused: Not on file    Physically abused: Not on file    Forced sexual activity: Not on file  Other Topics Concern  . Not on file  Social History Narrative   Patient lives with husband and one child, a son named Emma Mathews.    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      PHYSICAL EXAM   Vitals:   07/06/18 0724  BP: 120/71  Pulse: 68  Resp:  16  Weight: 192 lb (87.1 kg)  Height: 5\' 6"  (1.676 m)    Not recorded      Body mass index is 30.99 kg/m.  PHYSICAL EXAMNIATION:  Gen: NAD, conversant, well nourised, obese, well groomed                     Cardiovascular: Regular rate rhythm, no peripheral edema, warm, nontender. Eyes: Conjunctivae clear without exudates or hemorrhage Neck: Supple, no carotid bruits. Pulmonary: Clear to auscultation bilaterally   NEUROLOGICAL EXAM:  MENTAL STATUS: Speech:    Speech is normal; fluent and spontaneous with normal comprehension.  Cognition:     Orientation to time, place and person     Normal recent and remote memory     Normal Attention span and concentration     Normal Language, naming, repeating,spontaneous speech     Fund of knowledge   CRANIAL NERVES: CN II: Visual fields are full to confrontation. Fundoscopic exam is normal with sharp discs and no vascular changes. Pupils are round equal and briskly reactive to light. CN III, IV, VI: extraocular movement are normal. No ptosis. CN V: Facial sensation is intact to pinprick in all 3 divisions bilaterally. Corneal responses are intact.  CN VII: Face is symmetric with normal eye closure and smile. CN VIII: Hearing is normal to rubbing fingers CN IX, X: Palate elevates symmetrically. Phonation is normal. CN XI: Head  turning and shoulder shrug are intact CN XII: Tongue is midline with normal movements and no atrophy.  MOTOR: There is no pronator drift of out-stretched arms. Muscle bulk and tone are normal. Muscle strength is normal.  REFLEXES: Reflexes are 2+ and symmetric at the biceps, triceps, knees, and ankles. Plantar responses are flexor.  SENSORY: Intact to light touch, pinprick, positional sensation and vibratory sensation are intact in fingers and toes.  COORDINATION: Rapid alternating movements and fine finger movements are intact. There is no dysmetria on finger-to-nose and heel-knee-shin.    GAIT/STANCE: Posture is normal. Gait is steady with normal steps, base, arm swing, and turning. Heel and toe walking are normal. Tandem gait is normal.  Romberg is absent.   DIAGNOSTIC DATA (LABS, IMAGING, TESTING) - I reviewed patient records, labs, notes, testing and imaging myself where available.   ASSESSMENT AND PLAN  VEDHA TERCERO is a 42 y.o. female   Chronic migraine with aura  Keep magnesium oxide 400 mg twice a day, riboflavin 100 mg twice a day as migraine prevention  Maxalt 10 mg dissolvable as needed   Marcial Pacas, M.D. Ph.D.  [facility] [address], [place], [place] [address] Ph: [phone] Fax: [phone]  CC: Harrison Mons, PA-C

## 2018-08-07 DIAGNOSIS — Z09 Encounter for follow-up examination after completed treatment for conditions other than malignant neoplasm: Secondary | ICD-10-CM | POA: Diagnosis not present

## 2018-08-07 DIAGNOSIS — Z1231 Encounter for screening mammogram for malignant neoplasm of breast: Secondary | ICD-10-CM | POA: Diagnosis not present

## 2018-08-18 MED FILL — SUMATRIPTAN 6 MG/0.5 ML VIA: 6 | 30 days supply | Qty: 3 | Fill #0

## 2018-08-18 MED FILL — RIZATRIPTAN 10 MG ODT: 10 | 30 days supply | Qty: 15 | Fill #1

## 2018-10-02 DIAGNOSIS — Z85820 Personal history of malignant melanoma of skin: Secondary | ICD-10-CM | POA: Diagnosis not present

## 2018-10-02 DIAGNOSIS — L910 Hypertrophic scar: Secondary | ICD-10-CM | POA: Diagnosis not present

## 2018-10-02 DIAGNOSIS — D2239 Melanocytic nevi of other parts of face: Secondary | ICD-10-CM | POA: Diagnosis not present

## 2018-10-02 DIAGNOSIS — L821 Other seborrheic keratosis: Secondary | ICD-10-CM | POA: Diagnosis not present

## 2018-10-02 DIAGNOSIS — D225 Melanocytic nevi of trunk: Secondary | ICD-10-CM | POA: Diagnosis not present

## 2018-10-02 DIAGNOSIS — Z872 Personal history of diseases of the skin and subcutaneous tissue: Secondary | ICD-10-CM | POA: Diagnosis not present

## 2018-10-02 DIAGNOSIS — D485 Neoplasm of uncertain behavior of skin: Secondary | ICD-10-CM | POA: Diagnosis not present

## 2018-10-25 DIAGNOSIS — R635 Abnormal weight gain: Secondary | ICD-10-CM | POA: Diagnosis not present

## 2018-10-25 DIAGNOSIS — R7989 Other specified abnormal findings of blood chemistry: Secondary | ICD-10-CM | POA: Diagnosis not present

## 2018-10-25 DIAGNOSIS — N951 Menopausal and female climacteric states: Secondary | ICD-10-CM | POA: Diagnosis not present

## 2018-10-27 DIAGNOSIS — Z1331 Encounter for screening for depression: Secondary | ICD-10-CM | POA: Diagnosis not present

## 2018-10-27 DIAGNOSIS — R5383 Other fatigue: Secondary | ICD-10-CM | POA: Diagnosis not present

## 2018-10-27 DIAGNOSIS — Z6831 Body mass index (BMI) 31.0-31.9, adult: Secondary | ICD-10-CM | POA: Diagnosis not present

## 2018-10-27 DIAGNOSIS — N951 Menopausal and female climacteric states: Secondary | ICD-10-CM | POA: Diagnosis not present

## 2018-10-27 DIAGNOSIS — Z1339 Encounter for screening examination for other mental health and behavioral disorders: Secondary | ICD-10-CM | POA: Diagnosis not present

## 2018-10-27 DIAGNOSIS — R7989 Other specified abnormal findings of blood chemistry: Secondary | ICD-10-CM | POA: Diagnosis not present

## 2018-10-27 DIAGNOSIS — R790 Abnormal level of blood mineral: Secondary | ICD-10-CM | POA: Diagnosis not present

## 2018-11-13 DIAGNOSIS — Z6831 Body mass index (BMI) 31.0-31.9, adult: Secondary | ICD-10-CM | POA: Diagnosis not present

## 2018-11-13 DIAGNOSIS — R7989 Other specified abnormal findings of blood chemistry: Secondary | ICD-10-CM | POA: Diagnosis not present

## 2018-11-13 DIAGNOSIS — Z713 Dietary counseling and surveillance: Secondary | ICD-10-CM | POA: Diagnosis not present

## 2018-11-14 MED FILL — PHENTERMINE HCL 15 MG CAPS: 15 | 14 days supply | Qty: 14 | Fill #0

## 2018-11-20 DIAGNOSIS — R790 Abnormal level of blood mineral: Secondary | ICD-10-CM | POA: Diagnosis not present

## 2018-11-20 DIAGNOSIS — Z713 Dietary counseling and surveillance: Secondary | ICD-10-CM | POA: Diagnosis not present

## 2018-11-20 DIAGNOSIS — Z6831 Body mass index (BMI) 31.0-31.9, adult: Secondary | ICD-10-CM | POA: Diagnosis not present

## 2018-11-30 DIAGNOSIS — R7989 Other specified abnormal findings of blood chemistry: Secondary | ICD-10-CM | POA: Diagnosis not present

## 2018-11-30 DIAGNOSIS — Z713 Dietary counseling and surveillance: Secondary | ICD-10-CM | POA: Diagnosis not present

## 2018-11-30 DIAGNOSIS — Z6830 Body mass index (BMI) 30.0-30.9, adult: Secondary | ICD-10-CM | POA: Diagnosis not present

## 2018-11-30 MED FILL — PHENTERMINE HCL 15 MG CAPS: 15 | 14 days supply | Qty: 14 | Fill #0

## 2018-12-07 DIAGNOSIS — R790 Abnormal level of blood mineral: Secondary | ICD-10-CM | POA: Diagnosis not present

## 2018-12-07 DIAGNOSIS — R7989 Other specified abnormal findings of blood chemistry: Secondary | ICD-10-CM | POA: Diagnosis not present

## 2018-12-07 DIAGNOSIS — Z6830 Body mass index (BMI) 30.0-30.9, adult: Secondary | ICD-10-CM | POA: Diagnosis not present

## 2018-12-15 DIAGNOSIS — R790 Abnormal level of blood mineral: Secondary | ICD-10-CM | POA: Diagnosis not present

## 2018-12-15 DIAGNOSIS — Z6830 Body mass index (BMI) 30.0-30.9, adult: Secondary | ICD-10-CM | POA: Diagnosis not present

## 2018-12-15 MED FILL — PHENTERMINE HCL 15 MG CAPS: 15 | 14 days supply | Qty: 14 | Fill #0

## 2018-12-22 DIAGNOSIS — Z6830 Body mass index (BMI) 30.0-30.9, adult: Secondary | ICD-10-CM | POA: Diagnosis not present

## 2018-12-22 DIAGNOSIS — R7989 Other specified abnormal findings of blood chemistry: Secondary | ICD-10-CM | POA: Diagnosis not present

## 2018-12-27 MED FILL — PHENTERMINE HCL 15 MG CAPS: 15 | 14 days supply | Qty: 14 | Fill #0

## 2018-12-27 MED FILL — RIZATRIPTAN 10 MG ODT: 10 | 30 days supply | Qty: 15 | Fill #2

## 2019-01-03 ENCOUNTER — Telehealth: Payer: 59 | Admitting: Family

## 2019-01-03 DIAGNOSIS — R21 Rash and other nonspecific skin eruption: Secondary | ICD-10-CM | POA: Diagnosis not present

## 2019-01-03 MED ORDER — TRIAMCINOLONE ACETONIDE 0.5 % EX CREA: 1.0000 "application " | TOPICAL_CREAM | Freq: Three times a day (TID) | CUTANEOUS | 0 refills | Status: DC

## 2019-01-03 MED FILL — TRIAMCINOLONE 0.5% CREAM: 0.5 | 10 days supply | Qty: 30 | Fill #0

## 2019-01-03 NOTE — Progress Notes (Signed)
E Visit for Rash  We are sorry that you are not feeling well. Here is how we plan to help!  Based on what you shared with me it looks like you have contact dermatitis.  Contact dermatitis is a skin rash caused by something that touches the skin and causes irritation or inflammation.  Your skin may be red, swollen, dry, cracked, and itch.  The rash should go away in a few days but can last a few weeks.  If you get a rash, it's important to figure out what caused it so the irritant can be avoided in the future.  I have sent in Kenalog cream to use twice a day. Avoid applying to your face or groin.   Approximately 5 minutes spent documenting and reviewing patient's chart.  HOME CARE:   Take cool showers and avoid direct sunlight.  Apply cool compress or wet dressings.  Take a bath in an oatmeal bath.  Sprinkle content of one Aveeno packet under running faucet with comfortably warm water.  Bathe for 15-20 minutes, 1-2 times daily.  Pat dry with a towel. Do not rub the rash.  Use hydrocortisone cream.  Take an antihistamine like Benadryl for widespread rashes that itch.  The adult dose of Benadryl is 25-50 mg by mouth 4 times daily.  Caution:  This type of medication may cause sleepiness.  Do not drink alcohol, drive, or operate dangerous machinery while taking antihistamines.  Do not take these medications if you have prostate enlargement.  Read package instructions thoroughly on all medications that you take.  GET HELP RIGHT AWAY IF:   Symptoms don't go away after treatment.  Severe itching that persists.  If you rash spreads or swells.  If you rash begins to smell.  If it blisters and opens or develops a yellow-brown crust.  You develop a fever.  You have a sore throat.  You become short of breath.  MAKE SURE YOU:  Understand these instructions. Will watch your condition. Will get help right away if you are not doing well or get worse.  Thank you for choosing an  e-visit. Your e-visit answers were reviewed by a board certified advanced clinical practitioner to complete your personal care plan. Depending upon the condition, your plan could have included both over the counter or prescription medications. Please review your pharmacy choice. Be sure that the pharmacy you have chosen is open so that you can pick up your prescription now.  If there is a problem you may message your provider in [place] to have the prescription routed to another pharmacy. Your safety is important to [place]. If you have drug allergies check your prescription carefully.  For the next 24 hours, you can use MyChart to ask questions about today's visit, request a non-urgent call back, or ask for a work or school excuse from your e-visit provider. You will get an email in the next two days asking about your experience. I hope that your e-visit has been valuable and will speed your recovery.

## 2019-01-05 DIAGNOSIS — R790 Abnormal level of blood mineral: Secondary | ICD-10-CM | POA: Diagnosis not present

## 2019-01-05 DIAGNOSIS — Z6830 Body mass index (BMI) 30.0-30.9, adult: Secondary | ICD-10-CM | POA: Diagnosis not present

## 2019-01-05 DIAGNOSIS — R7989 Other specified abnormal findings of blood chemistry: Secondary | ICD-10-CM | POA: Diagnosis not present

## 2019-01-15 DIAGNOSIS — Z6829 Body mass index (BMI) 29.0-29.9, adult: Secondary | ICD-10-CM | POA: Diagnosis not present

## 2019-01-15 DIAGNOSIS — R7989 Other specified abnormal findings of blood chemistry: Secondary | ICD-10-CM | POA: Diagnosis not present

## 2019-01-15 MED FILL — PHENTERMINE HCL 15 MG CAPS: 15 | 14 days supply | Qty: 14 | Fill #0

## 2019-01-22 ENCOUNTER — Ambulatory Visit (INDEPENDENT_AMBULATORY_CARE_PROVIDER_SITE_OTHER): Payer: 59 | Admitting: Orthopedic Surgery

## 2019-01-22 ENCOUNTER — Encounter (INDEPENDENT_AMBULATORY_CARE_PROVIDER_SITE_OTHER): Payer: Self-pay | Admitting: Orthopedic Surgery

## 2019-01-22 ENCOUNTER — Ambulatory Visit (INDEPENDENT_AMBULATORY_CARE_PROVIDER_SITE_OTHER): Payer: 59

## 2019-01-22 VITALS — Ht 66.0 in | Wt 192.0 lb

## 2019-01-22 DIAGNOSIS — G8929 Other chronic pain: Secondary | ICD-10-CM

## 2019-01-22 DIAGNOSIS — M722 Plantar fascial fibromatosis: Secondary | ICD-10-CM | POA: Diagnosis not present

## 2019-01-22 DIAGNOSIS — M79672 Pain in left foot: Secondary | ICD-10-CM | POA: Diagnosis not present

## 2019-01-22 NOTE — Progress Notes (Signed)
Office Visit Note   Patient: Emma Mathews           Date of Birth: July 11, 1976           MRN: [id number] Visit Date: 01/22/2019              Requested by: No referring provider defined for this encounter. PCP: Patient, No Pcp Per  Chief Complaint  Patient presents with  . Left Foot - Pain    Heel pain      HPI: Patient is a 43 year old woman who complains of 1 month history of plantar fascia pain left heel.  She states it is worse with start up in the morning she is used ice heat and Advil.  She states she has lost about 37 pounds and does not want to stop doing her exercises.  She does not hurt with a slight heel lift.  Assessment & Plan: Visit Diagnoses:  1. Chronic heel pain, left   2. Plantar fasciitis, left     Plan: Recommended continuing with a heel lift she was given instructions and demonstrated heel cord stretching recommended orthotics, no restrictions with activities.  Follow-Up Instructions: Return if symptoms worsen or fail to improve.   Ortho Exam  Patient is alert, oriented, no adenopathy, well-dressed, normal affect, normal respiratory effort. Examination patient is a good dorsalis pedis and posterior tibial pulse she has dorsiflexion about 10 degrees past neutral with her knee extended lateral compression of the calcaneus is not painful tarsal tunnel was not painful she is point tender to palpation over the origin the plantar fascia.  Imaging: Xr Os Calcis Left  Result Date: 01/22/2019 2 view radiographs of the left heel shows no bony spurs no evidence of a stress fracture.  No images are attached to the encounter.  Labs: Lab Results  Component Value Date   REPTSTATUS 09/03/2013 FINAL 09/02/2013   CULT  09/02/2013    Multiple bacterial morphotypes present, none predominant. Suggest appropriate recollection if clinically indicated. Performed at [facility] Results  Component Value Date   ALBUMIN 4.6 02/01/2018   ALBUMIN  4.0 12/11/2016   ALBUMIN 4.3 09/02/2013    Body mass index is 30.99 kg/m.  Orders:  Orders Placed This Encounter  Procedures  . XR Os Calcis Left   No orders of the defined types were placed in this encounter.    Procedures: No procedures performed  Clinical Data: No additional findings.  ROS:  All other systems negative, except as noted in the HPI. Review of Systems  Objective: Vital Signs: Ht 5\' 6"  (1.676 m)   Wt 192 lb (87.1 kg)   BMI 30.99 kg/m   Specialty Comments:  No specialty comments available.  PMFS History: Patient Active Problem List   Diagnosis Date Noted  . Chronic migraine with aura 03/27/2018  . Obesity 02/01/2018  . Headache 02/01/2018  . Motion sickness 02/01/2018  . History of migraine 02/01/2018  . 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 ADD 10/28/2009  . ANEMIA-UNSPECIFIED 04/28/2009  . ALLERGIC RHINITIS 02/25/2009   Past Medical History:  Diagnosis Date  . Appendicitis, acute 10/02/2013  . Cancer ([place])    melanoma excision in 2013  . Cholecystitis   . Complication of anesthesia    slow to wake up  . Headache    migraine  . Mammographic breast lesion 06/28/2017   S/p biopsy, benign   . Pneumonia 2010  . PONV (postoperative nausea and vomiting)   .  Seasonal allergies   . STRESS FRACTURE, FOOT 04/08/2009   Qualifier: Diagnosis of  By: Arnoldo Morale MD, [facility] abnormalities     Family History  Adopted: Yes  Family history unknown: Yes    Past Surgical History:  Procedure Laterality Date  . APPENDECTOMY    . CHOLECYSTECTOMY    . EYE SURGERY Bilateral    PRK  . Labrum Repair Right 2000   Open  . LAPAROSCOPIC APPENDECTOMY N/A 09/02/2013   Procedure: APPENDECTOMY LAPAROSCOPIC;  Surgeon: Harl Bowie, MD;  Location: [facility];  Service: General;  Laterality: N/A;  . SHOULDER ARTHROSCOPY Left 2005   bone spurs   . SHOULDER ARTHROSCOPY Right 11/01/2017   Procedure: RIGHT SHOULDER ARTHROSCOPY WITH SUBACROMIAL  DECOMPRESSION;  Surgeon: Meredith Pel, MD;  Location: [facility];  Service: Orthopedics;  Laterality: Right;  . SHOULDER ARTHROSCOPY WITH SUBACROMIAL DECOMPRESSION, ROTATOR CUFF REPAIR AND BICEP TENDON REPAIR Right 02/24/2016   Procedure: RIGHT SHOULDER ARTHROSCOPY WITH DEBRIDEMENT, BICEPS TENODESIS;  Surgeon: Meredith Pel, MD;  Location: [facility];  Service: Orthopedics;  Laterality: Right;  . SHOULDER SURGERY Right 2007   Revision and repair flap  . WISDOM TOOTH EXTRACTION    . WRIST ARTHROSCOPY Right 11/09/2017   Procedure: ARTHROSCOPY WRIST WITH DEBRIDEMENT;  Surgeon: Charlotte Crumb, MD;  Location: [facility];  Service: Orthopedics;  Laterality: Right;   Social History   Occupational History  . Not on file  Tobacco Use  . Smoking status: Never Smoker  . Smokeless tobacco: Never Used  Substance and Sexual Activity  . Alcohol use: Yes    Comment: ocassional - social 1- 2 drinks  . Drug use: No  . Sexual activity: Yes    Birth control/protection: I.U.D.

## 2019-01-29 DIAGNOSIS — Z6830 Body mass index (BMI) 30.0-30.9, adult: Secondary | ICD-10-CM | POA: Diagnosis not present

## 2019-01-29 DIAGNOSIS — R790 Abnormal level of blood mineral: Secondary | ICD-10-CM | POA: Diagnosis not present

## 2019-02-05 DIAGNOSIS — R790 Abnormal level of blood mineral: Secondary | ICD-10-CM | POA: Diagnosis not present

## 2019-02-05 DIAGNOSIS — R7989 Other specified abnormal findings of blood chemistry: Secondary | ICD-10-CM | POA: Diagnosis not present

## 2019-02-05 DIAGNOSIS — Z6829 Body mass index (BMI) 29.0-29.9, adult: Secondary | ICD-10-CM | POA: Diagnosis not present

## 2019-02-05 MED FILL — PHENTERMINE HCL 15 MG CAPS: 15 | 14 days supply | Qty: 14 | Fill #0

## 2019-02-13 DIAGNOSIS — R7989 Other specified abnormal findings of blood chemistry: Secondary | ICD-10-CM | POA: Diagnosis not present

## 2019-02-13 DIAGNOSIS — Z6829 Body mass index (BMI) 29.0-29.9, adult: Secondary | ICD-10-CM | POA: Diagnosis not present

## 2019-02-20 ENCOUNTER — Ambulatory Visit (INDEPENDENT_AMBULATORY_CARE_PROVIDER_SITE_OTHER): Payer: Self-pay | Admitting: Physician Assistant

## 2019-02-20 ENCOUNTER — Other Ambulatory Visit: Payer: Self-pay

## 2019-02-20 ENCOUNTER — Telehealth: Payer: 59 | Admitting: Physician Assistant

## 2019-02-20 VITALS — BP 110/70 | HR 79 | Temp 98.6°F | Resp 17 | Wt 186.4 lb

## 2019-02-20 DIAGNOSIS — Z6829 Body mass index (BMI) 29.0-29.9, adult: Secondary | ICD-10-CM | POA: Diagnosis not present

## 2019-02-20 DIAGNOSIS — L03213 Periorbital cellulitis: Secondary | ICD-10-CM

## 2019-02-20 DIAGNOSIS — H00014 Hordeolum externum left upper eyelid: Secondary | ICD-10-CM

## 2019-02-20 DIAGNOSIS — R790 Abnormal level of blood mineral: Secondary | ICD-10-CM | POA: Diagnosis not present

## 2019-02-20 MED ORDER — CEPHALEXIN 500 MG PO CAPS: 500.0000 mg | ORAL_CAPSULE | Freq: Four times a day (QID) | ORAL | 0 refills | Status: AC

## 2019-02-20 MED FILL — PHENTERMINE HCL 15 MG CAPS: 15 | 14 days supply | Qty: 14 | Fill #0

## 2019-02-20 MED FILL — CEPHALEXIN 500 MG CAPSULE: 500 | 10 days supply | Qty: 40 | Fill #0

## 2019-02-20 NOTE — Progress Notes (Signed)
Based on what you shared with me, I feel your condition warrants further evaluation and I recommend that you be seen for a face to face office visit. Moreover, the picture that you have shared with me shows that you may be developing a preseptal cellulitis which can progress to orbital cellulitis, which is a medical emergency. Please seek care ASAP at one of the locations below.      NOTE: If you entered your credit card information for this eVisit, you will not be charged. You may see a "hold" on your card for the $35 but that hold will drop off and you will not have a charge processed.  If you are having a true medical emergency please call 911.  If you need an urgent face to face visit, [facility] has four urgent care centers for your convenience.    PLEASE NOTE: THE [facility] LOCATIONS AND URGENT CARE CLINICS DO NOT HAVE THE TESTING FOR CORONAVIRUS COVID19 AVAILABLE.  IF YOU FEEL YOU NEED THIS TEST YOU MUST GO TO A TRIAGE LOCATION AT [facility] ?  [email or web address] to reserve your spot online an avoid wait times  [facility] [address] [place], [place] [address] Modified hours of operation: Monday-Friday, 10 AM to 6 PM  Saturday & Sunday 10 AM to 4 PM *Across the street from [place] (New Address!) [address], [place], [place] [address] *Just off [address] across the road from [place] hours of operation: Monday-Friday, 10 AM to 5 PM  Closed Saturday & Sunday   The following sites will take your insurance:  [facility] a Provider at this Location  [place], [place] [address] 10 am to 8 pm Monday-Friday 12 pm to 8 pm [facility] a Provider at this Location  [place], [place],  [address] 8 am to 8 pm Monday-Friday 9 am to 6 pm Saturday 11 am to 6 pm Sunday   [facility] at [facility]  [phone] Get Driving Directions  [address] [place], [place] [address] 8 am to 8 pm Monday-Friday 8 am to 4 pm Saturday-Sunday   Your e-visit answers were reviewed by a board certified advanced clinical practitioner to complete your personal care plan.  Thank you for using e-Visits.  ===View-only below this line===   ----- Message -----    From: Jaynie Bream    Sent: 02/20/2019  8:10 AM EDT      To: E-Visit Mailing List Subject: E-Visit Submission: Stye  E-Visit Submission: Stye --------------------------------  Question: Is there a tender bump on your eyelid? Answer:   Yes  Question: Which eye is affected? Answer:   Left upper eyelid  Question: Is it possible to attach a photograph of the affected eye? Answer:   Yes  Question: When did your symptoms start? Answer:   1 week ago  Question: Do you have any pain or tenderness at the site? Answer:   Yes  Question: Please rate your pain on a scale of 1-10 Answer:   7  Question: Do you have blurred vision in the affected eye? Answer:   No  Question: Have you tried any of the following? (Check all that apply) Answer:   Over the counter eye drops  Warm compresses  Question: Comments Answer:   I also have a small one under my right eye as well  Question: Have these interventions helped? Answer:   No  Question: Are you experiencing any unusual tearing of the affected eye? Answer:   Yes  Question: Does your eyelid itch? Answer:   No  Question: Do you have any discharge from the affected eye? Answer:   No  Question: Do you wear contacts? Answer:   No  Question: Are you pregnant? Answer:   I am confident that I am not pregnant  Question: Are you breastfeeding? Answer:   No  Question: Please list your medication  allergies that you may have ? (If 'none' , please list as 'none') Answer:   Red # dye, codine, iv contrast media  Question: Please list any additional comments  Answer:

## 2019-02-20 NOTE — Patient Instructions (Addendum)
Start antibiotic as prescribed. Contact Dr. Zenia Resides office to schedule an appointment for follow up in the next 24-48 hours to ensure this is healing and that you do not need additional antibiotics or I&D.   Contact info for Dr. Katy Fitch:   Dr. Deliah Goody MD [address] [place] [address] [phone]  -Apply warm compress to the affected eye for 10-15 minutes 4-5 times during the day until symptoms improve.  -Avoid aggressively rubbing or cleansing of the eye.  -Use Johnson's baby shampoo to wash the affected area.   If you get any change in vision, fever, increase in pain, decreased eye movement, or other concerning symptoms, go immediatly to ED.    Preseptal Cellulitis, Adult  Preseptal cellulitis is an infection of the eyelid and the tissues around the eye (periorbital area). The infection causes painful swelling and redness. This condition may also be called periorbital cellulitis. In most cases, the condition can be treated with antibiotic medicine at home. It is important to treat preseptal cellulitis right away so that it does not get worse. If it gets worse, it can spread to the eye socket and eye muscles (orbital cellulitis). Orbital cellulitis is a medical emergency. What are the causes? Preseptal cellulitis is most commonly caused by bacteria. In rare cases, it can be caused by a virus or fungus. The germs that cause preseptal cellulitis may come from:  A sinus infection that spreads near the eyes.  An injury near the eye, such as a scratch, animal bite, or insect bite.  A skin rash that becomes infected, such as eczema or poison ivy.  An infected pimple on the eyelid (stye).  Infection after eyelid surgery or injury. What increases the risk? You are more likely to develop this condition if:  You have a weakened disease-fighting system (immune system).  You have a medical condition that raises your risk for sinus infections, such as nasal polyps. What are the signs or  symptoms? Symptoms of this condition usually develop suddenly. Symptoms may include:  Eyelids that are red and swollen and feel unusually hot.  Fever.  Difficulty opening the eye.  Headache.  Facial pain. How is this diagnosed? This condition may be diagnosed based on your symptoms, your medical history, and an eye exam. You may have tests, such as:  Blood tests.  CT scan.  MRI. How is this treated? This condition is treated with antibiotic medicines. These may be given by mouth (orally), through an IV, or as a shot. In rare cases, you may need surgery to drain an infected area. Follow these instructions at home: Medicines  If you were prescribed an antibiotic to take at home, take it as told by your health care provider. Do not stop taking the antibiotic even if you start to feel better.  Take over-the-counter and prescription medicines only as told by your health care provider. Eye Care  Do not use eye drops without first getting approval from your health care provider.  Do not touch or rub your eye. If you wear contact lenses, do not wear them until your health care provider approves.  Keep the eye area clean and dry.  Wash the eye area with a clean washcloth, warm water, and baby shampoo or mild soap.  To help relieve discomfort, place a clean washcloth that is wet with warm water over your eye. Leave the washcloth on for a few minutes, then remove it. General instructions  Wash your hands with soap and water often.  If soap and water are not available, use hand sanitizer.  Do not use any products that contain nicotine or tobacco, such as cigarettes and e-cigarettes. If you need help quitting, ask your health care provider.  Drink enough fluid to keep your urine pale yellow.  Ask your health care provider if it is safe for you to drive.  Stay up to date on your vaccinations.  Keep all follow-up visits as told by your health care provider. This includes any visits  with an eye specialist (ophthalmologist) or dentist. This is important. Get help right away if:  You have new symptoms.  Your symptoms get worse or do not get better with treatment.  You have a fever.  Your vision becomes blurry or gets worse in any way.  Your eye looks like it is sticking out or bulging out (proptosis).  You have trouble moving your eyes.  You have a severe headache.  You have neck stiffness or severe neck pain. Summary  Preseptal cellulitis is an infection of the eyelid and the tissues around the eye.  Symptoms of preseptal cellulitis usually develop suddenly and include red and swollen eyelids, fever, difficulty opening the eye, headache, and facial pain.  This condition is treated with antibiotic medicines. Do not stop taking the antibiotic even if you start to feel better. This information is not intended to replace advice given to you by your health care provider. Make sure you discuss any questions you have with your health care provider. Document Released: 12/04/2010 Document Revised: 08/24/2017 Document Reviewed: 08/24/2017 Elsevier Interactive Patient Education  2019 [place], also known as a hordeolum, is a bump that forms on an eyelid. It may look like a pimple next to the eyelash. A stye can form inside the eyelid (internal stye) or outside the eyelid (external stye). A stye can cause redness, swelling, and pain on the eyelid. Styes are very common. Anyone can get them at any age. They usually occur in just one eye, but you may have more than one in either eye. What are the causes? A stye is caused by an infection. The infection is almost always caused by bacteria called Staphylococcus aureus. This is a common type of bacteria that lives on the skin. An internal stye may result from an infected oil-producing gland inside the eyelid. An external stye may be caused by an infection at the base of the eyelash (hair follicle). What  increases the risk? You are more likely to develop a stye if:  You have had a stye before.  You have any of these conditions: ? Diabetes. ? Red, itchy, inflamed eyelids (blepharitis). ? A skin condition such as seborrheic dermatitis or rosacea. ? High fat levels in your blood (lipids). What are the signs or symptoms? The most common symptom of a stye is eyelid pain. Internal styes are more painful than external styes. Other symptoms may include:  Painful swelling of your eyelid.  A scratchy feeling in your eye.  Tearing and redness of your eye.  Pus draining from the stye. How is this diagnosed? Your health care provider may be able to diagnose a stye just by examining your eye. The health care provider may also check to make sure:  You do not have a fever or other signs of a more serious infection.  The infection has not spread to other parts of your eye or areas around your eye. How is this treated? Most styes will clear  up in a few days without treatment or with warm compresses applied to the area. You may need to use antibiotic drops or ointment to treat an infection. In some cases, if your stye does not heal with routine treatment, your health care provider may drain pus from the stye using a thin blade or needle. This may be done if the stye is large, causing a lot of pain, or affecting your vision. Follow these instructions at home:  Take over-the-counter and prescription medicines only as told by your health care provider. This includes eye drops or ointments.  If you were prescribed an antibiotic medicine, apply or use it as told by your health care provider. Do not stop using the antibiotic even if your condition improves.  Apply a warm, wet cloth (warm compress) to your eye for 5-10 minutes, 4 times a day.  Clean the affected eyelid as directed by your health care provider.  Do not wear contact lenses or eye makeup until your stye has healed.  Do not try to pop or  drain the stye.  Do not rub your eye. Contact a health care provider if:  You have chills or a fever.  Your stye does not go away after several days.  Your stye affects your vision.  Your eyeball becomes swollen, red, or painful. Get help right away if:  You have pain when moving your eye around. Summary  A stye is a bump that forms on an eyelid. It may look like a pimple next to the eyelash.  A stye can form inside the eyelid (internal stye) or outside the eyelid (external stye). A stye can cause redness, swelling, and pain on the eyelid.  Your health care provider may be able to diagnose a stye just by examining your eye.  Apply a warm, wet cloth (warm compress) to your eye for 5-10 minutes, 4 times a day. This information is not intended to replace advice given to you by your health care provider. Make sure you discuss any questions you have with your health care provider. Document Released: 08/11/2005 Document Revised: 07/14/2017 Document Reviewed: 07/14/2017 Elsevier Interactive Patient Education  2019 [employer].

## 2019-02-20 NOTE — Progress Notes (Signed)
Emma Mathews  MRN: [id number] DOB: May 29, 1976  Subjective:  Emma Mathews is a 43 y.o. female seen in office today for a chief complaint of stye. Actually had one on right lower eyelid 2 weeks ago, improved. Then developed one on left upper eyelid 4 days ago and it is worsening. Now has worsening redness and swelling. Has pain over the stye. Denies fever, inability to move eye, eye pain, photophobia, eye redness, eye discharge, visual disturbance, and foreign body sensation. Denies recent infection or eye trauma. Does not wear contact lens. Has tried warm compression 4x a day and aleve with no full relief. Denies hx of DM, MRSA, or immunosuppression. Denies smoking.  She was going to make an appointment with ophthalmology but they are only seeing emergencies right now so came here.  Review of Systems  Constitutional: Negative for chills and diaphoresis.  Eyes: Negative for redness and itching.  Neurological: Negative for dizziness and headaches.    Patient Active Problem List   Diagnosis Date Noted  . Chronic migraine with aura 03/27/2018  . Obesity 02/01/2018  . Headache 02/01/2018  . Motion sickness 02/01/2018  . History of migraine 02/01/2018  . 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 ADD 10/28/2009  . ANEMIA-UNSPECIFIED 04/28/2009  . ALLERGIC RHINITIS 02/25/2009    Current Outpatient Medications on File Prior to Visit  Medication Sig Dispense Refill  . levonorgestrel (MIRENA, 52 MG,) 20 MCG/24HR IUD Mirena 20 mcg/24 hours (5 yrs) 52 mg intrauterine device    . levonorgestrel (MIRENA, 52 MG,) 20 MCG/24HR IUD Mirena 20 mcg/24 hours (5 yrs) 52 mg intrauterine device    . mometasone (NASONEX) 50 MCG/ACT nasal spray Place into the nose.    . mometasone (NASONEX) 50 MCG/ACT nasal spray Nasonex    . Multiple Vitamin (MULTIVITAMIN WITH MINERALS) TABS tablet Take 1 tablet by mouth at bedtime.    . Multiple Vitamins-Minerals (MULTIPLE VITAMINS/WOMENS PO) Multiple Vitamin,  Womens    . naproxen sodium (ALEVE) 220 MG tablet Take 220 mg by mouth.    . ondansetron (ZOFRAN ODT) 4 MG disintegrating tablet Take 1 tablet (4 mg total) by mouth every 8 (eight) hours as needed. 20 tablet 6  . rizatriptan (MAXALT-MLT) 10 MG disintegrating tablet Take 1 tablet (10 mg total) by mouth as needed. May repeat in 2 hours if needed 15 tablet 6  . rizatriptan (MAXALT-MLT) 5 MG disintegrating tablet Maxalt-MLT 5 mg disintegrating tablet   1 tablet as needed by oral route.    . SUMAtriptan 6 MG/0.5ML SOAJ Inject 6mg  at onset of migraine.  May repeat in 2 hrs, if needed.  Max dose: 2 injections/day. This is a 30 day prescription. 10 Cartridge 5  . triamcinolone cream (KENALOG) 0.5 % Apply 1 application topically 3 (three) times daily. (Patient not taking: Reported on 02/20/2019) 30 g 0   No current facility-administered medications on file prior to visit.     Allergies  Allergen Reactions  . Diphenhydramine Hcl Hives and Other (See Comments)    Pt states she can take Dye free Benadryl  . Diphenhydramine Hcl Other (See Comments) and Hives    Pt states she can take Dye free Benadryl  . Iodinated Diagnostic Agents Shortness Of Breath, Nausea And Vomiting, Other (See Comments) and Anaphylaxis    Shortness of breath  . Chlorhexidine Gluconate Hives  . Codeine Hives, Nausea And Vomiting and Rash     Objective:  BP 110/70 (BP Location: Right Arm, Patient Position: Sitting, Cuff Size: Normal)  Pulse 79   Temp 98.6 F (37 C) (Oral)   Resp 17   Wt 186 lb 6.4 oz (84.6 kg)   SpO2 98%   BMI 30.09 kg/m   Physical Exam Vitals signs reviewed.  Constitutional:      General: She is not in acute distress.    Appearance: She is well-developed. She is not ill-appearing or toxic-appearing.  HENT:     Head: Normocephalic and atraumatic. Left periorbital erythema present.  Eyes:     General: No visual field deficit.       Right eye: No discharge.        Left eye: Hordeolum (pinpoint  hordeolum noted on lateral aspect of left upper eyelid, surrounding erythema, edema, and warmth extending to medial aspect of eyelid, pinpoint TTP overlying hordeolum, no TTP of orbital region, see image below) present.No discharge.     Extraocular Movements: Extraocular movements intact.     Conjunctiva/sclera: Conjunctivae normal.     Right eye: Right conjunctiva is not injected. No chemosis.    Left eye: Left conjunctiva is not injected. No chemosis.    Pupils: Pupils are equal, round, and reactive to light.     Visual Fields: Left eye visual fields normal.     Comments: No proptosis noted.   Neck:     Musculoskeletal: Normal range of motion.  Pulmonary:     Effort: Pulmonary effort is normal.  Skin:    General: Skin is warm and dry.  Neurological:     Mental Status: She is alert and oriented to person, place, and time.      Visual Acuity Screening   Right eye Left eye Both eyes  Without correction: 20/16 20/16 20/16   With correction:          Assessment and Plan :  1. Preseptal cellulitis of left upper eyelid 2. Hordeolum externum of left upper eyelid She is well appearing, NAD. VSS. There is an obvious hordeolum noted on left upper eyelid, but worsening eyelid erythema and edema concerning for early preseptal cellulitis. No concern for orbital cellulitis. Pt is afebrile. No ophthalmoplegia, pain with eye movements, chemosis, or proptosis.  Normal visual acuity.  Contacted Dr. Zenia Resides ophthalmology office and discussed case with Dr. Zenia Resides physician assistant, Gwenlyn Perking. He recommended starting pt on keflex 500mg  QID x 10 days and having her f/u with their office in 24-48 hours. Notes he will determine at f/u visit if 2nd abx agent should be added vs I&D.  Advised pt to seek care immediately at ED if she develops any visual disturbance, fever, eye pain, inability to move eyes, or other concerning symptoms. Pt voices her understanding and agrees with plan.  - cephALEXin (KEFLEX) 500  MG capsule; Take 1 capsule (500 mg total) by mouth 4 (four) times daily for 10 days.  Dispense: 40 capsule; Refill: 0   Emma Mathews, [facility] 02/20/2019 1:58 PM

## 2019-02-21 DIAGNOSIS — H00024 Hordeolum internum left upper eyelid: Secondary | ICD-10-CM | POA: Diagnosis not present

## 2019-02-22 ENCOUNTER — Telehealth: Payer: Self-pay

## 2019-02-22 NOTE — Telephone Encounter (Signed)
Patient states she was evaluated by the ophthalmologist and he agreed with the antibiotic prescribed   By the provider at [facility], patient have  follow up with the Ophthalmologist in 2 weeks.

## 2019-02-22 NOTE — Telephone Encounter (Signed)
Patient did not answered the phone 

## 2019-02-27 DIAGNOSIS — Z6829 Body mass index (BMI) 29.0-29.9, adult: Secondary | ICD-10-CM | POA: Diagnosis not present

## 2019-02-27 DIAGNOSIS — R7989 Other specified abnormal findings of blood chemistry: Secondary | ICD-10-CM | POA: Diagnosis not present

## 2019-03-06 DIAGNOSIS — R790 Abnormal level of blood mineral: Secondary | ICD-10-CM | POA: Diagnosis not present

## 2019-03-06 DIAGNOSIS — Z6829 Body mass index (BMI) 29.0-29.9, adult: Secondary | ICD-10-CM | POA: Diagnosis not present

## 2019-03-07 DIAGNOSIS — H00024 Hordeolum internum left upper eyelid: Secondary | ICD-10-CM | POA: Diagnosis not present

## 2019-03-14 DIAGNOSIS — Z6829 Body mass index (BMI) 29.0-29.9, adult: Secondary | ICD-10-CM | POA: Diagnosis not present

## 2019-03-14 DIAGNOSIS — R7989 Other specified abnormal findings of blood chemistry: Secondary | ICD-10-CM | POA: Diagnosis not present

## 2019-03-20 DIAGNOSIS — Z6830 Body mass index (BMI) 30.0-30.9, adult: Secondary | ICD-10-CM | POA: Diagnosis not present

## 2019-03-20 DIAGNOSIS — R635 Abnormal weight gain: Secondary | ICD-10-CM | POA: Diagnosis not present

## 2019-03-20 DIAGNOSIS — R790 Abnormal level of blood mineral: Secondary | ICD-10-CM | POA: Diagnosis not present

## 2019-03-20 MED FILL — PHENTERMINE 15 MG CAPSULE: 15 | 14 days supply | Qty: 14 | Fill #0

## 2019-03-27 DIAGNOSIS — R7989 Other specified abnormal findings of blood chemistry: Secondary | ICD-10-CM | POA: Diagnosis not present

## 2019-03-27 DIAGNOSIS — Z6829 Body mass index (BMI) 29.0-29.9, adult: Secondary | ICD-10-CM | POA: Diagnosis not present

## 2019-04-03 DIAGNOSIS — R7989 Other specified abnormal findings of blood chemistry: Secondary | ICD-10-CM | POA: Diagnosis not present

## 2019-04-03 DIAGNOSIS — R790 Abnormal level of blood mineral: Secondary | ICD-10-CM | POA: Diagnosis not present

## 2019-04-03 DIAGNOSIS — Z6829 Body mass index (BMI) 29.0-29.9, adult: Secondary | ICD-10-CM | POA: Diagnosis not present

## 2019-04-24 ENCOUNTER — Encounter: Payer: Self-pay | Admitting: Podiatry

## 2019-04-24 ENCOUNTER — Ambulatory Visit (INDEPENDENT_AMBULATORY_CARE_PROVIDER_SITE_OTHER): Payer: 59 | Admitting: Podiatry

## 2019-04-24 ENCOUNTER — Ambulatory Visit (INDEPENDENT_AMBULATORY_CARE_PROVIDER_SITE_OTHER): Payer: 59

## 2019-04-24 ENCOUNTER — Other Ambulatory Visit: Payer: Self-pay | Admitting: Podiatry

## 2019-04-24 ENCOUNTER — Other Ambulatory Visit: Payer: Self-pay

## 2019-04-24 DIAGNOSIS — M79672 Pain in left foot: Secondary | ICD-10-CM

## 2019-04-24 DIAGNOSIS — M722 Plantar fascial fibromatosis: Secondary | ICD-10-CM | POA: Diagnosis not present

## 2019-04-24 DIAGNOSIS — M79671 Pain in right foot: Secondary | ICD-10-CM

## 2019-04-24 MED ORDER — TRIAMCINOLONE ACETONIDE 10 MG/ML IJ SUSP
10.0000 mg | Freq: Once | INTRAMUSCULAR | Status: AC
  Administered 2019-04-24: 10 mg

## 2019-04-24 MED ORDER — MELOXICAM 15 MG PO TABS: 15.0000 mg | ORAL_TABLET | Freq: Every day | ORAL | 0 refills | Status: DC

## 2019-04-24 MED FILL — MELOXICAM 15 MG TABLET: 15 | 30 days supply | Qty: 30 | Fill #0

## 2019-04-24 NOTE — Progress Notes (Signed)
Subjective:   Patient ID: Emma Mathews, female   DOB: 43 y.o.   MRN: [id number]   HPI 43 year old female presents the office today for concerns of left heel pain.  This is been ongoing since middle of February 2020.  She states that she has been exercising a lot over the last year and she is lost 37 pounds by the time of onset of her heel pain did not have any change in activity or increase activity level.  She previously had 1 injection performed as well as stretching exercises.  She also has other treatments at home she is been doing including going orthotics on her foot.  She said the injection helped proximal weeks and the pain came right back.  She denies any recent injury or trauma.  No numbness or tingling.  The pain does not wake her up at night.  No swelling.  No other concerns.  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  Past Medical History:  Diagnosis Date  . Appendicitis, acute 10/02/2013  . Cancer ([place])    melanoma excision in 2013  . Cholecystitis   . Complication of anesthesia    slow to wake up  . Headache    migraine  . Mammographic breast lesion 06/28/2017   S/p biopsy, benign   . Pneumonia 2010  . PONV (postoperative nausea and vomiting)   . Seasonal allergies   . STRESS FRACTURE, FOOT 04/08/2009   Qualifier: Diagnosis of  By: Arnoldo Morale MD, Balinda Quails Vision abnormalities     Past Surgical History:  Procedure Laterality Date  . APPENDECTOMY    . CHOLECYSTECTOMY    . EYE SURGERY Bilateral    PRK  . Labrum Repair Right 2000   Open  . LAPAROSCOPIC APPENDECTOMY N/A 09/02/2013   Procedure: APPENDECTOMY LAPAROSCOPIC;  Surgeon: Harl Bowie, MD;  Location: [facility];  Service: General;  Laterality: N/A;  . SHOULDER ARTHROSCOPY Left 2005   bone spurs   . SHOULDER ARTHROSCOPY Right 11/01/2017   Procedure: RIGHT SHOULDER ARTHROSCOPY WITH SUBACROMIAL DECOMPRESSION;  Surgeon: Meredith Pel,  MD;  Location: [facility];  Service: Orthopedics;  Laterality: Right;  . SHOULDER ARTHROSCOPY WITH SUBACROMIAL DECOMPRESSION, ROTATOR CUFF REPAIR AND BICEP TENDON REPAIR Right 02/24/2016   Procedure: RIGHT SHOULDER ARTHROSCOPY WITH DEBRIDEMENT, BICEPS TENODESIS;  Surgeon: Meredith Pel, MD;  Location: [facility];  Service: Orthopedics;  Laterality: Right;  . SHOULDER SURGERY Right 2007   Revision and repair flap  . WISDOM TOOTH EXTRACTION    . WRIST ARTHROSCOPY Right 11/09/2017   Procedure: ARTHROSCOPY WRIST WITH DEBRIDEMENT;  Surgeon: Charlotte Crumb, MD;  Location: [facility];  Service: Orthopedics;  Laterality: Right;     Current Outpatient Medications:  .  levonorgestrel (MIRENA, 52 MG,) 20 MCG/24HR IUD, Mirena 20 mcg/24 hours (5 yrs) 52 mg intrauterine device, Disp: , Rfl:  .  meloxicam (MOBIC) 15 MG tablet, Take 1 tablet (15 mg total) by mouth daily., Disp: 30 tablet, Rfl: 0 .  mometasone (NASONEX) 50 MCG/ACT nasal spray, Nasonex, Disp: , Rfl:  .  Multiple Vitamins-Minerals (MULTIPLE VITAMINS/WOMENS PO), Multiple Vitamin, Womens, Disp: , Rfl:  .  ondansetron (ZOFRAN ODT) 4 MG disintegrating tablet, Take 1 tablet (4 mg total) by mouth every 8 (eight) hours as needed., Disp: 20 tablet, Rfl: 6 .  promethazine (PHENERGAN) 12.5 MG tablet, promethazine 12.5 mg tablet, Disp: , Rfl:  .  rizatriptan (MAXALT-MLT)  10 MG disintegrating tablet, Take 1 tablet (10 mg total) by mouth as needed. May repeat in 2 hours if needed, Disp: 15 tablet, Rfl: 6 .  SUMAtriptan 6 MG/0.5ML SOAJ, Inject 6mg  at onset of migraine.  May repeat in 2 hrs, if needed.  Max dose: 2 injections/day. This is a 30 day prescription., Disp: 10 Cartridge, Rfl: 5  Allergies  Allergen Reactions  . Diphenhydramine Hcl Hives and Other (See Comments)    Pt states she can take Dye free Benadryl  . Diphenhydramine Hcl Other (See Comments) and Hives    Pt states she can take Dye free Benadryl  . Iodinated Diagnostic Agents  Shortness Of Breath, Nausea And Vomiting, Other (See Comments) and Anaphylaxis    Shortness of breath  . Chlorhexidine Gluconate Hives  . Codeine Hives, Nausea And Vomiting and Rash         Objective:  Physical Exam  General: AAO x3, NAD  Dermatological: Skin is warm, dry and supple bilateral. Nails x 10 are well manicured; remaining integument appears unremarkable at this time. There are no open sores, no preulcerative lesions, no rash or signs of infection present.  Vascular: Dorsalis Pedis artery and Posterior Tibial artery pedal pulses are 2/4 bilateral with immedate capillary fill time. Pedal hair growth present. No varicosities and no lower extremity edema present bilateral. There is no pain with calf compression, swelling, warmth, erythema.   Neruologic: Grossly intact via light touch bilateral.  Protective threshold with Semmes Wienstein monofilament intact to all pedal sites bilateral. Negative tinel sign.   Musculoskeletal: Tenderness to palpation along the plantar medial tubercle of the calcaneus at the insertion of plantar fascia on the left foot. There is no pain along the course of the plantar fascia within the arch of the foot. Plantar fascia appears to be intact. There is no pain with lateral compression of the calcaneus or pain with vibratory sensation. There is no pain along the course or insertion of the achilles tendon. No other areas of tenderness to bilateral lower extremities. Muscular strength 5/5 in all groups tested bilateral.  Gait: Unassisted, Nonantalgic.       Assessment:   Left foot heel pain, plantar fasciitis     Plan:  -Treatment options discussed including all alternatives, risks, and complications -Etiology of symptoms were discussed -Steroid injection performed.  See procedure note below. -Prescribed mobic. Discussed side effects of the medication and directed to stop if any are to occur and call the office.  -Night splint dispensed. -Plantar  fascial taping applied. -Discussion modifications and orthotics. -Stretching, icing daily.  Procedure: Injection Tendon/Ligament Discussed alternatives, risks, complications and verbal consent was obtained.  Location: Right plantar fascia at the glabrous junction; medial approach. Skin Prep: Alcohol Injectate: 0.5cc 0.5% marcaine plain, 0.5 cc 2% lidocaine plain and, 1 cc kenalog 10. Disposition: Patient tolerated procedure well. Injection site dressed with a band-aid.  Post-injection care was discussed and return precautions discussed.   Trula Slade DPM

## 2019-04-24 NOTE — Patient Instructions (Signed)

## 2019-05-08 ENCOUNTER — Other Ambulatory Visit: Payer: Self-pay

## 2019-05-08 ENCOUNTER — Encounter: Payer: Self-pay | Admitting: Podiatry

## 2019-05-08 ENCOUNTER — Ambulatory Visit (INDEPENDENT_AMBULATORY_CARE_PROVIDER_SITE_OTHER): Payer: 59 | Admitting: Podiatry

## 2019-05-08 VITALS — Temp 97.7°F

## 2019-05-08 DIAGNOSIS — M722 Plantar fascial fibromatosis: Secondary | ICD-10-CM | POA: Diagnosis not present

## 2019-05-08 MED ORDER — TRIAMCINOLONE ACETONIDE 10 MG/ML IJ SUSP
10.0000 mg | Freq: Once | INTRAMUSCULAR | Status: AC
  Administered 2019-05-08: 10 mg

## 2019-05-08 MED ORDER — METHYLPREDNISOLONE 4 MG PO TBPK: ORAL_TABLET | ORAL | 0 refills | Status: DC

## 2019-05-08 NOTE — Patient Instructions (Signed)
Hold off on taking the mobic while on the steroid    Plantar Fasciitis (Heel Spur Syndrome) with Rehab The plantar fascia is a fibrous, ligament-like, soft-tissue structure that spans the bottom of the foot. Plantar fasciitis is a condition that causes pain in the foot due to inflammation of the tissue. SYMPTOMS   Pain and tenderness on the underneath side of the foot.  Pain that worsens with standing or walking. CAUSES  Plantar fasciitis is caused by irritation and injury to the plantar fascia on the underneath side of the foot. Common mechanisms of injury include:  Direct trauma to bottom of the foot.  Damage to a small nerve that runs under the foot where the main fascia attaches to the heel bone.  Stress placed on the plantar fascia due to bone spurs. RISK INCREASES WITH:   Activities that place stress on the plantar fascia (running, jumping, pivoting, or cutting).  Poor strength and flexibility.  Improperly fitted shoes.  Tight calf muscles.  Flat feet.  Failure to warm-up properly before activity.  Obesity. PREVENTION  Warm up and stretch properly before activity.  Allow for adequate recovery between workouts.  Maintain physical fitness:  Strength, flexibility, and endurance.  Cardiovascular fitness.  Maintain a health body weight.  Avoid stress on the plantar fascia.  Wear properly fitted shoes, including arch supports for individuals who have flat feet.  PROGNOSIS  If treated properly, then the symptoms of plantar fasciitis usually resolve without surgery. However, occasionally surgery is necessary.  RELATED COMPLICATIONS   Recurrent symptoms that may result in a chronic condition.  Problems of the lower back that are caused by compensating for the injury, such as limping.  Pain or weakness of the foot during push-off following surgery.  Chronic inflammation, scarring, and partial or complete fascia tear, occurring more often from repeated  injections.  TREATMENT  Treatment initially involves the use of ice and medication to help reduce pain and inflammation. The use of strengthening and stretching exercises may help reduce pain with activity, especially stretches of the Achilles tendon. These exercises may be performed at home or with a therapist. Your caregiver may recommend that you use heel cups of arch supports to help reduce stress on the plantar fascia. Occasionally, corticosteroid injections are given to reduce inflammation. If symptoms persist for greater than 6 months despite non-surgical (conservative), then surgery may be recommended.   MEDICATION   If pain medication is necessary, then nonsteroidal anti-inflammatory medications, such as aspirin and ibuprofen, or other minor pain relievers, such as acetaminophen, are often recommended.  Do not take pain medication within 7 days before surgery.  Prescription pain relievers may be given if deemed necessary by your caregiver. Use only as directed and only as much as you need.  Corticosteroid injections may be given by your caregiver. These injections should be reserved for the most serious cases, because they may only be given a certain number of times.  HEAT AND COLD  Cold treatment (icing) relieves pain and reduces inflammation. Cold treatment should be applied for 10 to 15 minutes every 2 to 3 hours for inflammation and pain and immediately after any activity that aggravates your symptoms. Use ice packs or massage the area with a piece of ice (ice massage).  Heat treatment may be used prior to performing the stretching and strengthening activities prescribed by your caregiver, physical therapist, or athletic trainer. Use a heat pack or soak the injury in warm water.  SEEK IMMEDIATE MEDICAL CARE IF:  Treatment seems to offer no benefit, or the condition worsens.  Any medications produce adverse side effects.  EXERCISES- RANGE OF MOTION (ROM) AND STRETCHING  EXERCISES - Plantar Fasciitis (Heel Spur Syndrome) These exercises may help you when beginning to rehabilitate your injury. Your symptoms may resolve with or without further involvement from your physician, physical therapist or athletic trainer. While completing these exercises, remember:   Restoring tissue flexibility helps normal motion to return to the joints. This allows healthier, less painful movement and activity.  An effective stretch should be held for at least 30 seconds.  A stretch should never be painful. You should only feel a gentle lengthening or release in the stretched tissue.  RANGE OF MOTION - Toe Extension, Flexion  Sit with your right / left leg crossed over your opposite knee.  Grasp your toes and gently pull them back toward the top of your foot. You should feel a stretch on the bottom of your toes and/or foot.  Hold this stretch for 10 seconds.  Now, gently pull your toes toward the bottom of your foot. You should feel a stretch on the top of your toes and or foot.  Hold this stretch for 10 seconds. Repeat  times. Complete this stretch 3 times per day.   RANGE OF MOTION - Ankle Dorsiflexion, Active Assisted  Remove shoes and sit on a chair that is preferably not on a carpeted surface.  Place right / left foot under knee. Extend your opposite leg for support.  Keeping your heel down, slide your right / left foot back toward the chair until you feel a stretch at your ankle or calf. If you do not feel a stretch, slide your bottom forward to the edge of the chair, while still keeping your heel down.  Hold this stretch for 10 seconds. Repeat 3 times. Complete this stretch 2 times per day.   STRETCH  Gastroc, Standing  Place hands on wall.  Extend right / left leg, keeping the front knee somewhat bent.  Slightly point your toes inward on your back foot.  Keeping your right / left heel on the floor and your knee straight, shift your weight toward the wall,  not allowing your back to arch.  You should feel a gentle stretch in the right / left calf. Hold this position for 10 seconds. Repeat 3 times. Complete this stretch 2 times per day.  STRETCH  Soleus, Standing  Place hands on wall.  Extend right / left leg, keeping the other knee somewhat bent.  Slightly point your toes inward on your back foot.  Keep your right / left heel on the floor, bend your back knee, and slightly shift your weight over the back leg so that you feel a gentle stretch deep in your back calf.  Hold this position for 10 seconds. Repeat 3 times. Complete this stretch 2 times per day.  STRETCH  Gastrocsoleus, Standing  Note: This exercise can place a lot of stress on your foot and ankle. Please complete this exercise only if specifically instructed by your caregiver.   Place the ball of your right / left foot on a step, keeping your other foot firmly on the same step.  Hold on to the wall or a rail for balance.  Slowly lift your other foot, allowing your body weight to press your heel down over the edge of the step.  You should feel a stretch in your right / left calf.  Hold this position for 10  seconds.  Repeat this exercise with a slight bend in your right / left knee. Repeat 3 times. Complete this stretch 2 times per day.   STRENGTHENING EXERCISES - Plantar Fasciitis (Heel Spur Syndrome)  These exercises may help you when beginning to rehabilitate your injury. They may resolve your symptoms with or without further involvement from your physician, physical therapist or athletic trainer. While completing these exercises, remember:   Muscles can gain both the endurance and the strength needed for everyday activities through controlled exercises.  Complete these exercises as instructed by your physician, physical therapist or athletic trainer. Progress the resistance and repetitions only as guided.  STRENGTH - Towel Curls  Sit in a chair positioned on a  non-carpeted surface.  Place your foot on a towel, keeping your heel on the floor.  Pull the towel toward your heel by only curling your toes. Keep your heel on the floor. Repeat 3 times. Complete this exercise 2 times per day.  STRENGTH - Ankle Inversion  Secure one end of a rubber exercise band/tubing to a fixed object (table, pole). Loop the other end around your foot just before your toes.  Place your fists between your knees. This will focus your strengthening at your ankle.  Slowly, pull your big toe up and in, making sure the band/tubing is positioned to resist the entire motion.  Hold this position for 10 seconds.  Have your muscles resist the band/tubing as it slowly pulls your foot back to the starting position. Repeat 3 times. Complete this exercises 2 times per day.  Document Released: 11/01/2005 Document Revised: 01/24/2012 Document Reviewed: 02/13/2009 [facility] Patient Information 2014 [place], [place].

## 2019-05-09 DIAGNOSIS — D1801 Hemangioma of skin and subcutaneous tissue: Secondary | ICD-10-CM | POA: Diagnosis not present

## 2019-05-09 DIAGNOSIS — D225 Melanocytic nevi of trunk: Secondary | ICD-10-CM | POA: Diagnosis not present

## 2019-05-09 DIAGNOSIS — L821 Other seborrheic keratosis: Secondary | ICD-10-CM | POA: Diagnosis not present

## 2019-05-09 DIAGNOSIS — L910 Hypertrophic scar: Secondary | ICD-10-CM | POA: Diagnosis not present

## 2019-05-09 DIAGNOSIS — Z872 Personal history of diseases of the skin and subcutaneous tissue: Secondary | ICD-10-CM | POA: Diagnosis not present

## 2019-05-15 NOTE — Progress Notes (Signed)
Subjective: 43 year old female presents the office today for follow-up evaluation of bilateral plantar fasciitis.  She states that overall she is doing better the taping has helped.  She states it started come back.  She also was helpful.  She states that she went for walk about 1 mile and she had turned around because of the discomfort in the heel started.  She underwent a night splint.  She states that she can tell a difference if she does not wear it.  No other changes. Denies any systemic complaints such as fevers, chills, nausea, vomiting. No acute changes since last appointment, and no other complaints at this time.   Objective: AAO x3, NAD DP/PT pulses palpable bilaterally, CRT less than 3 seconds there is still tenderness palpation on the plantar medial tubercle of the calcaneus at the insertion of plantar fascia bilaterally.  No pain in the course of the plantar fascial.  Achilles tendon intact.  No pain with lateral compression calcaneus.  No edema, erythema..  No open lesions or pre-ulcerative lesions.  No pain with calf compression, swelling, warmth, erythema  Assessment: Bilateral heel pain, plantar fasciitis  Plan: -All treatment options discussed with the patient including all alternatives, risks, complications.  -Steroid injections completed, see procedure note below. -Plantar fascial taping -Continue stretching, icing daily. -Discussed shoe modifications and orthotics -Patient encouraged to call the office with any questions, concerns, change in symptoms.   Procedure: Injection Tendon/Ligament Discussed alternatives, risks, complications and verbal consent was obtained.  Location: plantar fascia at the glabrous junction; medial approach. Skin Prep: Alcohol Injectate: 0.5cc 0.5% marcaine plain, 0.5 cc 2% lidocaine plain and, 1 cc kenalog 10. Disposition: Patient tolerated procedure well. Injection site dressed with a band-aid.  Post-injection care was discussed and return  precautions discussed.   Trula Slade DPM

## 2019-06-05 ENCOUNTER — Encounter: Payer: Self-pay | Admitting: Podiatry

## 2019-06-05 ENCOUNTER — Other Ambulatory Visit: Payer: Self-pay

## 2019-06-05 ENCOUNTER — Ambulatory Visit (INDEPENDENT_AMBULATORY_CARE_PROVIDER_SITE_OTHER): Payer: 59 | Admitting: Podiatry

## 2019-06-05 VITALS — Temp 98.1°F

## 2019-06-05 DIAGNOSIS — M722 Plantar fascial fibromatosis: Secondary | ICD-10-CM | POA: Insufficient documentation

## 2019-06-05 HISTORY — DX: Plantar fascial fibromatosis: M72.2

## 2019-06-05 MED ORDER — MELOXICAM 15 MG PO TABS: 15.0000 mg | ORAL_TABLET | Freq: Every day | ORAL | 0 refills | Status: AC

## 2019-06-05 MED FILL — MELOXICAM 15 MG TABLET: 15 | 30 days supply | Qty: 30 | Fill #0

## 2019-06-05 NOTE — Progress Notes (Signed)
Subjective:  43 year old female presents the office today for evaluation of left heel pain, plantar fasciitis.  She states that overall she is doing better.  She has some mild increasing pain today and she claudication of the feet and was seen in did aggravate her foot some.  She is been doing much stretching, has not actually been going to [facility] for dry needling. She states that the second injection helped more.  The taping was also helpful.  No acute office will squeeze her treatment.  This is been helpful as well. Denies any systemic complaints such as fevers, chills, nausea, vomiting. No acute changes since last appointment, and no other complaints at this time.   Objective: AAO x3, NAD DP/PT pulses palpable bilaterally, CRT less than 3 seconds There is mild tenderness palpation of the plantar medial tubercle of the calcaneus and insertion plantar fascia.  Plantar fascial appears to be intact.  No heel cords were insertional Achilles tendon.  No erythema or tenderness identified today. No open lesions or pre-ulcerative lesions.  No pain with calf compression, swelling, warmth, erythema  Assessment: Heel pain component fasciitis  Plan: -All treatment options discussed with the patient including all alternatives, risks, complications.  -Overall she is doing much better.  Hold off on the third injection today as her pain is much improved. Continue stretching, icing daily.  Plantar fascial tape is applied.  Refill meloxicam.  Continue physical therapy.  Follow-up in 4 to 6 weeks. -Patient encouraged to call the office with any questions, concerns, change in symptoms.   Emma Mathews DPM

## 2019-06-27 DIAGNOSIS — M6281 Muscle weakness (generalized): Secondary | ICD-10-CM | POA: Diagnosis not present

## 2019-06-27 DIAGNOSIS — M25572 Pain in left ankle and joints of left foot: Secondary | ICD-10-CM | POA: Diagnosis not present

## 2019-06-27 DIAGNOSIS — R262 Difficulty in walking, not elsewhere classified: Secondary | ICD-10-CM | POA: Diagnosis not present

## 2019-07-05 DIAGNOSIS — R262 Difficulty in walking, not elsewhere classified: Secondary | ICD-10-CM | POA: Diagnosis not present

## 2019-07-05 DIAGNOSIS — M25572 Pain in left ankle and joints of left foot: Secondary | ICD-10-CM | POA: Diagnosis not present

## 2019-07-05 DIAGNOSIS — M6281 Muscle weakness (generalized): Secondary | ICD-10-CM | POA: Diagnosis not present

## 2019-07-10 DIAGNOSIS — M25572 Pain in left ankle and joints of left foot: Secondary | ICD-10-CM | POA: Diagnosis not present

## 2019-07-10 DIAGNOSIS — R262 Difficulty in walking, not elsewhere classified: Secondary | ICD-10-CM | POA: Diagnosis not present

## 2019-07-10 DIAGNOSIS — M6281 Muscle weakness (generalized): Secondary | ICD-10-CM | POA: Diagnosis not present

## 2019-07-13 DIAGNOSIS — M25572 Pain in left ankle and joints of left foot: Secondary | ICD-10-CM | POA: Diagnosis not present

## 2019-07-13 DIAGNOSIS — M6281 Muscle weakness (generalized): Secondary | ICD-10-CM | POA: Diagnosis not present

## 2019-07-13 DIAGNOSIS — R262 Difficulty in walking, not elsewhere classified: Secondary | ICD-10-CM | POA: Diagnosis not present

## 2019-07-17 DIAGNOSIS — M6281 Muscle weakness (generalized): Secondary | ICD-10-CM | POA: Diagnosis not present

## 2019-07-17 DIAGNOSIS — R262 Difficulty in walking, not elsewhere classified: Secondary | ICD-10-CM | POA: Diagnosis not present

## 2019-07-17 DIAGNOSIS — M25572 Pain in left ankle and joints of left foot: Secondary | ICD-10-CM | POA: Diagnosis not present

## 2019-07-20 DIAGNOSIS — M6281 Muscle weakness (generalized): Secondary | ICD-10-CM | POA: Diagnosis not present

## 2019-07-20 DIAGNOSIS — M25572 Pain in left ankle and joints of left foot: Secondary | ICD-10-CM | POA: Diagnosis not present

## 2019-07-20 DIAGNOSIS — R262 Difficulty in walking, not elsewhere classified: Secondary | ICD-10-CM | POA: Diagnosis not present

## 2019-07-25 DIAGNOSIS — R262 Difficulty in walking, not elsewhere classified: Secondary | ICD-10-CM | POA: Diagnosis not present

## 2019-07-25 DIAGNOSIS — M6281 Muscle weakness (generalized): Secondary | ICD-10-CM | POA: Diagnosis not present

## 2019-07-25 DIAGNOSIS — M25572 Pain in left ankle and joints of left foot: Secondary | ICD-10-CM | POA: Diagnosis not present

## 2019-07-27 DIAGNOSIS — R262 Difficulty in walking, not elsewhere classified: Secondary | ICD-10-CM | POA: Diagnosis not present

## 2019-07-27 DIAGNOSIS — M25572 Pain in left ankle and joints of left foot: Secondary | ICD-10-CM | POA: Diagnosis not present

## 2019-07-27 DIAGNOSIS — M6281 Muscle weakness (generalized): Secondary | ICD-10-CM | POA: Diagnosis not present

## 2019-08-03 DIAGNOSIS — M25572 Pain in left ankle and joints of left foot: Secondary | ICD-10-CM | POA: Diagnosis not present

## 2019-08-03 DIAGNOSIS — R262 Difficulty in walking, not elsewhere classified: Secondary | ICD-10-CM | POA: Diagnosis not present

## 2019-08-03 DIAGNOSIS — M6281 Muscle weakness (generalized): Secondary | ICD-10-CM | POA: Diagnosis not present

## 2019-08-13 ENCOUNTER — Other Ambulatory Visit: Payer: 59

## 2019-08-14 ENCOUNTER — Ambulatory Visit: Payer: Self-pay

## 2019-08-14 ENCOUNTER — Other Ambulatory Visit: Payer: Self-pay

## 2019-08-14 DIAGNOSIS — M722 Plantar fascial fibromatosis: Secondary | ICD-10-CM

## 2019-08-14 DIAGNOSIS — M79676 Pain in unspecified toe(s): Secondary | ICD-10-CM

## 2019-08-15 DIAGNOSIS — M25572 Pain in left ankle and joints of left foot: Secondary | ICD-10-CM | POA: Diagnosis not present

## 2019-08-15 DIAGNOSIS — M6281 Muscle weakness (generalized): Secondary | ICD-10-CM | POA: Diagnosis not present

## 2019-08-15 DIAGNOSIS — R262 Difficulty in walking, not elsewhere classified: Secondary | ICD-10-CM | POA: Diagnosis not present

## 2019-08-17 DIAGNOSIS — M6281 Muscle weakness (generalized): Secondary | ICD-10-CM | POA: Diagnosis not present

## 2019-08-17 DIAGNOSIS — M25572 Pain in left ankle and joints of left foot: Secondary | ICD-10-CM | POA: Diagnosis not present

## 2019-08-17 DIAGNOSIS — R262 Difficulty in walking, not elsewhere classified: Secondary | ICD-10-CM | POA: Diagnosis not present

## 2019-08-21 ENCOUNTER — Ambulatory Visit: Payer: Self-pay

## 2019-08-21 ENCOUNTER — Other Ambulatory Visit: Payer: Self-pay

## 2019-08-21 DIAGNOSIS — M79676 Pain in unspecified toe(s): Secondary | ICD-10-CM

## 2019-08-21 DIAGNOSIS — M722 Plantar fascial fibromatosis: Secondary | ICD-10-CM

## 2019-08-21 NOTE — Progress Notes (Signed)
Patient is here today with complaint of left foot pain, this is been ongoing for several months, she has tried injections, supportive shoes, ice, stretching and has not had any pain relief so far.  Recently diagnosed with plantar fasciitis left foot.  ESWT administered for 4 J and tolerated well.  She is to avoid NSAIDs and ice and utilize her boot or supportive shoe.  Follow-up next week for second treatment.

## 2019-08-21 NOTE — Progress Notes (Signed)
Patient is here today with complaint of left foot pain, this is been ongoing for several months, she has tried injections, supportive shoes, ice, stretching and has not had any pain relief so far.  Recently diagnosed with plantar fasciitis left foot.  ESWT administered for 11 J and tolerated well.  She is to avoid NSAIDs and ice and utilize her boot or supportive shoe.  Follow-up next week for 2nd  treatment.

## 2019-08-24 DIAGNOSIS — R262 Difficulty in walking, not elsewhere classified: Secondary | ICD-10-CM | POA: Diagnosis not present

## 2019-08-24 DIAGNOSIS — M6281 Muscle weakness (generalized): Secondary | ICD-10-CM | POA: Diagnosis not present

## 2019-08-24 DIAGNOSIS — M25572 Pain in left ankle and joints of left foot: Secondary | ICD-10-CM | POA: Diagnosis not present

## 2019-08-27 DIAGNOSIS — J300 Vasomotor rhinitis: Secondary | ICD-10-CM | POA: Diagnosis not present

## 2019-08-27 DIAGNOSIS — H93293 Other abnormal auditory perceptions, bilateral: Secondary | ICD-10-CM | POA: Diagnosis not present

## 2019-08-27 DIAGNOSIS — R0683 Snoring: Secondary | ICD-10-CM | POA: Diagnosis not present

## 2019-08-28 ENCOUNTER — Other Ambulatory Visit: Payer: Self-pay

## 2019-08-28 ENCOUNTER — Ambulatory Visit: Payer: 59

## 2019-08-28 DIAGNOSIS — M722 Plantar fascial fibromatosis: Secondary | ICD-10-CM

## 2019-08-28 DIAGNOSIS — M79676 Pain in unspecified toe(s): Secondary | ICD-10-CM

## 2019-08-28 NOTE — Progress Notes (Signed)
Patient is here today with complaint of left foot pain, this is been ongoing for several months, she has tried injections, supportive shoes, ice, stretching and has not had any pain relief so far.  Recently diagnosed with plantar fasciitis left foot. She states that she is noticing changes, the area is not as sore or tight, and her pain is not as intense in the morning.  ESWT administered for 12 J and tolerated well.  She is to avoid NSAIDs and ice and utilize her boot or supportive shoe.  Follow-up in 2 weeks for 4th treatment

## 2019-08-31 DIAGNOSIS — M6281 Muscle weakness (generalized): Secondary | ICD-10-CM | POA: Diagnosis not present

## 2019-09-07 DIAGNOSIS — R262 Difficulty in walking, not elsewhere classified: Secondary | ICD-10-CM | POA: Diagnosis not present

## 2019-09-07 DIAGNOSIS — M6281 Muscle weakness (generalized): Secondary | ICD-10-CM | POA: Diagnosis not present

## 2019-09-07 DIAGNOSIS — M25572 Pain in left ankle and joints of left foot: Secondary | ICD-10-CM | POA: Diagnosis not present

## 2019-09-12 ENCOUNTER — Other Ambulatory Visit: Payer: Self-pay | Admitting: Neurology

## 2019-09-12 MED FILL — RIZATRIPTAN 10 MG ODT: 10 | 25 days supply | Qty: 15 | Fill #0

## 2019-09-14 ENCOUNTER — Other Ambulatory Visit: Payer: Self-pay

## 2019-09-14 ENCOUNTER — Ambulatory Visit (INDEPENDENT_AMBULATORY_CARE_PROVIDER_SITE_OTHER): Payer: Self-pay | Admitting: *Deleted

## 2019-09-14 DIAGNOSIS — M79676 Pain in unspecified toe(s): Secondary | ICD-10-CM

## 2019-09-14 DIAGNOSIS — M722 Plantar fascial fibromatosis: Secondary | ICD-10-CM

## 2019-09-14 NOTE — Progress Notes (Signed)
Patient is here today with complaint of left foot pain, this is been ongoing for several months, she has tried injections, supportive shoes, ice, stretching and has not had any pain relief so far. Diagnosed with plantar fasciitis left foot. She states that she has noticed very little improvement since last visit. She has stopped all excessive activity. Her morning pain seems to still be better. She did start dry needling last week at PT. The pain is the worst at the end of the day.  ESWT administered and tolerated well. Ended treatment session today with 3000 shocks at the following settings:   Energy: 25  Frequency: 4.0  Joules: 21.05  She is to avoid NSAIDs and ice and utilize her boot. This was her 4th treatment and will follow up with Dr. Jacqualyn Posey in 1 month for re-evaluation.

## 2019-09-25 DIAGNOSIS — M25572 Pain in left ankle and joints of left foot: Secondary | ICD-10-CM | POA: Diagnosis not present

## 2019-09-25 DIAGNOSIS — R262 Difficulty in walking, not elsewhere classified: Secondary | ICD-10-CM | POA: Diagnosis not present

## 2019-09-25 DIAGNOSIS — M6281 Muscle weakness (generalized): Secondary | ICD-10-CM | POA: Diagnosis not present

## 2019-10-18 ENCOUNTER — Ambulatory Visit (INDEPENDENT_AMBULATORY_CARE_PROVIDER_SITE_OTHER): Payer: 59 | Admitting: Podiatry

## 2019-10-18 ENCOUNTER — Other Ambulatory Visit: Payer: Self-pay

## 2019-10-18 DIAGNOSIS — M722 Plantar fascial fibromatosis: Secondary | ICD-10-CM

## 2019-10-19 NOTE — Progress Notes (Signed)
Subjective: 43 year old female presents the office for follow-up evaluation of left foot plantar fasciitis.  She states that overall she is doing much better and the EPAT treatments have been helpful.  She states particular the last EPAT treatment has been the most helpful.  She still gets an occasional discomfort she points more to the plantar lateral aspect of the heel.Denies any systemic complaints such as fevers, chills, nausea, vomiting. No acute changes since last appointment, and no other complaints at this time.   Objective: AAO x3, NAD DP/PT pulses palpable bilaterally, CRT less than 3 seconds There is mild tenderness palpation of the plantar aspect of the calcaneus at insertion of plantar fascial most along lateral aspect.  There is no pain with compression of calcaneus.  No pain with Achilles tendon.  Plantar fascia, Achilles tendon appear to be intact.  Negative Tinel sign. No pain with calf compression, swelling, warmth, erythema  Assessment: Left foot plantar fasciitis with improvement   Plan: -All treatment options discussed with the patient including all alternatives, risks, complications.  -After discussion regards to treatment options with I do one further treatment of the EPAT as she felt the last treatment was more helpful.  After the I will see her back in 4 weeks following her treatment for possible injection if needed.  Under continue with stretching exercise as well as wearing supportive shoes and inserts. -Patient encouraged to call the office with any questions, concerns, change in symptoms.   Trula Slade DPM

## 2019-10-26 ENCOUNTER — Ambulatory Visit: Payer: 59 | Admitting: Family Medicine

## 2019-10-26 DIAGNOSIS — S39012A Strain of muscle, fascia and tendon of lower back, initial encounter: Secondary | ICD-10-CM | POA: Diagnosis not present

## 2019-10-26 DIAGNOSIS — M545 Low back pain, unspecified: Secondary | ICD-10-CM | POA: Insufficient documentation

## 2019-10-26 MED FILL — predniSONE 10 MG (21) TBPK: 10 | 6 days supply | Qty: 21 | Fill #0

## 2019-10-26 MED FILL — METHOCARBAMOL 750 MG TABS: 750 | 7 days supply | Qty: 21 | Fill #0

## 2019-10-29 DIAGNOSIS — M9903 Segmental and somatic dysfunction of lumbar region: Secondary | ICD-10-CM | POA: Diagnosis not present

## 2019-10-29 DIAGNOSIS — M25551 Pain in right hip: Secondary | ICD-10-CM | POA: Diagnosis not present

## 2019-10-29 DIAGNOSIS — M5136 Other intervertebral disc degeneration, lumbar region: Secondary | ICD-10-CM | POA: Diagnosis not present

## 2019-10-29 DIAGNOSIS — M9905 Segmental and somatic dysfunction of pelvic region: Secondary | ICD-10-CM | POA: Diagnosis not present

## 2019-10-30 DIAGNOSIS — M9905 Segmental and somatic dysfunction of pelvic region: Secondary | ICD-10-CM | POA: Diagnosis not present

## 2019-10-30 DIAGNOSIS — M9903 Segmental and somatic dysfunction of lumbar region: Secondary | ICD-10-CM | POA: Diagnosis not present

## 2019-10-30 DIAGNOSIS — M25551 Pain in right hip: Secondary | ICD-10-CM | POA: Diagnosis not present

## 2019-10-30 DIAGNOSIS — M5136 Other intervertebral disc degeneration, lumbar region: Secondary | ICD-10-CM | POA: Diagnosis not present

## 2019-10-31 DIAGNOSIS — M25551 Pain in right hip: Secondary | ICD-10-CM | POA: Diagnosis not present

## 2019-10-31 DIAGNOSIS — M5136 Other intervertebral disc degeneration, lumbar region: Secondary | ICD-10-CM | POA: Diagnosis not present

## 2019-10-31 DIAGNOSIS — M9905 Segmental and somatic dysfunction of pelvic region: Secondary | ICD-10-CM | POA: Diagnosis not present

## 2019-10-31 DIAGNOSIS — M9903 Segmental and somatic dysfunction of lumbar region: Secondary | ICD-10-CM | POA: Diagnosis not present

## 2019-11-01 DIAGNOSIS — M545 Low back pain: Secondary | ICD-10-CM | POA: Diagnosis not present

## 2019-11-19 ENCOUNTER — Other Ambulatory Visit: Payer: Self-pay

## 2019-11-19 ENCOUNTER — Ambulatory Visit (INDEPENDENT_AMBULATORY_CARE_PROVIDER_SITE_OTHER): Payer: 59 | Admitting: *Deleted

## 2019-11-19 DIAGNOSIS — M722 Plantar fascial fibromatosis: Secondary | ICD-10-CM

## 2019-11-19 DIAGNOSIS — M79676 Pain in unspecified toe(s): Secondary | ICD-10-CM

## 2019-11-19 NOTE — Progress Notes (Signed)
Patient presents for the 5th EPAT treatment today with complaint of plantar heel pain left. Diagnosed with plantar fasciitis by Dr. Jacqualyn Posey. This has been ongoing for several months. The patient has tried ice, stretching, NSAIDS and supportive shoe gear with no long term relief. Most of the pain is located lateral side of foot and medial heel/arch. She has been continuing PT and stretching at home. She says the morning pain has worsened, but the end of day pain is better. She feels overall the EPAT is helping her.  ESWT administered and tolerated well. Treatment settings initiated at:   Energy: 25 for 2000  Ended treatment session today with 3000 shocks at the following settings:   Energy: 30 for 1000  Frequency: 4.0  Joules: 26.16  Arch massager was utilized x 3 rounds  Advised to avoid ice and NSAIDs and to utilize boot or supportive shoes for at least the next 3 days.  Patient brought her boot today and wore out after treatment.   Follow up with Dr. Jacqualyn Posey in 4 weeks at [place] request.

## 2019-11-23 ENCOUNTER — Other Ambulatory Visit: Payer: 59

## 2019-12-11 DIAGNOSIS — M5136 Other intervertebral disc degeneration, lumbar region: Secondary | ICD-10-CM | POA: Diagnosis not present

## 2019-12-21 ENCOUNTER — Encounter: Payer: Self-pay | Admitting: Adult Health Nurse Practitioner

## 2019-12-21 ENCOUNTER — Other Ambulatory Visit: Payer: Self-pay

## 2019-12-21 ENCOUNTER — Ambulatory Visit: Payer: 59 | Admitting: Adult Health Nurse Practitioner

## 2019-12-21 VITALS — BP 109/73 | HR 71 | Temp 97.8°F | Ht 67.0 in | Wt 205.2 lb

## 2019-12-21 DIAGNOSIS — H6991 Unspecified Eustachian tube disorder, right ear: Secondary | ICD-10-CM | POA: Insufficient documentation

## 2019-12-21 MED ORDER — PREDNISONE 20 MG PO TABS: ORAL_TABLET | ORAL | 0 refills | Status: DC

## 2019-12-21 MED FILL — predniSONE 20 MG TABS: 20 | 4 days supply | Qty: 8 | Fill #0

## 2019-12-21 NOTE — Progress Notes (Signed)
   12/21/2019  Emma Mathews 1976-03-15 [id number]  (S) 44 y.o. female complains of pain in right ear for 4 days.    ENT ROS: positive for - hearing change, nasal congestion and nasal discharge negative for - sinus pain or sore throat  (O) She appears well, afebrile. Right ear reveals tenderness of the tragus;  ENT exam reveals - ENT exam normal, no neck nodes or sinus tenderness, left TM normal without fluid or infection, right TM fluid noted, neck has right anterior cervical nodes enlarged, post nasal drip noted and nasal mucosa congested.   (A) Otitis Media vs. Eustachian Tube Dysfunction  (P) IWill try oral Prednisone to decrease inflammation and if symptoms persist or worsen, will f/u for antibiotic.  Recommended decongestant as well for symptoms.  All questions were answered and she is inline with this plan.   Glyn Ade, NP

## 2019-12-21 NOTE — Patient Instructions (Signed)
° ° ° °  If you have lab work done today you will be contacted with your lab results within the next 2 weeks.  If you have not heard from us then please contact us. The fastest way to get your results is to register for My Chart. ° ° °IF you received an x-ray today, you will receive an invoice from [facility]. Please contact [facility] at [phone] with questions or concerns regarding your invoice.  ° °IF you received labwork today, you will receive an invoice from [facility]. Please contact [facility] at [phone] with questions or concerns regarding your invoice.  ° °Our billing staff will not be able to assist you with questions regarding bills from these companies. ° °You will be contacted with the lab results as soon as they are available. The fastest way to get your results is to activate your My Chart account. Instructions are located on the last page of this paperwork. If you have not heard from us regarding the results in 2 weeks, please contact this office. °  ° ° ° °

## 2019-12-27 ENCOUNTER — Other Ambulatory Visit: Payer: Self-pay

## 2019-12-27 ENCOUNTER — Ambulatory Visit (INDEPENDENT_AMBULATORY_CARE_PROVIDER_SITE_OTHER): Payer: 59 | Admitting: Podiatry

## 2019-12-27 DIAGNOSIS — M216X2 Other acquired deformities of left foot: Secondary | ICD-10-CM | POA: Diagnosis not present

## 2019-12-27 DIAGNOSIS — M722 Plantar fascial fibromatosis: Secondary | ICD-10-CM

## 2020-01-08 DIAGNOSIS — M216X2 Other acquired deformities of left foot: Secondary | ICD-10-CM | POA: Insufficient documentation

## 2020-01-08 NOTE — Progress Notes (Signed)
Subjective: 44 year old female presents the office today for Evaluation of left foot plantar fasciitis.  She states that currently having no pain but she is also on steroids for other issues.  She does feel that she is making progress prior to this but she was still having discomfort prior to starting the steroids.  She denies any recent injury or changes since I last saw her. Denies any systemic complaints such as fevers, chills, nausea, vomiting. No acute changes since last appointment, and no other complaints at this time.   Objective: AAO x3, NAD DP/PT pulses palpable bilaterally, CRT less than 3 seconds At this time there is no significant tenderness palpation on the insertion of the plantar fascia on the course of the plantar fascia.  No pain Achilles tendon.  Not able to identify any discomfort today.  However equinus is still present. No open lesions or pre-ulcerative lesions.  No pain with calf compression, swelling, warmth, erythema  Assessment: 44 year old female with left foot plantar fasciitis, equinus  Plan: -All treatment options discussed with the patient including all alternatives, risks, complications.  -Currently no pain but she is also on steroids.  I want her to continue with supportive shoes, inserts, stretching, rehab exercises.  We discussed ultimately I think a lot of her issues is coming from the equinus.  We can refer her back to physical therapy to see about working the equinus more. -Patient encouraged to call the office with any questions, concerns, change in symptoms.   Trula Slade DPM

## 2020-01-09 DIAGNOSIS — M25572 Pain in left ankle and joints of left foot: Secondary | ICD-10-CM | POA: Diagnosis not present

## 2020-01-09 DIAGNOSIS — R2681 Unsteadiness on feet: Secondary | ICD-10-CM | POA: Diagnosis not present

## 2020-01-09 DIAGNOSIS — E669 Obesity, unspecified: Secondary | ICD-10-CM | POA: Diagnosis not present

## 2020-01-09 DIAGNOSIS — M25472 Effusion, left ankle: Secondary | ICD-10-CM | POA: Diagnosis not present

## 2020-01-09 DIAGNOSIS — M62572 Muscle wasting and atrophy, not elsewhere classified, left ankle and foot: Secondary | ICD-10-CM | POA: Diagnosis not present

## 2020-01-09 DIAGNOSIS — R2689 Other abnormalities of gait and mobility: Secondary | ICD-10-CM | POA: Diagnosis not present

## 2020-01-14 DIAGNOSIS — R2689 Other abnormalities of gait and mobility: Secondary | ICD-10-CM | POA: Diagnosis not present

## 2020-01-14 DIAGNOSIS — M62572 Muscle wasting and atrophy, not elsewhere classified, left ankle and foot: Secondary | ICD-10-CM | POA: Diagnosis not present

## 2020-01-14 DIAGNOSIS — M25572 Pain in left ankle and joints of left foot: Secondary | ICD-10-CM | POA: Diagnosis not present

## 2020-01-14 DIAGNOSIS — M25472 Effusion, left ankle: Secondary | ICD-10-CM | POA: Diagnosis not present

## 2020-01-14 DIAGNOSIS — R2681 Unsteadiness on feet: Secondary | ICD-10-CM | POA: Diagnosis not present

## 2020-01-14 DIAGNOSIS — E669 Obesity, unspecified: Secondary | ICD-10-CM | POA: Diagnosis not present

## 2020-01-17 DIAGNOSIS — M25472 Effusion, left ankle: Secondary | ICD-10-CM | POA: Diagnosis not present

## 2020-01-17 DIAGNOSIS — R2681 Unsteadiness on feet: Secondary | ICD-10-CM | POA: Diagnosis not present

## 2020-01-17 DIAGNOSIS — R2689 Other abnormalities of gait and mobility: Secondary | ICD-10-CM | POA: Diagnosis not present

## 2020-01-17 DIAGNOSIS — M25572 Pain in left ankle and joints of left foot: Secondary | ICD-10-CM | POA: Diagnosis not present

## 2020-01-17 DIAGNOSIS — E669 Obesity, unspecified: Secondary | ICD-10-CM | POA: Diagnosis not present

## 2020-01-17 DIAGNOSIS — M62572 Muscle wasting and atrophy, not elsewhere classified, left ankle and foot: Secondary | ICD-10-CM | POA: Diagnosis not present

## 2020-01-21 DIAGNOSIS — M62572 Muscle wasting and atrophy, not elsewhere classified, left ankle and foot: Secondary | ICD-10-CM | POA: Diagnosis not present

## 2020-01-21 DIAGNOSIS — M25572 Pain in left ankle and joints of left foot: Secondary | ICD-10-CM | POA: Diagnosis not present

## 2020-01-21 DIAGNOSIS — M25472 Effusion, left ankle: Secondary | ICD-10-CM | POA: Diagnosis not present

## 2020-01-21 DIAGNOSIS — R2681 Unsteadiness on feet: Secondary | ICD-10-CM | POA: Diagnosis not present

## 2020-01-21 DIAGNOSIS — R2689 Other abnormalities of gait and mobility: Secondary | ICD-10-CM | POA: Diagnosis not present

## 2020-01-21 DIAGNOSIS — E669 Obesity, unspecified: Secondary | ICD-10-CM | POA: Diagnosis not present

## 2020-01-24 DIAGNOSIS — M25472 Effusion, left ankle: Secondary | ICD-10-CM | POA: Diagnosis not present

## 2020-01-24 DIAGNOSIS — M62572 Muscle wasting and atrophy, not elsewhere classified, left ankle and foot: Secondary | ICD-10-CM | POA: Diagnosis not present

## 2020-01-24 DIAGNOSIS — M25572 Pain in left ankle and joints of left foot: Secondary | ICD-10-CM | POA: Diagnosis not present

## 2020-01-24 DIAGNOSIS — R2689 Other abnormalities of gait and mobility: Secondary | ICD-10-CM | POA: Diagnosis not present

## 2020-01-24 DIAGNOSIS — R2681 Unsteadiness on feet: Secondary | ICD-10-CM | POA: Diagnosis not present

## 2020-01-24 DIAGNOSIS — E669 Obesity, unspecified: Secondary | ICD-10-CM | POA: Diagnosis not present

## 2020-01-29 DIAGNOSIS — M62572 Muscle wasting and atrophy, not elsewhere classified, left ankle and foot: Secondary | ICD-10-CM | POA: Diagnosis not present

## 2020-01-29 DIAGNOSIS — M25472 Effusion, left ankle: Secondary | ICD-10-CM | POA: Diagnosis not present

## 2020-01-29 DIAGNOSIS — R2689 Other abnormalities of gait and mobility: Secondary | ICD-10-CM | POA: Diagnosis not present

## 2020-01-29 DIAGNOSIS — R2681 Unsteadiness on feet: Secondary | ICD-10-CM | POA: Diagnosis not present

## 2020-01-29 DIAGNOSIS — E669 Obesity, unspecified: Secondary | ICD-10-CM | POA: Diagnosis not present

## 2020-01-29 DIAGNOSIS — M25572 Pain in left ankle and joints of left foot: Secondary | ICD-10-CM | POA: Diagnosis not present

## 2020-01-31 DIAGNOSIS — E669 Obesity, unspecified: Secondary | ICD-10-CM | POA: Diagnosis not present

## 2020-01-31 DIAGNOSIS — M25472 Effusion, left ankle: Secondary | ICD-10-CM | POA: Diagnosis not present

## 2020-01-31 DIAGNOSIS — R2681 Unsteadiness on feet: Secondary | ICD-10-CM | POA: Diagnosis not present

## 2020-01-31 DIAGNOSIS — R2689 Other abnormalities of gait and mobility: Secondary | ICD-10-CM | POA: Diagnosis not present

## 2020-01-31 DIAGNOSIS — M62572 Muscle wasting and atrophy, not elsewhere classified, left ankle and foot: Secondary | ICD-10-CM | POA: Diagnosis not present

## 2020-01-31 DIAGNOSIS — M25572 Pain in left ankle and joints of left foot: Secondary | ICD-10-CM | POA: Diagnosis not present

## 2020-02-07 DIAGNOSIS — M62572 Muscle wasting and atrophy, not elsewhere classified, left ankle and foot: Secondary | ICD-10-CM | POA: Diagnosis not present

## 2020-02-07 DIAGNOSIS — R2681 Unsteadiness on feet: Secondary | ICD-10-CM | POA: Diagnosis not present

## 2020-02-07 DIAGNOSIS — E669 Obesity, unspecified: Secondary | ICD-10-CM | POA: Diagnosis not present

## 2020-02-07 DIAGNOSIS — R2689 Other abnormalities of gait and mobility: Secondary | ICD-10-CM | POA: Diagnosis not present

## 2020-02-07 DIAGNOSIS — M25472 Effusion, left ankle: Secondary | ICD-10-CM | POA: Diagnosis not present

## 2020-02-07 DIAGNOSIS — M25572 Pain in left ankle and joints of left foot: Secondary | ICD-10-CM | POA: Diagnosis not present

## 2020-02-11 DIAGNOSIS — R2689 Other abnormalities of gait and mobility: Secondary | ICD-10-CM | POA: Diagnosis not present

## 2020-02-11 DIAGNOSIS — M25472 Effusion, left ankle: Secondary | ICD-10-CM | POA: Diagnosis not present

## 2020-02-11 DIAGNOSIS — M62572 Muscle wasting and atrophy, not elsewhere classified, left ankle and foot: Secondary | ICD-10-CM | POA: Diagnosis not present

## 2020-02-11 DIAGNOSIS — E669 Obesity, unspecified: Secondary | ICD-10-CM | POA: Diagnosis not present

## 2020-02-11 DIAGNOSIS — M25572 Pain in left ankle and joints of left foot: Secondary | ICD-10-CM | POA: Diagnosis not present

## 2020-02-11 DIAGNOSIS — R2681 Unsteadiness on feet: Secondary | ICD-10-CM | POA: Diagnosis not present

## 2020-02-21 DIAGNOSIS — M25572 Pain in left ankle and joints of left foot: Secondary | ICD-10-CM | POA: Diagnosis not present

## 2020-02-21 DIAGNOSIS — R2689 Other abnormalities of gait and mobility: Secondary | ICD-10-CM | POA: Diagnosis not present

## 2020-02-21 DIAGNOSIS — M25472 Effusion, left ankle: Secondary | ICD-10-CM | POA: Diagnosis not present

## 2020-02-21 DIAGNOSIS — E669 Obesity, unspecified: Secondary | ICD-10-CM | POA: Diagnosis not present

## 2020-02-21 DIAGNOSIS — R2681 Unsteadiness on feet: Secondary | ICD-10-CM | POA: Diagnosis not present

## 2020-02-21 DIAGNOSIS — M62572 Muscle wasting and atrophy, not elsewhere classified, left ankle and foot: Secondary | ICD-10-CM | POA: Diagnosis not present

## 2020-02-28 DIAGNOSIS — E669 Obesity, unspecified: Secondary | ICD-10-CM | POA: Diagnosis not present

## 2020-02-28 DIAGNOSIS — M62572 Muscle wasting and atrophy, not elsewhere classified, left ankle and foot: Secondary | ICD-10-CM | POA: Diagnosis not present

## 2020-02-28 DIAGNOSIS — R2689 Other abnormalities of gait and mobility: Secondary | ICD-10-CM | POA: Diagnosis not present

## 2020-02-28 DIAGNOSIS — M25572 Pain in left ankle and joints of left foot: Secondary | ICD-10-CM | POA: Diagnosis not present

## 2020-02-28 DIAGNOSIS — M25472 Effusion, left ankle: Secondary | ICD-10-CM | POA: Diagnosis not present

## 2020-02-28 DIAGNOSIS — R2681 Unsteadiness on feet: Secondary | ICD-10-CM | POA: Diagnosis not present

## 2020-03-04 DIAGNOSIS — Z9889 Other specified postprocedural states: Secondary | ICD-10-CM | POA: Diagnosis not present

## 2020-03-04 DIAGNOSIS — H52203 Unspecified astigmatism, bilateral: Secondary | ICD-10-CM | POA: Diagnosis not present

## 2020-03-04 DIAGNOSIS — H524 Presbyopia: Secondary | ICD-10-CM | POA: Diagnosis not present

## 2020-03-04 DIAGNOSIS — H5213 Myopia, bilateral: Secondary | ICD-10-CM | POA: Diagnosis not present

## 2020-03-06 DIAGNOSIS — R2689 Other abnormalities of gait and mobility: Secondary | ICD-10-CM | POA: Diagnosis not present

## 2020-03-06 DIAGNOSIS — M62572 Muscle wasting and atrophy, not elsewhere classified, left ankle and foot: Secondary | ICD-10-CM | POA: Diagnosis not present

## 2020-03-06 DIAGNOSIS — M25472 Effusion, left ankle: Secondary | ICD-10-CM | POA: Diagnosis not present

## 2020-03-06 DIAGNOSIS — R2681 Unsteadiness on feet: Secondary | ICD-10-CM | POA: Diagnosis not present

## 2020-03-06 DIAGNOSIS — E669 Obesity, unspecified: Secondary | ICD-10-CM | POA: Diagnosis not present

## 2020-03-06 DIAGNOSIS — M25572 Pain in left ankle and joints of left foot: Secondary | ICD-10-CM | POA: Diagnosis not present

## 2020-05-21 DIAGNOSIS — M9905 Segmental and somatic dysfunction of pelvic region: Secondary | ICD-10-CM | POA: Diagnosis not present

## 2020-05-21 DIAGNOSIS — M9903 Segmental and somatic dysfunction of lumbar region: Secondary | ICD-10-CM | POA: Diagnosis not present

## 2020-05-21 DIAGNOSIS — M9906 Segmental and somatic dysfunction of lower extremity: Secondary | ICD-10-CM | POA: Diagnosis not present

## 2020-05-21 DIAGNOSIS — M722 Plantar fascial fibromatosis: Secondary | ICD-10-CM | POA: Diagnosis not present

## 2020-05-27 DIAGNOSIS — M722 Plantar fascial fibromatosis: Secondary | ICD-10-CM | POA: Diagnosis not present

## 2020-05-27 DIAGNOSIS — M9906 Segmental and somatic dysfunction of lower extremity: Secondary | ICD-10-CM | POA: Diagnosis not present

## 2020-05-27 DIAGNOSIS — M9905 Segmental and somatic dysfunction of pelvic region: Secondary | ICD-10-CM | POA: Diagnosis not present

## 2020-05-27 DIAGNOSIS — M9903 Segmental and somatic dysfunction of lumbar region: Secondary | ICD-10-CM | POA: Diagnosis not present

## 2020-05-29 DIAGNOSIS — M9905 Segmental and somatic dysfunction of pelvic region: Secondary | ICD-10-CM | POA: Diagnosis not present

## 2020-05-29 DIAGNOSIS — M9903 Segmental and somatic dysfunction of lumbar region: Secondary | ICD-10-CM | POA: Diagnosis not present

## 2020-05-29 DIAGNOSIS — M9906 Segmental and somatic dysfunction of lower extremity: Secondary | ICD-10-CM | POA: Diagnosis not present

## 2020-05-29 DIAGNOSIS — M722 Plantar fascial fibromatosis: Secondary | ICD-10-CM | POA: Diagnosis not present

## 2020-06-16 DIAGNOSIS — M9905 Segmental and somatic dysfunction of pelvic region: Secondary | ICD-10-CM | POA: Diagnosis not present

## 2020-06-16 DIAGNOSIS — M9903 Segmental and somatic dysfunction of lumbar region: Secondary | ICD-10-CM | POA: Diagnosis not present

## 2020-06-16 DIAGNOSIS — M9906 Segmental and somatic dysfunction of lower extremity: Secondary | ICD-10-CM | POA: Diagnosis not present

## 2020-06-16 DIAGNOSIS — M722 Plantar fascial fibromatosis: Secondary | ICD-10-CM | POA: Diagnosis not present

## 2020-06-23 DIAGNOSIS — M9906 Segmental and somatic dysfunction of lower extremity: Secondary | ICD-10-CM | POA: Diagnosis not present

## 2020-06-23 DIAGNOSIS — M9903 Segmental and somatic dysfunction of lumbar region: Secondary | ICD-10-CM | POA: Diagnosis not present

## 2020-06-23 DIAGNOSIS — M722 Plantar fascial fibromatosis: Secondary | ICD-10-CM | POA: Diagnosis not present

## 2020-06-23 DIAGNOSIS — M9905 Segmental and somatic dysfunction of pelvic region: Secondary | ICD-10-CM | POA: Diagnosis not present

## 2020-07-01 DIAGNOSIS — M9905 Segmental and somatic dysfunction of pelvic region: Secondary | ICD-10-CM | POA: Diagnosis not present

## 2020-07-01 DIAGNOSIS — M9906 Segmental and somatic dysfunction of lower extremity: Secondary | ICD-10-CM | POA: Diagnosis not present

## 2020-07-01 DIAGNOSIS — M9903 Segmental and somatic dysfunction of lumbar region: Secondary | ICD-10-CM | POA: Diagnosis not present

## 2020-07-01 DIAGNOSIS — M722 Plantar fascial fibromatosis: Secondary | ICD-10-CM | POA: Diagnosis not present

## 2020-07-09 DIAGNOSIS — M722 Plantar fascial fibromatosis: Secondary | ICD-10-CM | POA: Diagnosis not present

## 2020-07-09 DIAGNOSIS — M9905 Segmental and somatic dysfunction of pelvic region: Secondary | ICD-10-CM | POA: Diagnosis not present

## 2020-07-09 DIAGNOSIS — M9906 Segmental and somatic dysfunction of lower extremity: Secondary | ICD-10-CM | POA: Diagnosis not present

## 2020-07-09 DIAGNOSIS — M9903 Segmental and somatic dysfunction of lumbar region: Secondary | ICD-10-CM | POA: Diagnosis not present

## 2020-07-31 DIAGNOSIS — M9905 Segmental and somatic dysfunction of pelvic region: Secondary | ICD-10-CM | POA: Diagnosis not present

## 2020-07-31 DIAGNOSIS — M9903 Segmental and somatic dysfunction of lumbar region: Secondary | ICD-10-CM | POA: Diagnosis not present

## 2020-07-31 DIAGNOSIS — M722 Plantar fascial fibromatosis: Secondary | ICD-10-CM | POA: Diagnosis not present

## 2020-07-31 DIAGNOSIS — M9906 Segmental and somatic dysfunction of lower extremity: Secondary | ICD-10-CM | POA: Diagnosis not present

## 2020-08-06 DIAGNOSIS — M9906 Segmental and somatic dysfunction of lower extremity: Secondary | ICD-10-CM | POA: Diagnosis not present

## 2020-08-06 DIAGNOSIS — M722 Plantar fascial fibromatosis: Secondary | ICD-10-CM | POA: Diagnosis not present

## 2020-08-06 DIAGNOSIS — M9903 Segmental and somatic dysfunction of lumbar region: Secondary | ICD-10-CM | POA: Diagnosis not present

## 2020-08-06 DIAGNOSIS — M9905 Segmental and somatic dysfunction of pelvic region: Secondary | ICD-10-CM | POA: Diagnosis not present

## 2020-08-20 DIAGNOSIS — M9905 Segmental and somatic dysfunction of pelvic region: Secondary | ICD-10-CM | POA: Diagnosis not present

## 2020-08-20 DIAGNOSIS — M722 Plantar fascial fibromatosis: Secondary | ICD-10-CM | POA: Diagnosis not present

## 2020-08-20 DIAGNOSIS — M9903 Segmental and somatic dysfunction of lumbar region: Secondary | ICD-10-CM | POA: Diagnosis not present

## 2020-08-20 DIAGNOSIS — M9906 Segmental and somatic dysfunction of lower extremity: Secondary | ICD-10-CM | POA: Diagnosis not present

## 2020-09-10 DIAGNOSIS — M9903 Segmental and somatic dysfunction of lumbar region: Secondary | ICD-10-CM | POA: Diagnosis not present

## 2020-09-10 DIAGNOSIS — M9906 Segmental and somatic dysfunction of lower extremity: Secondary | ICD-10-CM | POA: Diagnosis not present

## 2020-09-10 DIAGNOSIS — M9905 Segmental and somatic dysfunction of pelvic region: Secondary | ICD-10-CM | POA: Diagnosis not present

## 2020-09-10 DIAGNOSIS — M722 Plantar fascial fibromatosis: Secondary | ICD-10-CM | POA: Diagnosis not present

## 2020-10-07 DIAGNOSIS — M722 Plantar fascial fibromatosis: Secondary | ICD-10-CM | POA: Diagnosis not present

## 2020-10-07 DIAGNOSIS — M9906 Segmental and somatic dysfunction of lower extremity: Secondary | ICD-10-CM | POA: Diagnosis not present

## 2020-10-07 DIAGNOSIS — M9905 Segmental and somatic dysfunction of pelvic region: Secondary | ICD-10-CM | POA: Diagnosis not present

## 2020-10-07 DIAGNOSIS — M9903 Segmental and somatic dysfunction of lumbar region: Secondary | ICD-10-CM | POA: Diagnosis not present

## 2020-10-11 DIAGNOSIS — X509XXA Other and unspecified overexertion or strenuous movements or postures, initial encounter: Secondary | ICD-10-CM | POA: Diagnosis not present

## 2020-10-11 DIAGNOSIS — S8981XA Other specified injuries of right lower leg, initial encounter: Secondary | ICD-10-CM | POA: Diagnosis not present

## 2020-10-11 DIAGNOSIS — R2241 Localized swelling, mass and lump, right lower limb: Secondary | ICD-10-CM | POA: Diagnosis not present

## 2020-10-11 DIAGNOSIS — Z91041 Radiographic dye allergy status: Secondary | ICD-10-CM | POA: Diagnosis not present

## 2020-10-11 DIAGNOSIS — S93401A Sprain of unspecified ligament of right ankle, initial encounter: Secondary | ICD-10-CM | POA: Diagnosis not present

## 2020-10-13 ENCOUNTER — Other Ambulatory Visit (HOSPITAL_COMMUNITY): Payer: Self-pay | Admitting: Physician Assistant

## 2020-10-13 DIAGNOSIS — S93601A Unspecified sprain of right foot, initial encounter: Secondary | ICD-10-CM | POA: Diagnosis not present

## 2020-10-13 DIAGNOSIS — S93491A Sprain of other ligament of right ankle, initial encounter: Secondary | ICD-10-CM | POA: Diagnosis not present

## 2020-10-14 MED FILL — CELECOXIB 200 MG CAP: 200 | 30 days supply | Qty: 30 | Fill #0

## 2020-10-15 ENCOUNTER — Ambulatory Visit (INDEPENDENT_AMBULATORY_CARE_PROVIDER_SITE_OTHER): Payer: 59 | Admitting: Registered Nurse

## 2020-10-15 ENCOUNTER — Encounter: Payer: Self-pay | Admitting: Registered Nurse

## 2020-10-15 ENCOUNTER — Other Ambulatory Visit: Payer: Self-pay | Admitting: Registered Nurse

## 2020-10-15 ENCOUNTER — Other Ambulatory Visit: Payer: Self-pay

## 2020-10-15 VITALS — BP 125/75 | HR 64 | Temp 98.0°F | Resp 18 | Ht 67.0 in | Wt 223.2 lb

## 2020-10-15 DIAGNOSIS — Z20822 Contact with and (suspected) exposure to covid-19: Secondary | ICD-10-CM | POA: Diagnosis not present

## 2020-10-15 DIAGNOSIS — T753XXA Motion sickness, initial encounter: Secondary | ICD-10-CM | POA: Diagnosis not present

## 2020-10-15 MED ORDER — SCOPOLAMINE 1 MG/3DAYS TD PT72: 1.0000 | MEDICATED_PATCH | TRANSDERMAL | 12 refills | Status: DC

## 2020-10-15 MED ORDER — ALPRAZOLAM 0.25 MG PO TBDP: 0.2500 mg | ORAL_TABLET | Freq: Every evening | ORAL | 0 refills | Status: DC | PRN

## 2020-10-15 MED ORDER — ONDANSETRON 4 MG PO TBDP: 4.0000 mg | ORAL_TABLET | Freq: Three times a day (TID) | ORAL | 6 refills | Status: DC | PRN

## 2020-10-15 MED FILL — SCOPOLAMINE 1 MG/3 DAY PATC: 1 | 30 days supply | Qty: 10 | Fill #0

## 2020-10-15 MED FILL — ALPRAZolam 0.25 MG TBDP: 0.25 | 30 days supply | Qty: 30 | Fill #0

## 2020-10-15 MED FILL — ONDANSETRON ODT 4 MG TABLET: 4 | 6 days supply | Qty: 20 | Fill #0

## 2020-10-15 NOTE — Patient Instructions (Signed)
° ° ° °  If you have lab work done today you will be contacted with your lab results within the next 2 weeks.  If you have not heard from us then please contact us. The fastest way to get your results is to register for My Chart. ° ° °IF you received an x-ray today, you will receive an invoice from [facility]. Please contact [facility] at [phone] with questions or concerns regarding your invoice.  ° °IF you received labwork today, you will receive an invoice from [facility]. Please contact [facility] at [phone] with questions or concerns regarding your invoice.  ° °Our billing staff will not be able to assist you with questions regarding bills from these companies. ° °You will be contacted with the lab results as soon as they are available. The fastest way to get your results is to activate your My Chart account. Instructions are located on the last page of this paperwork. If you have not heard from us regarding the results in 2 weeks, please contact this office. °  ° ° ° °

## 2020-10-30 DIAGNOSIS — S93491D Sprain of other ligament of right ankle, subsequent encounter: Secondary | ICD-10-CM | POA: Diagnosis not present

## 2020-11-11 DIAGNOSIS — M9903 Segmental and somatic dysfunction of lumbar region: Secondary | ICD-10-CM | POA: Diagnosis not present

## 2020-11-11 DIAGNOSIS — M722 Plantar fascial fibromatosis: Secondary | ICD-10-CM | POA: Diagnosis not present

## 2020-11-11 DIAGNOSIS — M9906 Segmental and somatic dysfunction of lower extremity: Secondary | ICD-10-CM | POA: Diagnosis not present

## 2020-11-11 DIAGNOSIS — M9905 Segmental and somatic dysfunction of pelvic region: Secondary | ICD-10-CM | POA: Diagnosis not present

## 2020-12-08 ENCOUNTER — Other Ambulatory Visit: Payer: Self-pay

## 2020-12-08 ENCOUNTER — Ambulatory Visit (INDEPENDENT_AMBULATORY_CARE_PROVIDER_SITE_OTHER): Payer: 59 | Admitting: Registered Nurse

## 2020-12-08 ENCOUNTER — Encounter: Payer: Self-pay | Admitting: Registered Nurse

## 2020-12-08 VITALS — BP 117/83 | HR 89 | Temp 97.9°F | Resp 18 | Ht 67.0 in | Wt 220.2 lb

## 2020-12-08 DIAGNOSIS — T753XXD Motion sickness, subsequent encounter: Secondary | ICD-10-CM | POA: Diagnosis not present

## 2020-12-08 DIAGNOSIS — Z130 Encounter for screening for diseases of the blood and blood-forming organs and certain disorders involving the immune mechanism: Secondary | ICD-10-CM | POA: Diagnosis not present

## 2020-12-08 DIAGNOSIS — Z13220 Encounter for screening for lipoid disorders: Secondary | ICD-10-CM | POA: Diagnosis not present

## 2020-12-08 DIAGNOSIS — Z1329 Encounter for screening for other suspected endocrine disorder: Secondary | ICD-10-CM | POA: Diagnosis not present

## 2020-12-08 DIAGNOSIS — Z0001 Encounter for general adult medical examination with abnormal findings: Secondary | ICD-10-CM

## 2020-12-08 DIAGNOSIS — Z13228 Encounter for screening for other metabolic disorders: Secondary | ICD-10-CM | POA: Diagnosis not present

## 2020-12-08 DIAGNOSIS — Z0000 Encounter for general adult medical examination without abnormal findings: Secondary | ICD-10-CM

## 2020-12-08 DIAGNOSIS — Z7689 Persons encountering health services in other specified circumstances: Secondary | ICD-10-CM

## 2020-12-08 NOTE — Patient Instructions (Signed)
° ° ° °  If you have lab work done today you will be contacted with your lab results within the next 2 weeks.  If you have not heard from us then please contact us. The fastest way to get your results is to register for My Chart. ° ° °IF you received an x-ray today, you will receive an invoice from [facility]. Please contact [facility] at [phone] with questions or concerns regarding your invoice.  ° °IF you received labwork today, you will receive an invoice from [facility]. Please contact [facility] at [phone] with questions or concerns regarding your invoice.  ° °Our billing staff will not be able to assist you with questions regarding bills from these companies. ° °You will be contacted with the lab results as soon as they are available. The fastest way to get your results is to activate your My Chart account. Instructions are located on the last page of this paperwork. If you have not heard from us regarding the results in 2 weeks, please contact this office. °  ° ° ° °

## 2020-12-08 NOTE — Progress Notes (Signed)
Established Patient Office Visit  Subjective:  Patient ID: Emma Mathews, female    DOB: August 02, 1976  Age: 45 y.o. MRN: [id number]  CC:  Chief Complaint  Patient presents with  . Transitions Of Care    Patient states sge is here for [facility] per patient she has no other concerns.    HPI Emma Mathews presents for visit to est care  Hx reviewed, updated as warranted  meds up to date  Recent hx of tx for motion sickness - worked well, trip went well  Notes some recent weight gain through pandemic - working on losing it again - is adjusting to stress and reestablishing good habits  Otherwise no concerns  Past Medical History:  Diagnosis Date  . Appendicitis, acute 10/02/2013  . Cancer ([place])    melanoma excision in 2013  . Cholecystitis   . Complication of anesthesia    slow to wake up  . Headache    migraine  . Mammographic breast lesion 06/28/2017   S/p biopsy, benign   . Pneumonia 2010  . PONV (postoperative nausea and vomiting)   . Seasonal allergies   . STRESS FRACTURE, FOOT 04/08/2009   Qualifier: Diagnosis of  By: Arnoldo Morale MD, Balinda Quails Vision abnormalities     Past Surgical History:  Procedure Laterality Date  . APPENDECTOMY    . CHOLECYSTECTOMY    . EYE SURGERY Bilateral    PRK  . Labrum Repair Right 2000   Open  . LAPAROSCOPIC APPENDECTOMY N/A 09/02/2013   Procedure: APPENDECTOMY LAPAROSCOPIC;  Surgeon: Harl Bowie, MD;  Location: [place];  Service: General;  Laterality: N/A;  . SHOULDER ARTHROSCOPY Left 2005   bone spurs   . SHOULDER ARTHROSCOPY Right 11/01/2017   Procedure: RIGHT SHOULDER ARTHROSCOPY WITH SUBACROMIAL DECOMPRESSION;  Surgeon: Meredith Pel, MD;  Location: [place];  Service: Orthopedics;  Laterality: Right;  . SHOULDER ARTHROSCOPY WITH SUBACROMIAL DECOMPRESSION, ROTATOR CUFF REPAIR AND BICEP TENDON REPAIR Right 02/24/2016   Procedure: RIGHT SHOULDER ARTHROSCOPY WITH DEBRIDEMENT, BICEPS TENODESIS;  Surgeon: Meredith Pel, MD;  Location: [place];  Service: Orthopedics;  Laterality: Right;  . SHOULDER SURGERY Right 2007   Revision and repair flap  . WISDOM TOOTH EXTRACTION    . WRIST ARTHROSCOPY Right 11/09/2017   Procedure: ARTHROSCOPY WRIST WITH DEBRIDEMENT;  Surgeon: Charlotte Crumb, MD;  Location: [place];  Service: Orthopedics;  Laterality: Right;    Family History  Adopted: Yes  Family history unknown: Yes    Social History   Socioeconomic History  . Marital status: Married    Spouse name: Marjory Lies  . Number of children: 1  . Years of education: Not on file  . Highest education level: Not on file  Occupational History  . Not on file  Tobacco Use  . Smoking status: Never Smoker  . Smokeless tobacco: Never Used  Vaping Use  . Vaping Use: Never used  Substance and Sexual Activity  . Alcohol use: Yes    Comment: ocassional - social 1- 2 drinks  . Drug use: No  . Sexual activity: Yes    Birth control/protection: I.U.D.  Other Topics Concern  . Not on file  Social History Narrative   Patient lives with husband and one child, a son named Max.    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    Social Determinants of Health   Financial Resource Strain: Not on file  Food Insecurity:  Not on file  Transportation Needs: Not on file  Physical Activity: Not on file  Stress: Not on file  Social Connections: Not on file  Intimate Partner Violence: Not on file    Outpatient Medications Prior to Visit  Medication Sig Dispense Refill  . ALPRAZolam (NIRAVAM) 0.25 MG dissolvable tablet Take 1 tablet (0.25 mg total) by mouth at bedtime as needed for anxiety. 30 tablet 0  . levonorgestrel (MIRENA) 20 MCG/24HR IUD Mirena 20 mcg/24 hours (5 yrs) 52 mg intrauterine device    . mometasone (NASONEX) 50 MCG/ACT nasal spray Nasonex    . Multiple Vitamins-Minerals (MULTIPLE VITAMINS/WOMENS PO) Multiple Vitamin, Womens    . ondansetron (ZOFRAN ODT) 4 MG  disintegrating tablet Take 1 tablet (4 mg total) by mouth every 8 (eight) hours as needed. 20 tablet 6  . promethazine (PHENERGAN) 12.5 MG tablet promethazine 12.5 mg tablet    . rizatriptan (MAXALT-MLT) 10 MG disintegrating tablet DISSOLVE 1 TABLET (10 MG TOTAL) BY MOUTH AS NEEDED. MAY REPEAT IN 2 HOURS IF NEEDED 15 tablet 0  . scopolamine (TRANSDERM-SCOP) 1 MG/3DAYS Place 1 patch (1.5 mg total) onto the skin every 3 (three) days. 10 patch 12  . SUMAtriptan 6 MG/0.5ML SOAJ Inject 6mg  at onset of migraine.  May repeat in 2 hrs, if needed.  Max dose: 2 injections/day. This is a 30 day prescription. 10 Cartridge 5  . predniSONE (DELTASONE) 20 MG tablet 3 tabs x 1 day, 2 tabs x 2 days, 1 tab x 1 day 8 tablet 0   No facility-administered medications prior to visit.    Allergies  Allergen Reactions  . Diphenhydramine Hcl Hives and Other (See Comments)    Pt states she can take Dye free Benadryl  . Diphenhydramine Hcl Other (See Comments) and Hives    Pt states she can take Dye free Benadryl  . Iodinated Diagnostic Agents Shortness Of Breath, Nausea And Vomiting, Other (See Comments) and Anaphylaxis    Shortness of breath  . Chlorhexidine Gluconate Hives  . Codeine Hives, Nausea And Vomiting and Rash    ROS Review of Systems  Constitutional: Negative.   HENT: Negative.   Eyes: Negative.   Respiratory: Negative.   Cardiovascular: Negative.   Gastrointestinal: Negative.   Genitourinary: Negative.   Musculoskeletal: Negative.   Skin: Negative.   Neurological: Negative.   Psychiatric/Behavioral: Negative.   All other systems reviewed and are negative.     Objective:    Physical Exam Vitals and nursing note reviewed.  Constitutional:      General: She is not in acute distress.    Appearance: Normal appearance. She is normal weight. She is not ill-appearing, toxic-appearing or diaphoretic.  Cardiovascular:     Rate and Rhythm: Normal rate and regular rhythm.     Heart sounds:  Normal heart sounds. No murmur heard. No friction rub. No gallop.   Pulmonary:     Effort: Pulmonary effort is normal. No respiratory distress.     Breath sounds: Normal breath sounds. No stridor. No wheezing, rhonchi or rales.  Chest:     Chest wall: No tenderness.  Skin:    General: Skin is warm and dry.  Neurological:     General: No focal deficit present.     Mental Status: She is alert and oriented to person, place, and time. Mental status is at baseline.  Psychiatric:        Mood and Affect: Mood normal.        Behavior: Behavior normal.  Thought Content: Thought content normal.        Judgment: Judgment normal.     BP 117/83   Pulse 89   Temp 97.9 F (36.6 C) (Temporal)   Resp 18   Ht 5\' 7"  (1.702 m)   Wt 220 lb 3.2 oz (99.9 kg)   SpO2 97%   BMI 34.49 kg/m  Wt Readings from Last 3 Encounters:  12/08/20 220 lb 3.2 oz (99.9 kg)  10/15/20 223 lb 3.2 oz (101.2 kg)  12/21/19 205 lb 3.2 oz (93.1 kg)     There are no preventive care reminders to display for this patient.  There are no preventive care reminders to display for this patient.  Lab Results  Component Value Date   TSH 1.500 02/01/2018   Lab Results  Component Value Date   WBC 4.9 02/01/2018   HGB 15.7 02/01/2018   HCT 45.9 02/01/2018   MCV 85 02/01/2018   PLT 216 02/01/2018   Lab Results  Component Value Date   NA 142 02/01/2018   K 4.4 02/01/2018   CO2 26 02/01/2018   GLUCOSE 87 02/01/2018   BUN 10 02/01/2018   CREATININE 0.74 02/01/2018   BILITOT 0.5 02/01/2018   ALKPHOS 85 02/01/2018   AST 17 02/01/2018   ALT 14 02/01/2018   PROT 7.6 02/01/2018   ALBUMIN 4.6 02/01/2018   CALCIUM 9.7 02/01/2018   ANIONGAP 8 11/01/2017   Lab Results  Component Value Date   CHOL 140 02/01/2018   Lab Results  Component Value Date   HDL 38 (L) 02/01/2018   Lab Results  Component Value Date   LDLCALC 88 02/01/2018   Lab Results  Component Value Date   TRIG 72 02/01/2018   Lab Results   Component Value Date   CHOLHDL 3.7 02/01/2018   No results found for: HGBA1C    Assessment & Plan:   Problem List Items Addressed This Visit      Other   Motion sickness    Other Visit Diagnoses    Encounter to establish care    -  Primary   Screening for endocrine, metabolic and immunity disorder       Relevant Orders   CBC With Differential   Comprehensive metabolic panel   Hemoglobin A1c   TSH   Lipid screening       Relevant Orders   Lipid panel      No orders of the defined types were placed in this encounter.   Follow-up: No follow-ups on file.   PLAN  Histories reviewed and updated  Labs collected. Will follow up with the patient as warranted.  Return annually or for problem visit  Patient encouraged to call clinic with any questions, comments, or concerns.  Maximiano Coss, NP

## 2020-12-09 LAB — COMPREHENSIVE METABOLIC PANEL
ALT: 24 IU/L (ref 0–32)
AST: 18 IU/L (ref 0–40)
Albumin/Globulin Ratio: 1.5 (ref 1.2–2.2)
Albumin: 4.5 g/dL (ref 3.8–4.8)
Alkaline Phosphatase: 92 IU/L (ref 44–121)
BUN/Creatinine Ratio: 17 (ref 9–23)
BUN: 14 mg/dL (ref 6–24)
Bilirubin Total: 0.4 mg/dL (ref 0.0–1.2)
CO2: 21 mmol/L (ref 20–29)
Calcium: 9.4 mg/dL (ref 8.7–10.2)
Chloride: 104 mmol/L (ref 96–106)
Creatinine, Ser: 0.84 mg/dL (ref 0.57–1.00)
GFR calc Af Amer: 98 mL/min/{1.73_m2} (ref >59)
GFR calc non Af Amer: 85 mL/min/{1.73_m2} (ref >59)
Globulin, Total: 3 g/dL (ref 1.5–4.5)
Glucose: 102 mg/dL — ABNORMAL HIGH (ref 65–99)
Potassium: 4.5 mmol/L (ref 3.5–5.2)
Sodium: 140 mmol/L (ref 134–144)
Total Protein: 7.5 g/dL (ref 6.0–8.5)

## 2020-12-09 LAB — CBC WITH DIFFERENTIAL
Basophils Absolute: 0 10*3/uL (ref 0.0–0.2)
Basos: 1 %
EOS (ABSOLUTE): 0.7 10*3/uL — ABNORMAL HIGH (ref 0.0–0.4)
Eos: 11 %
Hematocrit: 44.9 % (ref 34.0–46.6)
Hemoglobin: 15.8 g/dL (ref 11.1–15.9)
Immature Grans (Abs): 0 10*3/uL (ref 0.0–0.1)
Immature Granulocytes: 0 %
Lymphocytes Absolute: 1.6 10*3/uL (ref 0.7–3.1)
Lymphs: 26 %
MCH: 29.6 pg (ref 26.6–33.0)
MCHC: 35.2 g/dL (ref 31.5–35.7)
MCV: 84 fL (ref 79–97)
Monocytes Absolute: 0.6 10*3/uL (ref 0.1–0.9)
Monocytes: 9 %
Neutrophils Absolute: 3.2 10*3/uL (ref 1.4–7.0)
Neutrophils: 53 %
RBC: 5.34 x10E6/uL — ABNORMAL HIGH (ref 3.77–5.28)
RDW: 12.4 % (ref 11.7–15.4)
WBC: 6.1 10*3/uL (ref 3.4–10.8)

## 2020-12-09 LAB — LIPID PANEL
Chol/HDL Ratio: 3.6 ratio (ref 0.0–4.4)
Cholesterol, Total: 140 mg/dL (ref 100–199)
HDL: 39 mg/dL — ABNORMAL LOW (ref >39)
LDL Chol Calc (NIH): 85 mg/dL (ref 0–99)
Triglycerides: 80 mg/dL (ref 0–149)
VLDL Cholesterol Cal: 16 mg/dL (ref 5–40)

## 2020-12-09 LAB — HEMOGLOBIN A1C
Est. average glucose Bld gHb Est-mCnc: 103 mg/dL
Hgb A1c MFr Bld: 5.2 % (ref 4.8–5.6)

## 2020-12-09 LAB — TSH: TSH: 1.77 u[IU]/mL (ref 0.450–4.500)

## 2020-12-15 DIAGNOSIS — M9906 Segmental and somatic dysfunction of lower extremity: Secondary | ICD-10-CM | POA: Diagnosis not present

## 2020-12-15 DIAGNOSIS — M9905 Segmental and somatic dysfunction of pelvic region: Secondary | ICD-10-CM | POA: Diagnosis not present

## 2020-12-15 DIAGNOSIS — M9903 Segmental and somatic dysfunction of lumbar region: Secondary | ICD-10-CM | POA: Diagnosis not present

## 2020-12-15 DIAGNOSIS — M722 Plantar fascial fibromatosis: Secondary | ICD-10-CM | POA: Diagnosis not present

## 2021-01-12 DIAGNOSIS — M9903 Segmental and somatic dysfunction of lumbar region: Secondary | ICD-10-CM | POA: Diagnosis not present

## 2021-01-12 DIAGNOSIS — M9906 Segmental and somatic dysfunction of lower extremity: Secondary | ICD-10-CM | POA: Diagnosis not present

## 2021-01-12 DIAGNOSIS — M722 Plantar fascial fibromatosis: Secondary | ICD-10-CM | POA: Diagnosis not present

## 2021-01-12 DIAGNOSIS — M9905 Segmental and somatic dysfunction of pelvic region: Secondary | ICD-10-CM | POA: Diagnosis not present

## 2021-01-19 ENCOUNTER — Encounter: Payer: Self-pay | Admitting: Registered Nurse

## 2021-01-19 NOTE — Progress Notes (Signed)
Established Patient Office Visit  Subjective:  Patient ID: Emma Mathews, female    DOB: [birth year]  Age: 45 y.o. MRN: [id number]  CC:  Chief Complaint  Patient presents with  . Medication Refill    Patient states she is leaving the country and have some events that requires her getting on a boat and would like to discuss getting medication for motion sickness and to relax    HPI Emma Mathews presents for dizziness, med refill  She has upcoming travel abroad Has had trouble with motion sickness in the past, will be on a boat for an extended period of time Interested in PRN treatment for motion sickness that won't leave her too sedated Has tried some things in the past but they are either ineffective or too sedative. Has a fair amount of anxiety about this affecting the quality of her travel.  Past Medical History:  Diagnosis Date  . Appendicitis, acute 10/02/2013  . Cancer ([place])    melanoma excision in 2013  . Cholecystitis   . Complication of anesthesia    slow to wake up  . Headache    migraine  . Mammographic breast lesion 06/28/2017   S/p biopsy, benign   . Pneumonia 2010  . PONV (postoperative nausea and vomiting)   . Seasonal allergies   . STRESS FRACTURE, FOOT 04/08/2009   Qualifier: Diagnosis of  By: Arnoldo Morale MD, Balinda Quails Vision abnormalities     Past Surgical History:  Procedure Laterality Date  . APPENDECTOMY    . CHOLECYSTECTOMY    . EYE SURGERY Bilateral    PRK  . Labrum Repair Right 2000   Open  . LAPAROSCOPIC APPENDECTOMY N/A 09/02/2013   Procedure: APPENDECTOMY LAPAROSCOPIC;  Surgeon: Harl Bowie, MD;  Location: [place];  Service: General;  Laterality: N/A;  . SHOULDER ARTHROSCOPY Left 2005   bone spurs   . SHOULDER ARTHROSCOPY Right 11/01/2017   Procedure: RIGHT SHOULDER ARTHROSCOPY WITH SUBACROMIAL DECOMPRESSION;  Surgeon: Meredith Pel, MD;  Location: [place];  Service: Orthopedics;  Laterality: Right;  . SHOULDER  ARTHROSCOPY WITH SUBACROMIAL DECOMPRESSION, ROTATOR CUFF REPAIR AND BICEP TENDON REPAIR Right 02/24/2016   Procedure: RIGHT SHOULDER ARTHROSCOPY WITH DEBRIDEMENT, BICEPS TENODESIS;  Surgeon: Meredith Pel, MD;  Location: [place];  Service: Orthopedics;  Laterality: Right;  . SHOULDER SURGERY Right 2007   Revision and repair flap  . WISDOM TOOTH EXTRACTION    . WRIST ARTHROSCOPY Right 11/09/2017   Procedure: ARTHROSCOPY WRIST WITH DEBRIDEMENT;  Surgeon: Charlotte Crumb, MD;  Location: [place];  Service: Orthopedics;  Laterality: Right;    Family History  Adopted: Yes  Family history unknown: Yes    Social History   Socioeconomic History  . Marital status: Married    Spouse name: Marjory Lies  . Number of children: 1  . Years of education: Not on file  . Highest education level: Not on file  Occupational History  . Not on file  Tobacco Use  . Smoking status: Never Smoker  . Smokeless tobacco: Never Used  Vaping Use  . Vaping Use: Never used  Substance and Sexual Activity  . Alcohol use: Yes    Comment: ocassional - social 1- 2 drinks  . Drug use: No  . Sexual activity: Yes    Birth control/protection: I.U.D.  Other Topics Concern  . Not on file  Social History Narrative   Patient lives with husband and one child, a son named Max.  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    Social Determinants of Health   Financial Resource Strain: Not on file  Food Insecurity: Not on file  Transportation Needs: Not on file  Physical Activity: Not on file  Stress: Not on file  Social Connections: Not on file  Intimate Partner Violence: Not on file    Outpatient Medications Prior to Visit  Medication Sig Dispense Refill  . levonorgestrel (MIRENA) 20 MCG/24HR IUD Mirena 20 mcg/24 hours (5 yrs) 52 mg intrauterine device    . mometasone (NASONEX) 50 MCG/ACT nasal spray Nasonex    . Multiple Vitamins-Minerals (MULTIPLE VITAMINS/WOMENS  PO) Multiple Vitamin, Womens    . rizatriptan (MAXALT-MLT) 10 MG disintegrating tablet DISSOLVE 1 TABLET (10 MG TOTAL) BY MOUTH AS NEEDED. MAY REPEAT IN 2 HOURS IF NEEDED 15 tablet 0  . SUMAtriptan 6 MG/0.5ML SOAJ Inject 6mg  at onset of migraine.  May repeat in 2 hrs, if needed.  Max dose: 2 injections/day. This is a 30 day prescription. 10 Cartridge 5  . ondansetron (ZOFRAN ODT) 4 MG disintegrating tablet Take 1 tablet (4 mg total) by mouth every 8 (eight) hours as needed. 20 tablet 6  . promethazine (PHENERGAN) 12.5 MG tablet promethazine 12.5 mg tablet    . predniSONE (DELTASONE) 20 MG tablet 3 tabs x 1 day, 2 tabs x 2 days, 1 tab x 1 day 8 tablet 0   No facility-administered medications prior to visit.    Allergies  Allergen Reactions  . Diphenhydramine Hcl Hives and Other (See Comments)    Pt states she can take Dye free Benadryl  . Diphenhydramine Hcl Other (See Comments) and Hives    Pt states she can take Dye free Benadryl  . Iodinated Diagnostic Agents Shortness Of Breath, Nausea And Vomiting, Other (See Comments) and Anaphylaxis    Shortness of breath  . Chlorhexidine Gluconate Hives  . Codeine Hives, Nausea And Vomiting and Rash    ROS Review of Systems Per hpi     Objective:    Physical Exam Vitals and nursing note reviewed.  Constitutional:      General: She is not in acute distress.    Appearance: Normal appearance. She is normal weight. She is not ill-appearing, toxic-appearing or diaphoretic.  Cardiovascular:     Rate and Rhythm: Normal rate and regular rhythm.     Heart sounds: Normal heart sounds. No murmur heard. No friction rub. No gallop.   Pulmonary:     Effort: Pulmonary effort is normal. No respiratory distress.     Breath sounds: Normal breath sounds. No stridor. No wheezing, rhonchi or rales.  Chest:     Chest wall: No tenderness.  Skin:    General: Skin is warm and dry.  Neurological:     General: No focal deficit present.     Mental Status:  She is alert and oriented to person, place, and time. Mental status is at baseline.  Psychiatric:        Mood and Affect: Mood normal.        Behavior: Behavior normal.        Thought Content: Thought content normal.        Judgment: Judgment normal.     BP 125/75   Pulse 64   Temp 98 F (36.7 C) (Temporal)   Resp 18   Ht 5\' 7"  (1.702 m)   Wt 223 lb 3.2 oz (101.2 kg)   SpO2 100%   BMI 34.96 kg/m  Wt Readings from Last 3 Encounters:  12/08/20 220 lb 3.2 oz (99.9 kg)  10/15/20 223 lb 3.2 oz (101.2 kg)  12/21/19 205 lb 3.2 oz (93.1 kg)     Health Maintenance Due  Topic Date Due  . PAP SMEAR-Modifier  06/18/2020    There are no preventive care reminders to display for this patient.  Lab Results  Component Value Date   TSH 1.770 12/08/2020   Lab Results  Component Value Date   WBC 6.1 12/08/2020   HGB 15.8 12/08/2020   HCT 44.9 12/08/2020   MCV 84 12/08/2020   PLT 216 02/01/2018   Lab Results  Component Value Date   NA 140 12/08/2020   K 4.5 12/08/2020   CO2 21 12/08/2020   GLUCOSE 102 (H) 12/08/2020   BUN 14 12/08/2020   CREATININE 0.84 12/08/2020   BILITOT 0.4 12/08/2020   ALKPHOS 92 12/08/2020   AST 18 12/08/2020   ALT 24 12/08/2020   PROT 7.5 12/08/2020   ALBUMIN 4.5 12/08/2020   CALCIUM 9.4 12/08/2020   ANIONGAP 8 11/01/2017   Lab Results  Component Value Date   CHOL 140 12/08/2020   Lab Results  Component Value Date   HDL 39 (L) 12/08/2020   Lab Results  Component Value Date   LDLCALC 85 12/08/2020   Lab Results  Component Value Date   TRIG 80 12/08/2020   Lab Results  Component Value Date   CHOLHDL 3.6 12/08/2020   Lab Results  Component Value Date   HGBA1C 5.2 12/08/2020      Assessment & Plan:   Problem List Items Addressed This Visit      Other   Motion sickness - Primary   Relevant Medications   scopolamine (TRANSDERM-SCOP) 1 MG/3DAYS   ondansetron (ZOFRAN ODT) 4 MG disintegrating tablet   ALPRAZolam (NIRAVAM) 0.25  MG dissolvable tablet      Meds ordered this encounter  Medications  . scopolamine (TRANSDERM-SCOP) 1 MG/3DAYS    Sig: Place 1 patch (1.5 mg total) onto the skin every 3 (three) days.    Dispense:  10 patch    Refill:  12    Order Specific Question:   Supervising Provider    Answer:   Carlota Raspberry, JEFFREY R [[id number]]  . ondansetron (ZOFRAN ODT) 4 MG disintegrating tablet    Sig: Take 1 tablet (4 mg total) by mouth every 8 (eight) hours as needed.    Dispense:  20 tablet    Refill:  6    Order Specific Question:   Supervising Provider    Answer:   Carlota Raspberry, JEFFREY R [[id number]]  . ALPRAZolam (NIRAVAM) 0.25 MG dissolvable tablet    Sig: Take 1 tablet (0.25 mg total) by mouth at bedtime as needed for anxiety.    Dispense:  30 tablet    Refill:  0    Order Specific Question:   Supervising Provider    Answer:   Carlota Raspberry, JEFFREY R [[id number]]    Follow-up: No follow-ups on file.   PLAN  Scopolamine, ondansetron, and alprazolam in some combination for anti-dizziness. Discussed r/b/se of each.   Return prn  Patient encouraged to call clinic with any questions, comments, or concerns.  Maximiano Coss, NP

## 2021-02-17 DIAGNOSIS — L910 Hypertrophic scar: Secondary | ICD-10-CM | POA: Diagnosis not present

## 2021-02-17 DIAGNOSIS — D485 Neoplasm of uncertain behavior of skin: Secondary | ICD-10-CM | POA: Diagnosis not present

## 2021-02-17 DIAGNOSIS — D225 Melanocytic nevi of trunk: Secondary | ICD-10-CM | POA: Diagnosis not present

## 2021-04-14 ENCOUNTER — Encounter: Payer: Self-pay | Admitting: Registered Nurse

## 2021-04-15 ENCOUNTER — Other Ambulatory Visit: Payer: Self-pay

## 2021-04-15 DIAGNOSIS — T753XXA Motion sickness, initial encounter: Secondary | ICD-10-CM

## 2021-04-15 NOTE — Telephone Encounter (Signed)
   Morning Emma Mathews! I needed to get a refill on the scope patches if possible. I also thought the visit I had in January was a physical since I had labs done but it was not coded this way. Do I need to be seen again for a physical or is there something else that can be done. I submitted the paperwork to [facility] to verify for my premium but they rejected it. I get my GYN physical done elsewhere.   Thanks and hope you are well!   Steph LFD 10/15/20 #10 with 12/12 refills LOV 12/08/20 NOV 12/08/21

## 2021-04-17 ENCOUNTER — Other Ambulatory Visit (HOSPITAL_COMMUNITY): Payer: Self-pay

## 2021-04-17 MED ORDER — SCOPOLAMINE 1 MG/3DAYS TD PT72
MEDICATED_PATCH | TRANSDERMAL | 12 refills | Status: AC
  Filled 2021-04-17 – 2021-05-14 (×2): qty 10, 30d supply, fill #0

## 2021-04-17 NOTE — Addendum Note (Signed)
Addended by: Maximiano Coss on: 04/17/2021 08:41 AM   Modules accepted: Level of Service

## 2021-04-20 ENCOUNTER — Other Ambulatory Visit (HOSPITAL_COMMUNITY): Payer: Self-pay

## 2021-04-28 ENCOUNTER — Other Ambulatory Visit (HOSPITAL_COMMUNITY): Payer: Self-pay

## 2021-05-12 DIAGNOSIS — H52203 Unspecified astigmatism, bilateral: Secondary | ICD-10-CM | POA: Diagnosis not present

## 2021-05-12 DIAGNOSIS — H5213 Myopia, bilateral: Secondary | ICD-10-CM | POA: Diagnosis not present

## 2021-05-12 DIAGNOSIS — Z9889 Other specified postprocedural states: Secondary | ICD-10-CM | POA: Diagnosis not present

## 2021-05-14 ENCOUNTER — Other Ambulatory Visit (HOSPITAL_COMMUNITY): Payer: Self-pay

## 2021-05-25 ENCOUNTER — Telehealth: Payer: 59 | Admitting: Nurse Practitioner

## 2021-05-25 ENCOUNTER — Other Ambulatory Visit (HOSPITAL_COMMUNITY): Payer: Self-pay

## 2021-05-25 DIAGNOSIS — B379 Candidiasis, unspecified: Secondary | ICD-10-CM

## 2021-05-25 MED ORDER — FLUCONAZOLE 150 MG PO TABS
150.0000 mg | ORAL_TABLET | Freq: Once | ORAL | 0 refills | Status: AC
  Filled 2021-05-25: qty 2, 2d supply, fill #0

## 2021-05-25 NOTE — Progress Notes (Signed)
E-Visit for Vaginal Symptoms  We are sorry that you are not feeling well. Here is how we plan to help! Based on what you shared with me it looks like you: May have a yeast vaginosis  Vaginosis is an inflammation of the vagina that can result in discharge, itching and pain. The cause is usually a change in the normal balance of vaginal bacteria or an infection. Vaginosis can also result from reduced estrogen levels after menopause.   Yeast infections which are caused by a naturally occurring fungus called candida.   Vaginal atrophy (atrophic vaginosis) which results from the thinning of the vagina from reduced estrogen levels after menopause.   Trichomoniasis which is caused by a parasite and is commonly transmitted by sexual intercourse.  Factors that increase your risk of developing vaginosis include: Medications, such as antibiotics and steroids Uncontrolled diabetes Use of hygiene products such as bubble bath, vaginal spray or vaginal deodorant Douching Wearing damp or tight-fitting clothing Using an intrauterine device (IUD) for birth control Hormonal changes, such as those associated with pregnancy, birth control pills or menopause Sexual activity Having a sexually transmitted infection  Your treatment plan is A single Diflucan (fluconazole) 150mg  tablet once.  I have electronically sent this prescription into the pharmacy that you have chosen. If symptoms persist you can repeat the Diflucan tablet after 72 hours only as needed. If symptoms resolve prior to 72 hours after first tablet do not take second tablet.   Be sure to take all of the medication as directed. Stop taking any medication if you develop a rash, tongue swelling or shortness of breath. Mothers who are breast feeding should consider pumping and discarding their breast milk while on these antibiotics. However, there is no consensus that infant exposure at these doses would be harmful.  Remember that medication creams  can weaken latex condoms. Marland Kitchen   HOME CARE:  Good hygiene may prevent some types of vaginosis from recurring and may relieve some symptoms:  Avoid baths, hot tubs and whirlpool spas. Rinse soap from your outer genital area after a shower, and dry the area well to prevent irritation. Don't use scented or harsh soaps, such as those with deodorant or antibacterial action. Avoid irritants. These include scented tampons and pads. Wipe from front to back after using the toilet. Doing so avoids spreading fecal bacteria to your vagina.  Other things that may help prevent vaginosis include:  Don't douche. Your vagina doesn't require cleansing other than normal bathing. Repetitive douching disrupts the normal organisms that reside in the vagina and can actually increase your risk of vaginal infection. Douching won't clear up a vaginal infection. Use a latex condom. Both female and female latex condoms may help you avoid infections spread by sexual contact. Wear cotton underwear. Also wear pantyhose with a cotton crotch. If you feel comfortable without it, skip wearing underwear to bed. Yeast thrives in [employer] Your symptoms should improve in the next day or two.  GET HELP RIGHT AWAY IF:  You have pain in your lower abdomen ( pelvic area or over your ovaries) You develop nausea or vomiting You develop a fever Your discharge changes or worsens You have persistent pain with intercourse You develop shortness of breath, a rapid pulse, or you faint.  These symptoms could be signs of problems or infections that need to be evaluated by a medical provider now.  MAKE SURE YOU   Understand these instructions. Will watch your condition. Will get help right away if you  are not doing well or get worse.  Thank you for choosing an e-visit.  Your e-visit answers were reviewed by a board certified advanced clinical practitioner to complete your personal care plan. Depending upon the condition, your  plan could have included both over the counter or prescription medications.  Please review your pharmacy choice. Make sure the pharmacy is open so you can pick up prescription now. If there is a problem, you may contact your provider through [employer] and have the prescription routed to another pharmacy.  Your safety is important to [place]. If you have drug allergies check your prescription carefully.   For the next 24 hours you can use MyChart to ask questions about today's visit, request a non-urgent call back, or ask for a work or school excuse. You will get an email in the next two days asking about your experience. I hope that your e-visit has been valuable and will speed your recovery.   Meds ordered this encounter  Medications   fluconazole (DIFLUCAN) 150 MG tablet    Sig: Take 1 tablet (150 mg total) by mouth once for 1 dose. May repeat in 72 hours as needed if symptoms persist    Dispense:  2 tablet    Refill:  0     I spent approximately 7 minutes reviewing the patient's history, current symptoms and coordinating their plan of care today.

## 2021-05-28 DIAGNOSIS — L820 Inflamed seborrheic keratosis: Secondary | ICD-10-CM | POA: Diagnosis not present

## 2021-05-28 DIAGNOSIS — L910 Hypertrophic scar: Secondary | ICD-10-CM | POA: Diagnosis not present

## 2021-07-15 DIAGNOSIS — M9903 Segmental and somatic dysfunction of lumbar region: Secondary | ICD-10-CM | POA: Diagnosis not present

## 2021-07-15 DIAGNOSIS — M722 Plantar fascial fibromatosis: Secondary | ICD-10-CM | POA: Diagnosis not present

## 2021-07-15 DIAGNOSIS — M9906 Segmental and somatic dysfunction of lower extremity: Secondary | ICD-10-CM | POA: Diagnosis not present

## 2021-07-15 DIAGNOSIS — M9905 Segmental and somatic dysfunction of pelvic region: Secondary | ICD-10-CM | POA: Diagnosis not present

## 2021-07-24 ENCOUNTER — Encounter: Payer: Self-pay | Admitting: Registered Nurse

## 2021-07-24 ENCOUNTER — Other Ambulatory Visit: Payer: Self-pay

## 2021-07-24 ENCOUNTER — Other Ambulatory Visit (HOSPITAL_COMMUNITY): Payer: Self-pay

## 2021-07-24 ENCOUNTER — Telehealth (INDEPENDENT_AMBULATORY_CARE_PROVIDER_SITE_OTHER): Payer: 59 | Admitting: Registered Nurse

## 2021-07-24 DIAGNOSIS — F418 Other specified anxiety disorders: Secondary | ICD-10-CM | POA: Diagnosis not present

## 2021-07-24 MED ORDER — HYDROXYZINE HCL 10 MG PO TABS
5.0000 mg | ORAL_TABLET | Freq: Three times a day (TID) | ORAL | 0 refills | Status: DC | PRN
  Filled 2021-07-24: qty 30, 10d supply, fill #0

## 2021-07-24 MED ORDER — TRAZODONE HCL 50 MG PO TABS
25.0000 mg | ORAL_TABLET | Freq: Every evening | ORAL | 3 refills | Status: DC | PRN
  Filled 2021-07-24: qty 30, 30d supply, fill #0
  Filled 2021-09-03: qty 30, 30d supply, fill #1

## 2021-07-24 MED ORDER — FLUOXETINE HCL 20 MG PO CAPS
20.0000 mg | ORAL_CAPSULE | Freq: Every day | ORAL | 0 refills | Status: DC
  Filled 2021-07-24: qty 90, 90d supply, fill #0

## 2021-07-24 NOTE — Progress Notes (Signed)
Telemedicine Encounter- SOAP NOTE Established Patient  This telephone encounter was conducted with the patient's (or proxy's) verbal consent via audio telecommunications: yes/no: Yes Patient was instructed to have this encounter in a suitably private space; and to only have persons present to whom they give permission to participate. In addition, patient identity was confirmed by use of name plus two identifiers (DOB and address).  I discussed the limitations, risks, security and privacy concerns of performing an evaluation and management service by telephone and the availability of in person appointments. I also discussed with the patient that there may be a patient responsible charge related to this service. The patient expressed understanding and agreed to proceed.  I spent a total of 32 minutes talking with the patient or their proxy.  Patient at home Provider in office  Participants: Kathrin Ruddy, NP and Jaynie Bream  Chief Complaint  Patient presents with   Anxiety    Patient has been having some anxiety since son went to college and her dad is having some health issues and he lives 3 hours away. She's been trying to manage it from here. This has caused her to itch all over her body, doesn't have presence of hives and her hair is falling out.     Subjective   Emma Mathews is a 45 y.o. established patient. Telephone visit today for Anxiety  HPI Son went to college - first time - he is only child - she is experiencing ups and downs with him gone, and he's had some challenges there.   Parents - health issues - father with CHF but deteriorating, other issues. Unfortunately they are not "good patients" and communicate in limited ways with providers and have disconnects in care. Mother is deaf, concern for early dementia. They are located about 3.5 hrs drive away.  She has experienced anxiety with physical manifestations of heart racing, itching, hair thinning. Has taken  cooler showers, used cortisone without relief  Patient Active Problem List   Diagnosis Date Noted   Acquired equinus deformity of left foot 01/08/2020   Disorder of Eustachian tube, right 12/21/2019   Low back pain 10/26/2019   Plantar fasciitis, left 06/05/2019   Chronic migraine with aura 03/27/2018   Obesity 02/01/2018   Headache 02/01/2018   Motion sickness 02/01/2018   History of migraine 02/01/2018   Degenerative TFCC tear, right 10/25/2017   ADD 10/28/2009   ANEMIA-UNSPECIFIED 04/28/2009   ALLERGIC RHINITIS 02/25/2009    Past Medical History:  Diagnosis Date   Appendicitis, acute 10/02/2013   Cancer ([place])    melanoma excision in 2013   Cholecystitis    Complication of anesthesia    slow to wake up   Headache    migraine   Mammographic breast lesion 06/28/2017   S/p biopsy, benign    Pneumonia 2010   PONV (postoperative nausea and vomiting)    Seasonal allergies    STRESS FRACTURE, FOOT 04/08/2009   Qualifier: Diagnosis of  By: Arnoldo Morale MD, Balinda Quails    Vision abnormalities     Current Outpatient Medications  Medication Sig Dispense Refill   cetirizine (ZYRTEC) 10 MG tablet Take 10 mg by mouth daily.     FLUoxetine (PROZAC) 20 MG capsule Take 1 capsule (20 mg total) by mouth daily. 90 capsule 0   hydrOXYzine (ATARAX/VISTARIL) 10 MG tablet Take 0.5-1 tablets (5-10 mg total) by mouth 3 (three) times daily as needed. 30 tablet 0   levonorgestrel (MIRENA) 20 MCG/24HR IUD Mirena  20 mcg/24 hours (5 yrs) 52 mg intrauterine device     methocarbamol (ROBAXIN) 750 MG tablet methocarbamol 750 mg tablet     mometasone (NASONEX) 50 MCG/ACT nasal spray Nasonex     Multiple Vitamins-Minerals (MULTIPLE VITAMINS/WOMENS PO) Multiple Vitamin, Womens     ondansetron (ZOFRAN-ODT) 4 MG disintegrating tablet DISSOLVE ONE TABLET (4 MG TOTAL) BY MOUTH EVERY 8 (EIGHT) HOURS AS NEEDED. 20 tablet 6   promethazine (PHENERGAN) 12.5 MG tablet promethazine 12.5 mg tablet     rizatriptan  (MAXALT-MLT) 10 MG disintegrating tablet DISSOLVE 1 TABLET (10 MG TOTAL) BY MOUTH AS NEEDED. MAY REPEAT IN 2 HOURS IF NEEDED 15 tablet 0   scopolamine (TRANSDERM-SCOP) 1 MG/3DAYS PLACE 1 PATCH ONTO THE SKIN EVERY 3 DAYS. 10 patch 12   SUMAtriptan 6 MG/0.5ML SOAJ Inject '6mg'$  at onset of migraine.  May repeat in 2 hrs, if needed.  Max dose: 2 injections/day. This is a 30 day prescription. 10 Cartridge 5   traZODone (DESYREL) 50 MG tablet Take 0.5-1 tablets (25-50 mg total) by mouth at bedtime as needed for sleep. 30 tablet 3   celecoxib (CELEBREX) 200 MG capsule TAKE 1 CAPSULE BY MOUTH ONCE A DAY WITH FOOD FOR PAIN AND SWELLING (Patient not taking: Reported on 07/24/2021) 30 capsule 3   No current facility-administered medications for this visit.    Allergies  Allergen Reactions   Diphenhydramine Hcl Hives and Other (See Comments)    Pt states she can take Dye free Benadryl   Diphenhydramine Hcl Other (See Comments) and Hives    Pt states she can take Dye free Benadryl   Iodinated Diagnostic Agents Shortness Of Breath, Nausea And Vomiting, Other (See Comments) and Anaphylaxis    Shortness of breath   Chlorhexidine Gluconate Hives   Codeine Hives, Nausea And Vomiting and Rash    Social History   Socioeconomic History   Marital status: Married    Spouse name: Marjory Lies   Number of children: 1   Years of education: Not on file   Highest education level: Not on file  Occupational History   Not on file  Tobacco Use   Smoking status: Never   Smokeless tobacco: Never  Vaping Use   Vaping Use: Never used  Substance and Sexual Activity   Alcohol use: Yes    Comment: ocassional - social 1- 2 drinks   Drug use: No   Sexual activity: Yes    Birth control/protection: I.U.D.  Other Topics Concern   Not on file  Social History Narrative   Patient lives with husband and one child, a son named Max.    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    Social  Determinants of Health   Financial Resource Strain: Not on file  Food Insecurity: Not on file  Transportation Needs: Not on file  Physical Activity: Not on file  Stress: Not on file  Social Connections: Not on file  Intimate Partner Violence: Not on file    Review of Systems  Constitutional: Negative.   HENT: Negative.    Eyes: Negative.   Respiratory: Negative.    Cardiovascular: Negative.   Gastrointestinal: Negative.   Genitourinary: Negative.   Musculoskeletal: Negative.   Skin: Negative.   Neurological: Negative.   Endo/Heme/Allergies: Negative.   Psychiatric/Behavioral:  Negative for depression, hallucinations, memory loss, substance abuse and suicidal ideas. The patient is nervous/anxious. The patient does not have insomnia.   All other systems reviewed and are negative.  Objective  Vitals as reported by the patient: There were no vitals filed for this visit.  Cyanni was seen today for anxiety.  Diagnoses and all orders for this visit:  Situational anxiety -     FLUoxetine (PROZAC) 20 MG capsule; Take 1 capsule (20 mg total) by mouth daily. -     traZODone (DESYREL) 50 MG tablet; Take 0.5-1 tablets (25-50 mg total) by mouth at bedtime as needed for sleep. -     hydrOXYzine (ATARAX/VISTARIL) 10 MG tablet; Take 0.5-1 tablets (5-10 mg total) by mouth 3 (three) times daily as needed.   PLAN Start fluoxetine '20mg'$  po qd. Med check in 5-6 weeks PRNs - hydroxyzine and trazodone as above. Given suggestion for counseling and other suggestions that may help  Patient encouraged to call clinic with any questions, comments, or concerns.   I discussed the assessment and treatment plan with the patient. The patient was provided an opportunity to ask questions and all were answered. The patient agreed with the plan and demonstrated an understanding of the instructions.   The patient was advised to call back or seek an in-person evaluation if the symptoms worsen or if the  condition fails to improve as anticipated.  I provided 32 minutes of non-face-to-face time during this encounter.  Maximiano Coss, NP  Primary [facility] at [facility]

## 2021-07-24 NOTE — Patient Instructions (Addendum)
Ms. Marceil Welp to speak with you -   A few thoughts: Home health - med management, monitoring status, ensuring plan is followed. I know it may be met with resistance, but worth planting seeds [facility] - [email or web address].medminder.[email or web address] - useful for ensuring they are getting the right meds on the right days. They run their own pharmacy and will alert you if there are any discrepancies. If you can get access to their patient portals, that will help you keep a close eye on their status and even reach out to their providers    Start fluoxetine 73m daily. Must take every day for effect Take hydroxyzine 5-11mthree times daily as needed for breakthrough anxiety Take trazodone 25-5050mt night as needed for sleep I would recommend contacting [facility] for counseling - a great program, free, and it's not [facility] employees - they contract out through other providers!  Let's touch base in 5-6 weeks  Thank you  Rich

## 2021-09-01 ENCOUNTER — Encounter: Payer: Self-pay | Admitting: *Deleted

## 2021-09-03 ENCOUNTER — Other Ambulatory Visit (HOSPITAL_COMMUNITY): Payer: Self-pay

## 2021-09-03 ENCOUNTER — Telehealth: Payer: 59 | Admitting: Physician Assistant

## 2021-09-03 DIAGNOSIS — B9689 Other specified bacterial agents as the cause of diseases classified elsewhere: Secondary | ICD-10-CM | POA: Diagnosis not present

## 2021-09-03 DIAGNOSIS — J069 Acute upper respiratory infection, unspecified: Secondary | ICD-10-CM | POA: Diagnosis not present

## 2021-09-03 MED ORDER — BENZONATATE 100 MG PO CAPS
100.0000 mg | ORAL_CAPSULE | Freq: Three times a day (TID) | ORAL | 0 refills | Status: DC | PRN
  Filled 2021-09-03: qty 30, 10d supply, fill #0

## 2021-09-03 MED ORDER — AZITHROMYCIN 250 MG PO TABS
ORAL_TABLET | ORAL | 0 refills | Status: AC
  Filled 2021-09-03: qty 6, 5d supply, fill #0

## 2021-09-03 NOTE — Progress Notes (Signed)
I have spent 5 minutes in review of e-visit questionnaire, review and updating patient chart, medical decision making and response to patient.   Tanya Marvin Cody Jakori Burkett, PA-C    

## 2021-09-03 NOTE — Progress Notes (Signed)

## 2021-09-06 ENCOUNTER — Other Ambulatory Visit: Payer: Self-pay

## 2021-09-06 ENCOUNTER — Emergency Department (INDEPENDENT_AMBULATORY_CARE_PROVIDER_SITE_OTHER): Payer: 59

## 2021-09-06 ENCOUNTER — Emergency Department (INDEPENDENT_AMBULATORY_CARE_PROVIDER_SITE_OTHER)
Admission: RE | Admit: 2021-09-06 | Discharge: 2021-09-06 | Disposition: A | Discharge status: Home / Self Care | Payer: 59 | Source: Ambulatory Visit

## 2021-09-06 VITALS — BP 102/80 | HR 80 | Temp 98.2°F | Resp 18

## 2021-09-06 DIAGNOSIS — R059 Cough, unspecified: Secondary | ICD-10-CM

## 2021-09-06 DIAGNOSIS — R5383 Other fatigue: Secondary | ICD-10-CM

## 2021-09-06 DIAGNOSIS — R0981 Nasal congestion: Secondary | ICD-10-CM

## 2021-09-06 MED ORDER — FEXOFENADINE HCL 180 MG PO TABS: 180.0000 mg | ORAL_TABLET | Freq: Every day | ORAL | 0 refills | Status: DC

## 2021-09-06 MED ORDER — PREDNISONE 20 MG PO TABS: ORAL_TABLET | ORAL | 0 refills | Status: DC

## 2021-09-06 MED ORDER — PROMETHAZINE-DM 6.25-15 MG/5ML PO SYRP: 5.0000 mL | ORAL_SOLUTION | Freq: Two times a day (BID) | ORAL | 0 refills | Status: DC | PRN

## 2021-09-06 NOTE — Discharge Instructions (Addendum)
Advised patient to take medication as directed with food to completion.  Advised patient to hold Nasonex discontinue Zyrtec and take Allegra for the next 5 to 7 days for concurrent postnasal drainage/drip, then as needed afterwards.  Advised may use Promethazine-DM cough syrup prior to sleep; however, do not take at same time with Tessalon Perles.  Encourage patient increase daily water intake while taking these medications.

## 2021-09-06 NOTE — ED Triage Notes (Signed)
Patient presents to Urgent Care with complaints of hoarseness since 1 week ago. Patient reports nasal congestion-brownish. Negative home covid test. Fever started, wheezing, cracking, cough keeping her up at night. Did a virtual visit- prescribed Z-pak and tessalon pearls. Now coughing up brownish sputum, wheezing, panting, fatigued/tired, burning sensation with coughing.  Has been out of work 1 week. Coughing spells continue.

## 2021-09-06 NOTE — ED Provider Notes (Signed)
[facility]    CSN: [id number] Arrival date & time: 09/06/21  1259      History   Chief Complaint Chief Complaint  Patient presents with   Cough   Wheezing    HPI Emma Mathews is a 45 y.o. female.   HPI 45 year old female presents with cough and wheeze for 1 week reports was recently prescribed Zithromax on 10/20.  Now coughing up brownish sputum, wheezing, panting, fatigue, tired, burning sensation.  Past Medical History:  Diagnosis Date   Appendicitis, acute 10/02/2013   Cancer ([place])    melanoma excision in 2013   Cholecystitis    Complication of anesthesia    slow to wake up   Headache    migraine   Mammographic breast lesion 06/28/2017   S/p biopsy, benign    Pneumonia 2010   PONV (postoperative nausea and vomiting)    Seasonal allergies    STRESS FRACTURE, FOOT 04/08/2009   Qualifier: Diagnosis of  By: Arnoldo Morale MD, Balinda Quails    Vision abnormalities     Patient Active Problem List   Diagnosis Date Noted   Acquired equinus deformity of left foot 01/08/2020   Disorder of Eustachian tube, right 12/21/2019   Low back pain 10/26/2019   Plantar fasciitis, left 06/05/2019   Chronic migraine with aura 03/27/2018   Obesity 02/01/2018   Headache 02/01/2018   Motion sickness 02/01/2018   History of migraine 02/01/2018   Degenerative TFCC tear, right 10/25/2017   ADD 10/28/2009   ANEMIA-UNSPECIFIED 04/28/2009   ALLERGIC RHINITIS 02/25/2009    Past Surgical History:  Procedure Laterality Date   APPENDECTOMY     CHOLECYSTECTOMY     EYE SURGERY Bilateral    PRK   Labrum Repair Right 2000   Open   LAPAROSCOPIC APPENDECTOMY N/A 09/02/2013   Procedure: APPENDECTOMY LAPAROSCOPIC;  Surgeon: Harl Bowie, MD;  Location: [facility];  Service: General;  Laterality: N/A;   SHOULDER ARTHROSCOPY Left 2005   bone spurs    SHOULDER ARTHROSCOPY Right 11/01/2017   Procedure: RIGHT SHOULDER ARTHROSCOPY WITH SUBACROMIAL DECOMPRESSION;  Surgeon: Meredith Pel, MD;  Location: [facility];  Service: Orthopedics;  Laterality: Right;   SHOULDER ARTHROSCOPY WITH SUBACROMIAL DECOMPRESSION, ROTATOR CUFF REPAIR AND BICEP TENDON REPAIR Right 02/24/2016   Procedure: RIGHT SHOULDER ARTHROSCOPY WITH DEBRIDEMENT, BICEPS TENODESIS;  Surgeon: Meredith Pel, MD;  Location: [facility];  Service: Orthopedics;  Laterality: Right;   SHOULDER SURGERY Right 2007   Revision and repair flap   WISDOM TOOTH EXTRACTION     WRIST ARTHROSCOPY Right 11/09/2017   Procedure: ARTHROSCOPY WRIST WITH DEBRIDEMENT;  Surgeon: Charlotte Crumb, MD;  Location: [facility];  Service: Orthopedics;  Laterality: Right;    OB History   No obstetric history on file.      Home Medications    Prior to Admission medications   Medication Sig Start Date End Date Taking? Authorizing Provider  azithromycin (ZITHROMAX) 250 MG tablet Take 2 tablets by mouth on day 1, then take 1 tablet daily on days 2 through 5 09/03/21 09/08/21 Yes Brunetta Jeans, PA-C  benzonatate (TESSALON) 100 MG capsule Take 1 capsule (100 mg total) by mouth 3 (three) times daily as needed for cough. 09/03/21  Yes Brunetta Jeans, PA-C  cetirizine (ZYRTEC) 10 MG tablet Take 10 mg by mouth daily.   Yes [provider]  fexofenadine (ALLEGRA ALLERGY) 180 MG tablet Take 1 tablet (180 mg total) by mouth daily for 15 days. 09/06/21  09/21/21 Yes Eliezer Lofts, FNP  FLUoxetine (PROZAC) 20 MG capsule Take 1 capsule (20 mg total) by mouth daily. 07/24/21  Yes Maximiano Coss, NP  hydrOXYzine (ATARAX/VISTARIL) 10 MG tablet Take 1/2 to 1 tablet (5-10 mg total) by mouth 3 (three) times daily as needed. 07/24/21  Yes Maximiano Coss, NP  levonorgestrel (MIRENA) 20 MCG/24HR IUD Mirena 20 mcg/24 hours (5 yrs) 52 mg intrauterine device   Yes [provider]  methocarbamol (ROBAXIN) 750 MG tablet methocarbamol 750 mg tablet   Yes [provider]  mometasone (NASONEX) 50 MCG/ACT nasal spray Nasonex    Yes [provider]  Multiple Vitamins-Minerals (MULTIPLE VITAMINS/WOMENS PO) Multiple Vitamin, Womens   Yes [provider]  ondansetron (ZOFRAN-ODT) 4 MG disintegrating tablet DISSOLVE ONE TABLET (4 MG TOTAL) BY MOUTH EVERY 8 (EIGHT) HOURS AS NEEDED. 10/15/20 10/15/21 Yes Maximiano Coss, NP  predniSONE (DELTASONE) 20 MG tablet Take 3 tabs PO daily x 3 days, then 2 tabs PO daily x 3 days, then 1 tab PO daily x 3 days 09/06/21  Yes Eliezer Lofts, FNP  promethazine (PHENERGAN) 12.5 MG tablet promethazine 12.5 mg tablet   Yes [provider]  promethazine-dextromethorphan (PROMETHAZINE-DM) 6.25-15 MG/5ML syrup Take 5 mLs by mouth 2 (two) times daily as needed for cough. 09/06/21  Yes Eliezer Lofts, FNP  rizatriptan (MAXALT-MLT) 10 MG disintegrating tablet DISSOLVE 1 TABLET (10 MG TOTAL) BY MOUTH AS NEEDED. MAY REPEAT IN 2 HOURS IF NEEDED 09/12/19  Yes Marcial Pacas, MD  scopolamine (TRANSDERM-SCOP) 1 MG/3DAYS PLACE 1 PATCH ONTO THE SKIN EVERY 3 DAYS. 04/17/21 04/17/22 Yes Maximiano Coss, NP  SUMAtriptan 6 MG/0.5ML SOAJ Inject 6mg  at onset of migraine.  May repeat in 2 hrs, if needed.  Max dose: 2 injections/day. This is a 30 day prescription. 03/31/18  Yes Marcial Pacas, MD  traZODone (DESYREL) 50 MG tablet Take 1/2 to 1 tablet (25-50 mg total) by mouth at bedtime as needed for sleep. 07/24/21  Yes Maximiano Coss, NP  celecoxib (CELEBREX) 200 MG capsule TAKE 1 CAPSULE BY MOUTH ONCE A DAY WITH FOOD FOR PAIN AND SWELLING 10/13/20 10/13/21  Shepperson, Kirstin, PA-C    Family History Family History  Adopted: Yes  Family history unknown: Yes    Social History Social History   Tobacco Use   Smoking status: Never   Smokeless tobacco: Never  Vaping Use   Vaping Use: Never used  Substance Use Topics   Alcohol use: Yes    Comment: ocassional - social 1- 2 drinks   Drug use: No     Allergies   Diphenhydramine hcl, Diphenhydramine hcl, Iodinated diagnostic agents, Chlorhexidine  gluconate, and Codeine   Review of Systems Review of Systems  Constitutional:  Positive for fatigue and fever.  HENT:  Positive for sore throat.   Respiratory:  Positive for cough.   All other systems reviewed and are negative.   Physical Exam Triage Vital Signs ED Triage Vitals  Enc Vitals Group     BP 09/06/21 1349 102/80     Pulse Rate 09/06/21 1349 80     Resp 09/06/21 1349 18     Temp 09/06/21 1349 98.2 F (36.8 C)     Temp Source 09/06/21 1349 Oral     SpO2 09/06/21 1349 100 %     Weight --      Height --      Head Circumference --      Peak Flow --      Pain Score 09/06/21 1345 5  Pain Loc --      Pain Edu? --      Excl. in [place]? --    No data found.  Updated Vital Signs BP 102/80 (BP Location: Right Arm)   Pulse 80   Temp 98.2 F (36.8 C) (Oral)   Resp 18   SpO2 100%       Physical Exam Vitals and nursing note reviewed.  Constitutional:      General: She is not in acute distress.    Appearance: Normal appearance. She is obese. She is not ill-appearing.  HENT:     Head: Normocephalic and atraumatic.     Right Ear: Tympanic membrane, ear canal and external ear normal.     Left Ear: Tympanic membrane, ear canal and external ear normal.     Nose: Nose normal.     Mouth/Throat:     Mouth: Mucous membranes are moist.     Pharynx: Oropharynx is clear.     Comments: Moderate amount of clear drainage of posterior oropharynx noted Eyes:     Extraocular Movements: Extraocular movements intact.     Conjunctiva/sclera: Conjunctivae normal.     Pupils: Pupils are equal, round, and reactive to light.  Cardiovascular:     Rate and Rhythm: Normal rate and regular rhythm.     Pulses: Normal pulses.     Heart sounds: Normal heart sounds.  Pulmonary:     Effort: Pulmonary effort is normal.     Breath sounds: Normal breath sounds. No wheezing, rhonchi or rales.     Comments: Frequent nonproductive cough noted on exam Musculoskeletal:        General: Normal  range of motion.     Cervical back: Normal range of motion and neck supple.  Skin:    General: Skin is warm and dry.  Neurological:     General: No focal deficit present.     Mental Status: She is alert and oriented to person, place, and time.     UC Treatments / Results  Labs (all labs ordered are listed, but only abnormal results are displayed) Labs Reviewed  COVID-19, FLU A+B NAA    EKG   Radiology DG Chest 2 View  Result Date: 09/06/2021 CLINICAL DATA:  Cough for 11 days.  Congestion. EXAM: CHEST - 2 VIEW COMPARISON:  Radiographs 11/16/2016. FINDINGS: The heart size and mediastinal contours are normal. The lungs are clear. There is no pleural effusion or pneumothorax. No acute osseous findings are identified. Postsurgical changes at the right shoulder and mild degenerative changes in the spine. IMPRESSION: Stable chest.  No active cardiopulmonary process. Electronically Signed   By: Richardean Sale M.D.   On: 09/06/2021 15:05    Procedures Procedures (including critical care time)  Medications Ordered in UC Medications - No data to display  Initial Impression / Assessment and Plan / UC Course  I have reviewed the triage vital signs and the nursing notes.  Pertinent labs & imaging results that were available during my care of the patient were reviewed by me and considered in my medical decision making (see chart for details).     MDM: 1.  Cough-CXR revealed (above) Rx'd permethrin DM advised patient not to use permethrin DM and Tessalon Perles together.; 2.  Other fatigue-COVID-19 flu A/B ordered; 3.  Allergic rhinitis-Rx'd Allegra. Advised patient to take medication as directed with food to completion.  Advised patient to hold Nasonex discontinue Zyrtec and take Allegra for the next 5 to 7 days for concurrent postnasal  drainage/drip, then as needed afterwards.  Advised may use Promethazine-DM cough syrup prior to sleep; however, do not take at same time with Tessalon  Perles.  Encourage patient increase daily water intake while taking these medications.  Patient discharged home, hemodynamically stable Final Clinical Impressions(s) / UC Diagnoses   Final diagnoses:  Cough, unspecified type  Other fatigue     Discharge Instructions      Advised patient to take medication as directed with food to completion.  Advised patient to hold Nasonex discontinue Zyrtec and take Allegra for the next 5 to 7 days for concurrent postnasal drainage/drip, then as needed afterwards.  Advised may use Promethazine-DM cough syrup prior to sleep; however, do not take at same time with Tessalon Perles.  Encourage patient increase daily water intake while taking these medications.     ED Prescriptions     Medication Sig Dispense Auth. Provider   fexofenadine (ALLEGRA ALLERGY) 180 MG tablet Take 1 tablet (180 mg total) by mouth daily for 15 days. 15 tablet Eliezer Lofts, FNP   predniSONE (DELTASONE) 20 MG tablet Take 3 tabs PO daily x 3 days, then 2 tabs PO daily x 3 days, then 1 tab PO daily x 3 days 18 tablet Eliezer Lofts, FNP   promethazine-dextromethorphan (PROMETHAZINE-DM) 6.25-15 MG/5ML syrup Take 5 mLs by mouth 2 (two) times daily as needed for cough. 118 mL Eliezer Lofts, FNP      PDMP not reviewed this encounter.   Eliezer Lofts, [place] 09/06/21 1526

## 2021-09-08 LAB — COVID-19, FLU A+B NAA
Influenza A, NAA: NOT DETECTED
Influenza B, NAA: NOT DETECTED
SARS-CoV-2, NAA: NOT DETECTED

## 2021-09-26 ENCOUNTER — Telehealth: Payer: 59 | Admitting: Nurse Practitioner

## 2021-09-26 ENCOUNTER — Other Ambulatory Visit (HOSPITAL_COMMUNITY): Payer: Self-pay

## 2021-09-26 DIAGNOSIS — J208 Acute bronchitis due to other specified organisms: Secondary | ICD-10-CM | POA: Diagnosis not present

## 2021-09-26 MED ORDER — PSEUDOEPH-BROMPHEN-DM 30-2-10 MG/5ML PO SYRP
5.0000 mL | ORAL_SOLUTION | Freq: Four times a day (QID) | ORAL | 0 refills | Status: DC | PRN
  Filled 2021-09-26: qty 120, 6d supply, fill #0

## 2021-09-26 MED ORDER — ALBUTEROL SULFATE HFA 108 (90 BASE) MCG/ACT IN AERS
2.0000 | INHALATION_SPRAY | Freq: Four times a day (QID) | RESPIRATORY_TRACT | 2 refills | Status: DC | PRN
  Filled 2021-09-26: qty 8.5, 25d supply, fill #0

## 2021-09-26 NOTE — Progress Notes (Signed)
I have spent 5 minutes in review of e-visit questionnaire, review and updating patient chart, medical decision making and response to patient.  ° °Dustine Bertini W Annabel Gibeau, NP ° °  °

## 2021-09-26 NOTE — Progress Notes (Signed)
We are sorry that you are not feeling well.  Here is how we plan to help!   Based on your presentation I believe you most likely have A cough due to a virus.  This is called viral bronchitis and is best treated by rest, plenty of fluids and control of the cough.  You may use Ibuprofen or Tylenol as directed to help your symptoms.     In addition you may use a prescription cough syrup called promethazine and an inhaler that has been sent to the pharmacy    From your responses in the eVisit questionnaire you describe inflammation in the upper respiratory tract which is causing a significant cough.  This is commonly called Bronchitis and has four common causes:   Allergies Viral Infections Acid Reflux Bacterial Infection Allergies, viruses and acid reflux are treated by controlling symptoms or eliminating the cause. An example might be a cough caused by taking certain blood pressure medications. You stop the cough by changing the medication. Another example might be a cough caused by acid reflux. Controlling the reflux helps control the cough.   USE OF BRONCHODILATOR ("RESCUE") INHALERS: There is a risk from using your bronchodilator too frequently.  The risk is that over-reliance on a medication which only relaxes the muscles surrounding the breathing tubes can reduce the effectiveness of medications prescribed to reduce swelling and congestion of the tubes themselves.  Although you feel brief relief from the bronchodilator inhaler, your asthma may actually be worsening with the tubes becoming more swollen and filled with mucus.  This can delay other crucial treatments, such as oral steroid medications. If you need to use a bronchodilator inhaler daily, several times per day, you should discuss this with your provider.  There are probably better treatments that could be used to keep your asthma under control.                HOME CARE Only take medications as instructed by your medical team. Complete  the entire course of an antibiotic. Drink plenty of fluids and get plenty of rest. Avoid close contacts especially the very young and the elderly Cover your mouth if you cough or cough into your sleeve. Always remember to wash your hands A steam or ultrasonic humidifier can help congestion.    GET HELP RIGHT AWAY IF: You develop worsening fever. You become short of breath You cough up blood. Your symptoms persist after you have completed your treatment plan MAKE SURE YOU  Understand these instructions. Will watch your condition. Will get help right away if you are not doing well or get worse.     Thank you for choosing an e-visit.   Your e-visit answers were reviewed by a board certified advanced clinical practitioner to complete your personal care plan. Depending upon the condition, your plan could have included both over the counter or prescription medications.   Please review your pharmacy choice. Make sure the pharmacy is open so you can pick up prescription now. If there is a problem, you may contact your provider through [employer] and have the prescription routed to another pharmacy.  Your safety is important to [place]. If you have drug allergies check your prescription carefully.    For the next 24 hours you can use MyChart to ask questions about today's visit, request a non-urgent call back, or ask for a work or school excuse. You will get an email in the next two days asking about your experience. I hope that your  e-visit has been valuable and will speed your recovery.

## 2021-10-07 ENCOUNTER — Other Ambulatory Visit (HOSPITAL_COMMUNITY): Payer: Self-pay

## 2021-11-11 ENCOUNTER — Other Ambulatory Visit: Payer: Self-pay | Admitting: Registered Nurse

## 2021-11-11 ENCOUNTER — Other Ambulatory Visit (HOSPITAL_COMMUNITY): Payer: Self-pay

## 2021-11-11 DIAGNOSIS — F418 Other specified anxiety disorders: Secondary | ICD-10-CM

## 2021-11-11 MED ORDER — FLUOXETINE HCL 20 MG PO CAPS
20.0000 mg | ORAL_CAPSULE | Freq: Every day | ORAL | 3 refills | Status: DC
  Filled 2021-11-11: qty 90, 90d supply, fill #0
  Filled 2022-02-12: qty 90, 90d supply, fill #1
  Filled 2022-04-07 – 2022-04-16 (×3): qty 90, 90d supply, fill #2

## 2021-11-25 ENCOUNTER — Other Ambulatory Visit (HOSPITAL_COMMUNITY): Payer: Self-pay

## 2021-12-08 ENCOUNTER — Ambulatory Visit: Payer: Self-pay | Admitting: Registered Nurse

## 2022-01-04 DIAGNOSIS — M9903 Segmental and somatic dysfunction of lumbar region: Secondary | ICD-10-CM | POA: Diagnosis not present

## 2022-01-04 DIAGNOSIS — M722 Plantar fascial fibromatosis: Secondary | ICD-10-CM | POA: Diagnosis not present

## 2022-01-04 DIAGNOSIS — M9905 Segmental and somatic dysfunction of pelvic region: Secondary | ICD-10-CM | POA: Diagnosis not present

## 2022-01-04 DIAGNOSIS — M9906 Segmental and somatic dysfunction of lower extremity: Secondary | ICD-10-CM | POA: Diagnosis not present

## 2022-01-12 ENCOUNTER — Encounter: Payer: Self-pay | Admitting: Registered Nurse

## 2022-01-12 ENCOUNTER — Other Ambulatory Visit: Payer: Self-pay

## 2022-01-12 ENCOUNTER — Ambulatory Visit (INDEPENDENT_AMBULATORY_CARE_PROVIDER_SITE_OTHER): Payer: 59 | Admitting: Registered Nurse

## 2022-01-12 VITALS — BP 114/56 | HR 62 | Temp 97.8°F | Resp 18 | Ht 67.0 in | Wt 220.0 lb

## 2022-01-12 DIAGNOSIS — Z130 Encounter for screening for diseases of the blood and blood-forming organs and certain disorders involving the immune mechanism: Secondary | ICD-10-CM | POA: Diagnosis not present

## 2022-01-12 DIAGNOSIS — Z13228 Encounter for screening for other metabolic disorders: Secondary | ICD-10-CM

## 2022-01-12 DIAGNOSIS — Z1329 Encounter for screening for other suspected endocrine disorder: Secondary | ICD-10-CM

## 2022-01-12 DIAGNOSIS — Z13220 Encounter for screening for lipoid disorders: Secondary | ICD-10-CM | POA: Diagnosis not present

## 2022-01-12 DIAGNOSIS — Z1159 Encounter for screening for other viral diseases: Secondary | ICD-10-CM

## 2022-01-12 LAB — COMPREHENSIVE METABOLIC PANEL
ALT: 19 U/L (ref 0–35)
AST: 16 U/L (ref 0–37)
Albumin: 4.4 g/dL (ref 3.5–5.2)
Alkaline Phosphatase: 78 U/L (ref 39–117)
BUN: 12 mg/dL (ref 6–23)
CO2: 24 mEq/L (ref 19–32)
Calcium: 9.3 mg/dL (ref 8.4–10.5)
Chloride: 104 mEq/L (ref 96–112)
Creatinine, Ser: 0.72 mg/dL (ref 0.40–1.20)
GFR: 101.58 mL/min (ref >60.00)
Glucose, Bld: 98 mg/dL (ref 70–99)
Potassium: 4.1 mEq/L (ref 3.5–5.1)
Sodium: 138 mEq/L (ref 135–145)
Total Bilirubin: 0.6 mg/dL (ref 0.2–1.2)
Total Protein: 7.3 g/dL (ref 6.0–8.3)

## 2022-01-12 LAB — CBC WITH DIFFERENTIAL/PLATELET
Basophils Absolute: 0 10*3/uL (ref 0.0–0.1)
Basophils Relative: 0.7 % (ref 0.0–3.0)
Eosinophils Absolute: 0.4 10*3/uL (ref 0.0–0.7)
Eosinophils Relative: 8.1 % — ABNORMAL HIGH (ref 0.0–5.0)
HCT: 43.5 % (ref 36.0–46.0)
Hemoglobin: 14.7 g/dL (ref 12.0–15.0)
Lymphocytes Relative: 25.5 % (ref 12.0–46.0)
Lymphs Abs: 1.4 10*3/uL (ref 0.7–4.0)
MCHC: 33.9 g/dL (ref 30.0–36.0)
MCV: 86.5 fl (ref 78.0–100.0)
Monocytes Absolute: 0.5 10*3/uL (ref 0.1–1.0)
Monocytes Relative: 8.7 % (ref 3.0–12.0)
Neutro Abs: 3 10*3/uL (ref 1.4–7.7)
Neutrophils Relative %: 57 % (ref 43.0–77.0)
Platelets: 240 10*3/uL (ref 150.0–400.0)
RBC: 5.03 Mil/uL (ref 3.87–5.11)
RDW: 13.6 % (ref 11.5–15.5)
WBC: 5.3 10*3/uL (ref 4.0–10.5)

## 2022-01-12 LAB — LIPID PANEL
Cholesterol: 149 mg/dL (ref 0–200)
HDL: 36.5 mg/dL — ABNORMAL LOW (ref >39.00)
LDL Cholesterol: 95 mg/dL (ref 0–99)
NonHDL: 112.95
Total CHOL/HDL Ratio: 4
Triglycerides: 91 mg/dL (ref 0.0–149.0)
VLDL: 18.2 mg/dL (ref 0.0–40.0)

## 2022-01-12 LAB — HEMOGLOBIN A1C: Hgb A1c MFr Bld: 5.3 % (ref 4.6–6.5)

## 2022-01-12 LAB — TSH: TSH: 1.22 u[IU]/mL (ref 0.35–5.50)

## 2022-01-12 NOTE — Patient Instructions (Addendum)
[name] to see you  Call if you need anything  I'll let you know how labs look  Thank you,  Rich     If you have lab work done today you will be contacted with your lab results within the next 2 weeks.  If you have not heard from [place] then please contact us. The fastest way to get your results is to register for My Chart.   IF you received an x-ray today, you will receive an invoice from [facility]. Please contact [facility] at [phone] with questions or concerns regarding your invoice.   IF you received labwork today, you will receive an invoice from [facility]. Please contact [facility] at [phone] with questions or concerns regarding your invoice.   Our billing staff will not be able to assist you with questions regarding bills from these companies.  You will be contacted with the lab results as soon as they are available. The fastest way to get your results is to activate your My Chart account. Instructions are located on the last page of this paperwork. If you have not heard from [place] regarding the results in 2 weeks, please contact this office.

## 2022-01-12 NOTE — Progress Notes (Signed)
Established Patient Office Visit  Subjective:  Patient ID: Emma Mathews, female    DOB: [birth year]  Age: 46 y.o. MRN: [id number]  CC:  Chief Complaint  Patient presents with   Annual Exam    Patient states she is here for CPE    HPI Emma Mathews presents for CPE  No acute concerns  Histories reviewed and updated with patient.    Past Medical History:  Diagnosis Date   Appendicitis, acute 10/02/2013   Cancer ([place])    melanoma excision in 2013   Cholecystitis    Complication of anesthesia    slow to wake up   Headache    migraine   Mammographic breast lesion 06/28/2017   S/p biopsy, benign    Pneumonia 2010   PONV (postoperative nausea and vomiting)    Seasonal allergies    STRESS FRACTURE, FOOT 04/08/2009   Qualifier: Diagnosis of  By: Arnoldo Morale MD, Balinda Quails    Vision abnormalities     Past Surgical History:  Procedure Laterality Date   APPENDECTOMY     CHOLECYSTECTOMY     EYE SURGERY Bilateral    PRK   Labrum Repair Right 2000   Open   LAPAROSCOPIC APPENDECTOMY N/A 09/02/2013   Procedure: APPENDECTOMY LAPAROSCOPIC;  Surgeon: Harl Bowie, MD;  Location: [facility];  Service: General;  Laterality: N/A;   SHOULDER ARTHROSCOPY Left 2005   bone spurs    SHOULDER ARTHROSCOPY Right 11/01/2017   Procedure: RIGHT SHOULDER ARTHROSCOPY WITH SUBACROMIAL DECOMPRESSION;  Surgeon: Meredith Pel, MD;  Location: [facility];  Service: Orthopedics;  Laterality: Right;   SHOULDER ARTHROSCOPY WITH SUBACROMIAL DECOMPRESSION, ROTATOR CUFF REPAIR AND BICEP TENDON REPAIR Right 02/24/2016   Procedure: RIGHT SHOULDER ARTHROSCOPY WITH DEBRIDEMENT, BICEPS TENODESIS;  Surgeon: Meredith Pel, MD;  Location: [facility];  Service: Orthopedics;  Laterality: Right;   SHOULDER SURGERY Right 2007   Revision and repair flap   WISDOM TOOTH EXTRACTION     WRIST ARTHROSCOPY Right 11/09/2017   Procedure: ARTHROSCOPY WRIST WITH DEBRIDEMENT;  Surgeon: Charlotte Crumb, MD;  Location: [facility];  Service: Orthopedics;  Laterality: Right;    Family History  Adopted: Yes  Family history unknown: Yes    Social History   Socioeconomic History   Marital status: Married    Spouse name: Marjory Lies   Number of children: 1   Years of education: Not on file   Highest education level: Not on file  Occupational History   Not on file  Tobacco Use   Smoking status: Never   Smokeless tobacco: Never  Vaping Use   Vaping Use: Never used  Substance and Sexual Activity   Alcohol use: Yes    Comment: ocassional - social 1- 2 drinks   Drug use: No   Sexual activity: Yes    Birth control/protection: I.U.D.  Other Topics Concern   Not on file  Social History Narrative   Patient lives with husband and one child, a son named Emma Mathews.    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    Social Determinants of Health   Financial Resource Strain: Not on file  Food Insecurity: Not on file  Transportation Needs: Not on file  Physical Activity: Not on file  Stress: Not on file  Social Connections: Not on file  Intimate Partner Violence: Not on file    Outpatient Medications Prior to Visit  Medication Sig Dispense Refill   albuterol (VENTOLIN HFA) 108 (  90 Base) MCG/ACT inhaler Inhale 2 puffs into the lungs every 6 (six) hours as needed for wheezing or shortness of breath. 8 g 2   brompheniramine-pseudoephedrine-DM 30-2-10 MG/5ML syrup Take 5 mLs by mouth 4 (four) times daily as needed. 120 mL 0   cetirizine (ZYRTEC) 10 MG tablet Take 10 mg by mouth daily.     FLUoxetine (PROZAC) 20 MG capsule Take 1 capsule (20 mg total) by mouth daily. 90 capsule 3   hydrOXYzine (ATARAX/VISTARIL) 10 MG tablet Take 1/2 to 1 tablet (5-10 mg total) by mouth 3 (three) times daily as needed. 30 tablet 0   levonorgestrel (MIRENA) 20 MCG/24HR IUD Mirena 20 mcg/24 hours (5 yrs) 52 mg intrauterine device     Multiple Vitamins-Minerals (MULTIPLE VITAMINS/WOMENS PO)  Multiple Vitamin, Womens     rizatriptan (MAXALT-MLT) 10 MG disintegrating tablet DISSOLVE 1 TABLET (10 MG TOTAL) BY MOUTH AS NEEDED. MAY REPEAT IN 2 HOURS IF NEEDED 15 tablet 0   SUMAtriptan 6 MG/0.5ML SOAJ Inject 6mg  at onset of migraine.  May repeat in 2 hrs, if needed.  Emma Mathews dose: 2 injections/day. This is a 30 day prescription. 10 Cartridge 5   traZODone (DESYREL) 50 MG tablet Take 1/2 to 1 tablet (25-50 mg total) by mouth at bedtime as needed for sleep. 30 tablet 3   benzonatate (TESSALON) 100 MG capsule Take 1 capsule (100 mg total) by mouth 3 (three) times daily as needed for cough. (Patient not taking: Reported on 01/12/2022) 30 capsule 0   fexofenadine (ALLEGRA ALLERGY) 180 MG tablet Take 1 tablet (180 mg total) by mouth daily for 15 days. 15 tablet 0   methocarbamol (ROBAXIN) 750 MG tablet methocarbamol 750 mg tablet (Patient not taking: Reported on 01/12/2022)     mometasone (NASONEX) 50 MCG/ACT nasal spray Nasonex     predniSONE (DELTASONE) 20 MG tablet Take 3 tabs PO daily x 3 days, then 2 tabs PO daily x 3 days, then 1 tab PO daily x 3 days (Patient not taking: Reported on 01/12/2022) 18 tablet 0   promethazine (PHENERGAN) 12.5 MG tablet promethazine 12.5 mg tablet (Patient not taking: Reported on 01/12/2022)     promethazine-dextromethorphan (PROMETHAZINE-DM) 6.25-15 MG/5ML syrup Take 5 mLs by mouth 2 (two) times daily as needed for cough. (Patient not taking: Reported on 01/12/2022) 118 mL 0   scopolamine (TRANSDERM-SCOP) 1 MG/3DAYS PLACE 1 PATCH ONTO THE SKIN EVERY 3 DAYS. (Patient not taking: Reported on 01/12/2022) 10 patch 12   No facility-administered medications prior to visit.    Allergies  Allergen Reactions   Diphenhydramine Hcl Hives and Other (See Comments)    Pt states she can take Dye free Benadryl   Diphenhydramine Hcl Other (See Comments) and Hives    Pt states she can take Dye free Benadryl   Iodinated Contrast Media Shortness Of Breath, Nausea And Vomiting, Other (See  Comments) and Anaphylaxis    Shortness of breath   Chlorhexidine Gluconate Hives   Codeine Hives, Nausea And Vomiting and Rash    ROS Review of Systems  Constitutional: Negative.   HENT: Negative.    Eyes: Negative.   Respiratory: Negative.    Cardiovascular: Negative.   Gastrointestinal: Negative.   Genitourinary: Negative.   Musculoskeletal: Negative.   Skin: Negative.   Neurological: Negative.   Psychiatric/Behavioral: Negative.    All other systems reviewed and are negative.    Objective:    Physical Exam Vitals and nursing note reviewed.  Constitutional:      General: She is  not in acute distress.    Appearance: Normal appearance. She is obese. She is not ill-appearing, toxic-appearing or diaphoretic.  HENT:     Head: Normocephalic and atraumatic.     Right Ear: Tympanic membrane, ear canal and external ear normal. There is no impacted cerumen.     Left Ear: Tympanic membrane, ear canal and external ear normal. There is no impacted cerumen.     Nose: Nose normal. No congestion or rhinorrhea.     Mouth/Throat:     Mouth: Mucous membranes are moist.     Pharynx: Oropharynx is clear. No oropharyngeal exudate or posterior oropharyngeal erythema.  Eyes:     General: No scleral icterus.       Right eye: No discharge.        Left eye: No discharge.     Extraocular Movements: Extraocular movements intact.     Conjunctiva/sclera: Conjunctivae normal.     Pupils: Pupils are equal, round, and reactive to light.  Cardiovascular:     Rate and Rhythm: Normal rate and regular rhythm.     Pulses: Normal pulses.     Heart sounds: Normal heart sounds. No murmur heard.   No friction rub. No gallop.  Pulmonary:     Effort: Pulmonary effort is normal. No respiratory distress.     Breath sounds: Normal breath sounds. No stridor. No wheezing, rhonchi or rales.  Chest:     Chest wall: No tenderness.  Abdominal:     General: Abdomen is flat. Bowel sounds are normal. There is no  distension.     Palpations: Abdomen is soft. There is no mass.     Tenderness: There is no abdominal tenderness. There is no right CVA tenderness, left CVA tenderness, guarding or rebound.     Hernia: No hernia is present.  Musculoskeletal:        General: No swelling, tenderness, deformity or signs of injury. Normal range of motion.     Right lower leg: No edema.     Left lower leg: No edema.  Skin:    General: Skin is warm and dry.     Capillary Refill: Capillary refill takes less than 2 seconds.     Coloration: Skin is not jaundiced or pale.     Findings: No bruising, erythema, lesion or rash.  Neurological:     General: No focal deficit present.     Mental Status: She is alert and oriented to person, place, and time. Mental status is at baseline.     Cranial Nerves: No cranial nerve deficit.     Sensory: No sensory deficit.     Motor: No weakness.     Coordination: Coordination normal.     Gait: Gait normal.     Deep Tendon Reflexes: Reflexes normal.  Psychiatric:        Mood and Affect: Mood normal.        Behavior: Behavior normal.        Thought Content: Thought content normal.        Judgment: Judgment normal.    BP (!) 114/56    Pulse 62    Temp 97.8 F (36.6 C) (Temporal)    Resp 18    Ht 5\' 7"  (1.702 m)    Wt 220 lb (99.8 kg)    SpO2 100%    BMI 34.46 kg/m  Wt Readings from Last 3 Encounters:  01/12/22 220 lb (99.8 kg)  12/08/20 220 lb 3.2 oz (99.9 kg)  10/15/20 223 lb 3.2  oz (101.2 kg)     Health Maintenance Due  Topic Date Due   Hepatitis C Screening  Never done   PAP SMEAR-Modifier  06/18/2020   COVID-19 Vaccine (4 - Booster for Pfizer series) 12/30/2020    There are no preventive care reminders to display for this patient.  Lab Results  Component Value Date   TSH 1.770 12/08/2020   Lab Results  Component Value Date   WBC 6.1 12/08/2020   HGB 15.8 12/08/2020   HCT 44.9 12/08/2020   MCV 84 12/08/2020   PLT 216 02/01/2018   Lab Results   Component Value Date   NA 140 12/08/2020   K 4.5 12/08/2020   CO2 21 12/08/2020   GLUCOSE 102 (H) 12/08/2020   BUN 14 12/08/2020   CREATININE 0.84 12/08/2020   BILITOT 0.4 12/08/2020   ALKPHOS 92 12/08/2020   AST 18 12/08/2020   ALT 24 12/08/2020   PROT 7.5 12/08/2020   ALBUMIN 4.5 12/08/2020   CALCIUM 9.4 12/08/2020   ANIONGAP 8 11/01/2017   Lab Results  Component Value Date   CHOL 140 12/08/2020   Lab Results  Component Value Date   HDL 39 (L) 12/08/2020   Lab Results  Component Value Date   LDLCALC 85 12/08/2020   Lab Results  Component Value Date   TRIG 80 12/08/2020   Lab Results  Component Value Date   CHOLHDL 3.6 12/08/2020   Lab Results  Component Value Date   HGBA1C 5.2 12/08/2020      Assessment & Plan:   Problem List Items Addressed This Visit   None Visit Diagnoses     Screening for endocrine, metabolic and immunity disorder    -  Primary   Relevant Orders   CBC with Differential/Platelet   Comprehensive metabolic panel   Hemoglobin A1c   TSH   Lipid screening       Relevant Orders   Lipid panel   Encounter for screening for other viral diseases       Relevant Orders   Hepatitis C antibody       No orders of the defined types were placed in this encounter.   Follow-up: Return in about 1 year (around 01/12/2023) for CPE and labs.   PLAN Exam unremarkable Labs collected. Will follow up with the patient as warranted. Return annually Patient encouraged to call clinic with any questions, comments, or concerns.  Maximiano Coss, NP

## 2022-01-13 LAB — HEPATITIS C ANTIBODY
Hepatitis C Ab: NONREACTIVE
SIGNAL TO CUT-OFF: 0.02 (ref <1.00)

## 2022-01-22 DIAGNOSIS — R109 Unspecified abdominal pain: Secondary | ICD-10-CM | POA: Diagnosis not present

## 2022-01-22 DIAGNOSIS — R112 Nausea with vomiting, unspecified: Secondary | ICD-10-CM | POA: Diagnosis not present

## 2022-01-22 DIAGNOSIS — R319 Hematuria, unspecified: Secondary | ICD-10-CM | POA: Diagnosis not present

## 2022-02-12 ENCOUNTER — Other Ambulatory Visit (HOSPITAL_COMMUNITY): Payer: Self-pay

## 2022-03-04 ENCOUNTER — Other Ambulatory Visit (HOSPITAL_COMMUNITY): Payer: Self-pay

## 2022-03-04 DIAGNOSIS — S93402A Sprain of unspecified ligament of left ankle, initial encounter: Secondary | ICD-10-CM | POA: Diagnosis not present

## 2022-03-04 MED ORDER — MELOXICAM 15 MG PO TABS
ORAL_TABLET | ORAL | 0 refills | Status: DC
  Filled 2022-03-04: qty 30, 30d supply, fill #0

## 2022-03-10 DIAGNOSIS — M25572 Pain in left ankle and joints of left foot: Secondary | ICD-10-CM | POA: Insufficient documentation

## 2022-03-31 ENCOUNTER — Encounter: Payer: Self-pay | Admitting: Registered Nurse

## 2022-03-31 NOTE — Telephone Encounter (Signed)
Called and spoke with patient and made her an appointment. ?

## 2022-04-06 ENCOUNTER — Other Ambulatory Visit: Payer: Self-pay

## 2022-04-06 ENCOUNTER — Telehealth: Payer: 59 | Admitting: Registered Nurse

## 2022-04-06 ENCOUNTER — Encounter: Payer: Self-pay | Admitting: Registered Nurse

## 2022-04-06 VITALS — Wt 222.0 lb

## 2022-04-06 DIAGNOSIS — Z5321 Procedure and treatment not carried out due to patient leaving prior to being seen by health care provider: Secondary | ICD-10-CM

## 2022-04-06 DIAGNOSIS — E6609 Other obesity due to excess calories: Secondary | ICD-10-CM

## 2022-04-06 NOTE — Progress Notes (Incomplete Revision)
Telemedicine Encounter- SOAP NOTE Established Patient  This telephone encounter was conducted with the patient's (or proxy's) verbal consent via audio telecommunications: yes/no: Yes Patient was instructed to have this encounter in a suitably private space; and to only have persons present to whom they give permission to participate. In addition, patient identity was confirmed by use of name plus two identifiers (DOB and address).  I discussed the limitations, risks, security and privacy concerns of performing an evaluation and management service by telephone and the availability of in person appointments. I also discussed with the patient that there may be a patient responsible charge related to this service. The patient expressed understanding and agreed to proceed.  I spent a total of 17 minutes talking with the patient or their proxy.  Patient at home Provider in office  Participants: Kathrin Ruddy, NP and Jaynie Bream  Chief Complaint  Patient presents with   Weight Loss    Patient states needs a medication for weight loss. Patient states she has been working with personal trainers and all     Subjective   Emma Mathews is a 46 y.o. established patient. Telephone visit today for weight management  HPI Has been consistent with diet and exercise for some months now. She continues    NOTE: VISIT INTERRUPTED AND TO BE COMPLETED FRI 5/26  Patient Active Problem List   Diagnosis Date Noted   Acquired equinus deformity of left foot 01/08/2020   Disorder of Eustachian tube, right 12/21/2019   Low back pain 10/26/2019   Plantar fasciitis, left 06/05/2019   Chronic migraine with aura 03/27/2018   Obesity 02/01/2018   Headache 02/01/2018   Motion sickness 02/01/2018   History of migraine 02/01/2018   Degenerative TFCC tear, right 10/25/2017   ADD 10/28/2009   ANEMIA-UNSPECIFIED 04/28/2009   ALLERGIC RHINITIS 02/25/2009    Past Medical History:  Diagnosis Date    Appendicitis, acute 10/02/2013   Cancer ([place])    melanoma excision in 2013   Cholecystitis    Complication of anesthesia    slow to wake up   Headache    migraine   Mammographic breast lesion 06/28/2017   S/p biopsy, benign    Pneumonia 2010   PONV (postoperative nausea and vomiting)    Seasonal allergies    STRESS FRACTURE, FOOT 04/08/2009   Qualifier: Diagnosis of  By: Arnoldo Morale MD, Balinda Quails    Vision abnormalities     Current Outpatient Medications  Medication Sig Dispense Refill   albuterol (VENTOLIN HFA) 108 (90 Base) MCG/ACT inhaler Inhale 2 puffs into the lungs every 6 (six) hours as needed for wheezing or shortness of breath. 8 g 2   brompheniramine-pseudoephedrine-DM 30-2-10 MG/5ML syrup Take 5 mLs by mouth 4 (four) times daily as needed. 120 mL 0   cetirizine (ZYRTEC) 10 MG tablet Take 10 mg by mouth daily.     FLUoxetine (PROZAC) 20 MG capsule Take 1 capsule (20 mg total) by mouth daily. 90 capsule 3   hydrOXYzine (ATARAX/VISTARIL) 10 MG tablet Take 1/2 to 1 tablet (5-10 mg total) by mouth 3 (three) times daily as needed. 30 tablet 0   levonorgestrel (MIRENA) 20 MCG/24HR IUD Mirena 20 mcg/24 hours (5 yrs) 52 mg intrauterine device     mometasone (NASONEX) 50 MCG/ACT nasal spray Nasonex     Multiple Vitamins-Minerals (MULTIPLE VITAMINS/WOMENS PO) Multiple Vitamin, Womens     SUMAtriptan 6 MG/0.5ML SOAJ Inject '6mg'$  at onset of migraine.  May repeat in 2 hrs,  if needed.  Max dose: 2 injections/day. This is a 30 day prescription. 10 Cartridge 5   traZODone (DESYREL) 50 MG tablet Take 1/2 to 1 tablet (25-50 mg total) by mouth at bedtime as needed for sleep. 30 tablet 3   benzonatate (TESSALON) 100 MG capsule Take 1 capsule (100 mg total) by mouth 3 (three) times daily as needed for cough. (Patient not taking: Reported on 01/12/2022) 30 capsule 0   fexofenadine (ALLEGRA ALLERGY) 180 MG tablet Take 1 tablet (180 mg total) by mouth daily for 15 days. 15 tablet 0   meloxicam (MOBIC) 15 MG  tablet Take 1 tablet daily with food for 5 - 7 days, then as needed 30 tablet 0   methocarbamol (ROBAXIN) 750 MG tablet methocarbamol 750 mg tablet (Patient not taking: Reported on 01/12/2022)     predniSONE (DELTASONE) 20 MG tablet Take 3 tabs PO daily x 3 days, then 2 tabs PO daily x 3 days, then 1 tab PO daily x 3 days (Patient not taking: Reported on 01/12/2022) 18 tablet 0   promethazine (PHENERGAN) 12.5 MG tablet promethazine 12.5 mg tablet (Patient not taking: Reported on 01/12/2022)     promethazine-dextromethorphan (PROMETHAZINE-DM) 6.25-15 MG/5ML syrup Take 5 mLs by mouth 2 (two) times daily as needed for cough. (Patient not taking: Reported on 01/12/2022) 118 mL 0   rizatriptan (MAXALT-MLT) 10 MG disintegrating tablet DISSOLVE 1 TABLET (10 MG TOTAL) BY MOUTH AS NEEDED. MAY REPEAT IN 2 HOURS IF NEEDED 15 tablet 0   scopolamine (TRANSDERM-SCOP) 1 MG/3DAYS PLACE 1 PATCH ONTO THE SKIN EVERY 3 DAYS. (Patient not taking: Reported on 01/12/2022) 10 patch 12   No current facility-administered medications for this visit.    Allergies  Allergen Reactions   Diphenhydramine Hcl Hives and Other (See Comments)    Pt states she can take Dye free Benadryl   Diphenhydramine Hcl Other (See Comments) and Hives    Pt states she can take Dye free Benadryl   Iodinated Contrast Media Shortness Of Breath, Nausea And Vomiting, Other (See Comments) and Anaphylaxis    Shortness of breath   Chlorhexidine Gluconate Hives   Codeine Hives, Nausea And Vomiting and Rash    Social History   Socioeconomic History   Marital status: Married    Spouse name: Marjory Lies   Number of children: 1   Years of education: Not on file   Highest education level: Not on file  Occupational History   Not on file  Tobacco Use   Smoking status: Never   Smokeless tobacco: Never  Vaping Use   Vaping Use: Never used  Substance and Sexual Activity   Alcohol use: Yes    Comment: ocassional - social 1- 2 drinks   Drug use: No    Sexual activity: Yes    Birth control/protection: I.U.D.  Other Topics Concern   Not on file  Social History Narrative   Patient lives with husband and one child, a son named Max.    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    Social Determinants of Health   Financial Resource Strain: Not on file  Food Insecurity: Not on file  Transportation Needs: Not on file  Physical Activity: Not on file  Stress: Not on file  Social Connections: Not on file  Intimate Partner Violence: Not on file    ROS  Objective   Vitals as reported by the patient: Today's Vitals   04/06/22 1420  Weight: 222 lb (100.7 kg)  [place] was seen today for weight loss.  Diagnoses and all orders for this visit:  Class 1 obesity due to excess calories without serious comorbidity with body mass index (BMI) of 34.0 to 34.9 in adult     I discussed the assessment and treatment plan with the patient. The patient was provided an opportunity to ask questions and all were answered. The patient agreed with the plan and demonstrated an understanding of the instructions.   The patient was advised to call back or seek an in-person evaluation if the symptoms worsen or if the condition fails to improve as anticipated.  I provided *** minutes of non-face-to-face time during this encounter.  Maximiano Coss, NP  Primary Care at [facility]

## 2022-04-06 NOTE — Progress Notes (Addendum)
Pt left without being able to complete visit  Kathrin Ruddy, NP

## 2022-04-06 NOTE — Patient Instructions (Signed)
° ° ° °  If you have lab work done today you will be contacted with your lab results within the next 2 weeks.  If you have not heard from us then please contact us. The fastest way to get your results is to register for My Chart. ° ° °IF you received an x-ray today, you will receive an invoice from [facility]. Please contact [facility] at [phone] with questions or concerns regarding your invoice.  ° °IF you received labwork today, you will receive an invoice from [facility]. Please contact [facility] at [phone] with questions or concerns regarding your invoice.  ° °Our billing staff will not be able to assist you with questions regarding bills from these companies. ° °You will be contacted with the lab results as soon as they are available. The fastest way to get your results is to activate your My Chart account. Instructions are located on the last page of this paperwork. If you have not heard from us regarding the results in 2 weeks, please contact this office. °  ° ° ° °

## 2022-04-07 ENCOUNTER — Other Ambulatory Visit (HOSPITAL_COMMUNITY): Payer: Self-pay

## 2022-04-07 ENCOUNTER — Encounter: Payer: Self-pay | Admitting: Registered Nurse

## 2022-04-08 NOTE — Addendum Note (Signed)
Addended by: Maximiano Coss on: 04/08/2022 12:51 PM   Modules accepted: Level of Service

## 2022-04-09 NOTE — Addendum Note (Signed)
Addended by: Maximiano Coss on: 04/09/2022 01:42 PM   Modules accepted: Level of Service

## 2022-04-16 ENCOUNTER — Other Ambulatory Visit: Payer: Self-pay | Admitting: Registered Nurse

## 2022-04-16 ENCOUNTER — Other Ambulatory Visit (HOSPITAL_COMMUNITY): Payer: Self-pay

## 2022-04-16 DIAGNOSIS — S93402A Sprain of unspecified ligament of left ankle, initial encounter: Secondary | ICD-10-CM | POA: Diagnosis not present

## 2022-04-16 DIAGNOSIS — F418 Other specified anxiety disorders: Secondary | ICD-10-CM

## 2022-04-16 MED ORDER — HYDROXYZINE HCL 10 MG PO TABS
5.0000 mg | ORAL_TABLET | Freq: Three times a day (TID) | ORAL | 0 refills | Status: DC | PRN
  Filled 2022-04-16: qty 30, 10d supply, fill #0

## 2022-04-16 MED ORDER — FLUOXETINE HCL 40 MG PO CAPS
40.0000 mg | ORAL_CAPSULE | Freq: Every day | ORAL | 3 refills | Status: DC
  Filled 2022-04-16: qty 90, 90d supply, fill #0
  Filled 2022-07-11: qty 90, 90d supply, fill #1

## 2022-06-08 ENCOUNTER — Encounter: Payer: Self-pay | Admitting: Registered Nurse

## 2022-06-08 ENCOUNTER — Telehealth (INDEPENDENT_AMBULATORY_CARE_PROVIDER_SITE_OTHER): Payer: 59 | Admitting: Registered Nurse

## 2022-06-08 VITALS — Wt 226.0 lb

## 2022-06-08 DIAGNOSIS — E6609 Other obesity due to excess calories: Secondary | ICD-10-CM

## 2022-06-08 DIAGNOSIS — Z6834 Body mass index (BMI) 34.0-34.9, adult: Secondary | ICD-10-CM

## 2022-06-08 DIAGNOSIS — R5383 Other fatigue: Secondary | ICD-10-CM | POA: Insufficient documentation

## 2022-06-08 NOTE — Progress Notes (Signed)
Telemedicine Encounter- SOAP NOTE Established Patient  This telephone encounter was conducted with the patient's (or proxy's) verbal consent via audio telecommunications: yes/no: Yes Patient was instructed to have this encounter in a suitably private space; and to only have persons present to whom they give permission to participate. In addition, patient identity was confirmed by use of name plus two identifiers (DOB and address).  I discussed the limitations, risks, security and privacy concerns of performing an evaluation and management service by telephone and the availability of in person appointments. I also discussed with the patient that there may be a patient responsible charge related to this service. The patient expressed understanding and agreed to proceed.  I spent a total of 17 minutes talking with the patient or their proxy.  Patient at home Provider in office  Participants: Kathrin Ruddy, NP and Jaynie Bream  Chief Complaint  Patient presents with   Weight Loss    Weight loss recheck  Pt states she is not losing any weight  Pt states she is gaining weight     Subjective   Emma Mathews is a 46 y.o. established patient. Telephone visit today for weight management  HPI Has pursued healthy diet and regular exercise for over one year She has been calorie counting, working with nutritionists and Physiological scientist.   Unfortunately she has not lost any weight, and may have gained some.   She has had some fatigue, but notes she continues to do home reno on her parents house and has been working out a lot more. She admits that her sleep does not feel as restful as previously.  Has used phentermine before with limited effect and AE of sleep disturbance, palpitations.   Patient Active Problem List   Diagnosis Date Noted   Fatigue 06/08/2022   Pain of joint of left ankle and foot 03/10/2022   Acquired equinus deformity of left foot 01/08/2020   Disorder of  Eustachian tube, right 12/21/2019   Low back pain 10/26/2019   Plantar fasciitis, left 06/05/2019   Chronic migraine with aura 03/27/2018   Obesity 02/01/2018   Headache 02/01/2018   Motion sickness 02/01/2018   History of migraine 02/01/2018   Degenerative TFCC tear, right 10/25/2017   ADD 10/28/2009   ANEMIA-UNSPECIFIED 04/28/2009   ALLERGIC RHINITIS 02/25/2009    Past Medical History:  Diagnosis Date   Appendicitis, acute 10/02/2013   Cancer ([place])    melanoma excision in 2013   Cholecystitis    Complication of anesthesia    slow to wake up   Headache    migraine   Mammographic breast lesion 06/28/2017   S/p biopsy, benign    Pneumonia 2010   PONV (postoperative nausea and vomiting)    Seasonal allergies    STRESS FRACTURE, FOOT 04/08/2009   Qualifier: Diagnosis of  By: Arnoldo Morale MD, Balinda Quails    Vision abnormalities     Current Outpatient Medications  Medication Sig Dispense Refill   cetirizine (ZYRTEC) 10 MG tablet Take 10 mg by mouth daily.     FLUoxetine (PROZAC) 40 MG capsule Take 1 capsule by mouth daily. 90 capsule 3   hydrOXYzine (ATARAX) 10 MG tablet Take 1/2 to 1 tablet by mouth 3 times daily as needed. 30 tablet 0   levonorgestrel (MIRENA) 20 MCG/24HR IUD Mirena 20 mcg/24 hours (5 yrs) 52 mg intrauterine device     methocarbamol (ROBAXIN) 750 MG tablet      mometasone (NASONEX) 50 MCG/ACT nasal spray Nasonex  Multiple Vitamins-Minerals (MULTIPLE VITAMINS/WOMENS PO) Multiple Vitamin, Womens     rizatriptan (MAXALT-MLT) 10 MG disintegrating tablet DISSOLVE 1 TABLET (10 MG TOTAL) BY MOUTH AS NEEDED. MAY REPEAT IN 2 HOURS IF NEEDED 15 tablet 0   Semaglutide,0.25 or 0.'5MG'$ /DOS, 2 MG/1.5ML SOPN Inject 0.5 mg into the skin once a week. 4.5 mL 1   SUMAtriptan 6 MG/0.5ML SOAJ Inject '6mg'$  at onset of migraine.  May repeat in 2 hrs, if needed.  Max dose: 2 injections/day. This is a 30 day prescription. 10 Cartridge 5   traZODone (DESYREL) 50 MG tablet Take 1/2 to 1 tablet  (25-50 mg total) by mouth at bedtime as needed for sleep. 30 tablet 3   albuterol (VENTOLIN HFA) 108 (90 Base) MCG/ACT inhaler Inhale 2 puffs into the lungs every 6 (six) hours as needed for wheezing or shortness of breath. (Patient not taking: Reported on 06/08/2022) 8 g 2   benzonatate (TESSALON) 100 MG capsule Take 1 capsule (100 mg total) by mouth 3 (three) times daily as needed for cough. (Patient not taking: Reported on 01/12/2022) 30 capsule 0   brompheniramine-pseudoephedrine-DM 30-2-10 MG/5ML syrup Take 5 mLs by mouth 4 (four) times daily as needed. (Patient not taking: Reported on 06/08/2022) 120 mL 0   fexofenadine (ALLEGRA ALLERGY) 180 MG tablet Take 1 tablet (180 mg total) by mouth daily for 15 days. 15 tablet 0   meloxicam (MOBIC) 15 MG tablet Take 1 tablet daily with food for 5 - 7 days, then as needed (Patient not taking: Reported on 06/08/2022) 30 tablet 0   predniSONE (DELTASONE) 20 MG tablet Take 3 tabs PO daily x 3 days, then 2 tabs PO daily x 3 days, then 1 tab PO daily x 3 days (Patient not taking: Reported on 01/12/2022) 18 tablet 0   promethazine (PHENERGAN) 12.5 MG tablet promethazine 12.5 mg tablet (Patient not taking: Reported on 01/12/2022)     promethazine-dextromethorphan (PROMETHAZINE-DM) 6.25-15 MG/5ML syrup Take 5 mLs by mouth 2 (two) times daily as needed for cough. (Patient not taking: Reported on 01/12/2022) 118 mL 0   No current facility-administered medications for this visit.    Allergies  Allergen Reactions   Diphenhydramine Hcl Hives and Other (See Comments)    Pt states she can take Dye free Benadryl   Diphenhydramine Hcl Other (See Comments) and Hives    Pt states she can take Dye free Benadryl   Iodinated Contrast Media Shortness Of Breath, Nausea And Vomiting, Other (See Comments) and Anaphylaxis    Shortness of breath   Chlorhexidine Gluconate Hives   Codeine Hives, Nausea And Vomiting and Rash    Social History   Socioeconomic History   Marital  status: Married    Spouse name: Marjory Lies   Number of children: 1   Years of education: Not on file   Highest education level: Not on file  Occupational History   Not on file  Tobacco Use   Smoking status: Never   Smokeless tobacco: Never  Vaping Use   Vaping Use: Never used  Substance and Sexual Activity   Alcohol use: Yes    Comment: ocassional - social 1- 2 drinks   Drug use: No   Sexual activity: Yes    Birth control/protection: I.U.D.  Other Topics Concern   Not on file  Social History Narrative   Patient lives with husband and one child, a son named Max.    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  Social Determinants of Health   Financial Resource Strain: Not on file  Food Insecurity: Not on file  Transportation Needs: Not on file  Physical Activity: Not on file  Stress: Not on file  Social Connections: Not on file  Intimate Partner Violence: Not on file    ROS  Objective   Vitals as reported by the patient: Today's Vitals   06/08/22 1244  Weight: 226 lb (102.5 kg)    Kathryn was seen today for weight loss.  Diagnoses and all orders for this visit:  Class 1 obesity due to excess calories without serious comorbidity with body mass index (BMI) of 34.0 to 34.9 in adult -     Semaglutide,0.25 or 0.'5MG'$ /DOS, 2 MG/1.5ML SOPN; Inject 0.5 mg into the skin once a week.    PLAN Will start semaglutide 0.'5mg'$  subq weekly.  Med check within 3 mo Reviewed risks, benefits, and side effects, pt voices understanding.. Patient encouraged to call clinic with any questions, comments, or concerns.   I discussed the assessment and treatment plan with the patient. The patient was provided an opportunity to ask questions and all were answered. The patient agreed with the plan and demonstrated an understanding of the instructions.   The patient was advised to call back or seek an in-person evaluation if the symptoms worsen or if the  condition fails to improve as anticipated.  I provided 17 minutes of non-face-to-face time during this encounter.  Maximiano Coss, NP

## 2022-06-11 ENCOUNTER — Other Ambulatory Visit (HOSPITAL_COMMUNITY): Payer: Self-pay

## 2022-06-14 ENCOUNTER — Other Ambulatory Visit: Payer: Self-pay | Admitting: Registered Nurse

## 2022-06-14 ENCOUNTER — Other Ambulatory Visit (HOSPITAL_COMMUNITY): Payer: Self-pay

## 2022-06-14 DIAGNOSIS — E6609 Other obesity due to excess calories: Secondary | ICD-10-CM

## 2022-06-14 MED ORDER — SEMAGLUTIDE-WEIGHT MANAGEMENT 0.5 MG/0.5ML ~~LOC~~ SOAJ
0.5000 mg | SUBCUTANEOUS | 0 refills | Status: DC
  Filled 2022-06-14: qty 2, 28d supply, fill #0

## 2022-06-14 MED ORDER — SEMAGLUTIDE(0.25 OR 0.5MG/DOS) 2 MG/3ML ~~LOC~~ SOPN
0.5000 mg | PEN_INJECTOR | SUBCUTANEOUS | 1 refills | Status: DC
  Filled 2022-06-14: qty 3, 28d supply, fill #0

## 2022-06-15 ENCOUNTER — Other Ambulatory Visit (HOSPITAL_COMMUNITY): Payer: Self-pay

## 2022-06-17 ENCOUNTER — Telehealth: Payer: 59 | Admitting: Registered Nurse

## 2022-06-21 ENCOUNTER — Other Ambulatory Visit (HOSPITAL_COMMUNITY): Payer: Self-pay

## 2022-06-23 ENCOUNTER — Other Ambulatory Visit (HOSPITAL_COMMUNITY): Payer: Self-pay

## 2022-06-23 ENCOUNTER — Other Ambulatory Visit: Payer: Self-pay | Admitting: Family

## 2022-06-23 DIAGNOSIS — E6609 Other obesity due to excess calories: Secondary | ICD-10-CM

## 2022-06-23 MED ORDER — SEMAGLUTIDE-WEIGHT MANAGEMENT 0.5 MG/0.5ML ~~LOC~~ SOAJ: 0.5000 mg | SUBCUTANEOUS | 0 refills | Status: DC

## 2022-06-23 NOTE — Telephone Encounter (Signed)
Called patient and she stated she will try to find the medication.

## 2022-06-24 NOTE — Telephone Encounter (Signed)
Attempted to call patient to let her know medication was sent to her Pharmacy

## 2022-07-11 ENCOUNTER — Other Ambulatory Visit: Payer: Self-pay | Admitting: Registered Nurse

## 2022-07-11 DIAGNOSIS — F418 Other specified anxiety disorders: Secondary | ICD-10-CM

## 2022-07-12 ENCOUNTER — Other Ambulatory Visit (HOSPITAL_COMMUNITY): Payer: Self-pay

## 2022-07-12 MED ORDER — HYDROXYZINE HCL 10 MG PO TABS
5.0000 mg | ORAL_TABLET | Freq: Three times a day (TID) | ORAL | 0 refills | Status: DC | PRN
  Filled 2022-07-12: qty 30, 10d supply, fill #0

## 2022-09-14 ENCOUNTER — Other Ambulatory Visit (HOSPITAL_COMMUNITY): Payer: Self-pay

## 2022-09-14 ENCOUNTER — Other Ambulatory Visit: Payer: Self-pay | Admitting: Family Medicine

## 2022-09-14 DIAGNOSIS — F418 Other specified anxiety disorders: Secondary | ICD-10-CM

## 2022-09-14 MED ORDER — HYDROXYZINE HCL 10 MG PO TABS
5.0000 mg | ORAL_TABLET | Freq: Three times a day (TID) | ORAL | 0 refills | Status: DC | PRN
  Filled 2022-09-14: qty 30, 10d supply, fill #0

## 2022-09-27 DIAGNOSIS — M25561 Pain in right knee: Secondary | ICD-10-CM | POA: Diagnosis not present

## 2022-10-11 DIAGNOSIS — M9905 Segmental and somatic dysfunction of pelvic region: Secondary | ICD-10-CM | POA: Diagnosis not present

## 2022-10-11 DIAGNOSIS — M9906 Segmental and somatic dysfunction of lower extremity: Secondary | ICD-10-CM | POA: Diagnosis not present

## 2022-10-11 DIAGNOSIS — M722 Plantar fascial fibromatosis: Secondary | ICD-10-CM | POA: Diagnosis not present

## 2022-10-11 DIAGNOSIS — M9903 Segmental and somatic dysfunction of lumbar region: Secondary | ICD-10-CM | POA: Diagnosis not present

## 2022-10-12 DIAGNOSIS — M9905 Segmental and somatic dysfunction of pelvic region: Secondary | ICD-10-CM | POA: Diagnosis not present

## 2022-10-12 DIAGNOSIS — M722 Plantar fascial fibromatosis: Secondary | ICD-10-CM | POA: Diagnosis not present

## 2022-10-12 DIAGNOSIS — M9906 Segmental and somatic dysfunction of lower extremity: Secondary | ICD-10-CM | POA: Diagnosis not present

## 2022-10-12 DIAGNOSIS — M9903 Segmental and somatic dysfunction of lumbar region: Secondary | ICD-10-CM | POA: Diagnosis not present

## 2022-10-15 DIAGNOSIS — M9905 Segmental and somatic dysfunction of pelvic region: Secondary | ICD-10-CM | POA: Diagnosis not present

## 2022-10-15 DIAGNOSIS — M9906 Segmental and somatic dysfunction of lower extremity: Secondary | ICD-10-CM | POA: Diagnosis not present

## 2022-10-15 DIAGNOSIS — M9903 Segmental and somatic dysfunction of lumbar region: Secondary | ICD-10-CM | POA: Diagnosis not present

## 2022-10-15 DIAGNOSIS — M722 Plantar fascial fibromatosis: Secondary | ICD-10-CM | POA: Diagnosis not present

## 2022-10-27 DIAGNOSIS — M9905 Segmental and somatic dysfunction of pelvic region: Secondary | ICD-10-CM | POA: Diagnosis not present

## 2022-10-27 DIAGNOSIS — M9903 Segmental and somatic dysfunction of lumbar region: Secondary | ICD-10-CM | POA: Diagnosis not present

## 2022-10-27 DIAGNOSIS — M9906 Segmental and somatic dysfunction of lower extremity: Secondary | ICD-10-CM | POA: Diagnosis not present

## 2022-10-27 DIAGNOSIS — M722 Plantar fascial fibromatosis: Secondary | ICD-10-CM | POA: Diagnosis not present

## 2022-10-29 DIAGNOSIS — M9903 Segmental and somatic dysfunction of lumbar region: Secondary | ICD-10-CM | POA: Diagnosis not present

## 2022-10-29 DIAGNOSIS — M9905 Segmental and somatic dysfunction of pelvic region: Secondary | ICD-10-CM | POA: Diagnosis not present

## 2022-10-29 DIAGNOSIS — M722 Plantar fascial fibromatosis: Secondary | ICD-10-CM | POA: Diagnosis not present

## 2022-10-29 DIAGNOSIS — M9906 Segmental and somatic dysfunction of lower extremity: Secondary | ICD-10-CM | POA: Diagnosis not present

## 2022-11-02 DIAGNOSIS — M722 Plantar fascial fibromatosis: Secondary | ICD-10-CM | POA: Diagnosis not present

## 2022-11-02 DIAGNOSIS — M9903 Segmental and somatic dysfunction of lumbar region: Secondary | ICD-10-CM | POA: Diagnosis not present

## 2022-11-02 DIAGNOSIS — M9905 Segmental and somatic dysfunction of pelvic region: Secondary | ICD-10-CM | POA: Diagnosis not present

## 2022-11-02 DIAGNOSIS — M9906 Segmental and somatic dysfunction of lower extremity: Secondary | ICD-10-CM | POA: Diagnosis not present

## 2022-11-12 ENCOUNTER — Other Ambulatory Visit: Payer: Self-pay | Admitting: Registered Nurse

## 2022-11-12 ENCOUNTER — Other Ambulatory Visit (HOSPITAL_COMMUNITY): Payer: Self-pay

## 2022-11-12 DIAGNOSIS — E6609 Other obesity due to excess calories: Secondary | ICD-10-CM

## 2022-11-16 ENCOUNTER — Other Ambulatory Visit (HOSPITAL_COMMUNITY): Payer: Self-pay

## 2022-11-24 ENCOUNTER — Other Ambulatory Visit (HOSPITAL_COMMUNITY): Payer: Self-pay

## 2022-12-16 ENCOUNTER — Encounter: Payer: Self-pay | Admitting: Nurse Practitioner

## 2023-01-06 ENCOUNTER — Ambulatory Visit (INDEPENDENT_AMBULATORY_CARE_PROVIDER_SITE_OTHER): Payer: 59 | Admitting: Nurse Practitioner

## 2023-01-06 ENCOUNTER — Telehealth: Payer: 59

## 2023-01-06 ENCOUNTER — Encounter: Payer: Self-pay | Admitting: Nurse Practitioner

## 2023-01-06 ENCOUNTER — Other Ambulatory Visit (HOSPITAL_COMMUNITY): Payer: Self-pay

## 2023-01-06 VITALS — BP 124/70 | HR 72 | Ht 65.75 in | Wt 234.0 lb

## 2023-01-06 DIAGNOSIS — K219 Gastro-esophageal reflux disease without esophagitis: Secondary | ICD-10-CM | POA: Diagnosis not present

## 2023-01-06 DIAGNOSIS — Z1211 Encounter for screening for malignant neoplasm of colon: Secondary | ICD-10-CM | POA: Diagnosis not present

## 2023-01-06 MED ORDER — NA SULFATE-K SULFATE-MG SULF 17.5-3.13-1.6 GM/177ML PO SOLN
1.0000 | Freq: Once | ORAL | 0 refills | Status: AC
  Filled 2023-01-06: qty 354, 1d supply, fill #0

## 2023-01-06 MED ORDER — PANTOPRAZOLE SODIUM 40 MG PO TBEC
40.0000 mg | DELAYED_RELEASE_TABLET | Freq: Every day | ORAL | 1 refills | Status: DC
  Filled 2023-01-06: qty 90, 90d supply, fill #0
  Filled 2023-04-09: qty 90, 90d supply, fill #1
  Filled 2023-04-19: qty 90, 90d supply, fill #0

## 2023-01-06 NOTE — Patient Instructions (Addendum)
You have been scheduled for an endoscopy and colonoscopy. Please follow the written instructions given to you at your visit today. Please pick up your prep supplies at the pharmacy within the next 1-3 days. If you use inhalers (even only as needed), please bring them with you on the day of your procedure.   Take Pantoprazole 63m one capsule once daily to be taken 30 minutes before breakfast. A prescription will be sent to your pharmacy for this medication.   Take Famotidine 410mone tab at bed time    Gastroesophageal Reflux Disease, Adult Gastroesophageal reflux (GER) happens when acid from the stomach flows up into the tube that connects the mouth and the stomach (esophagus). Normally, food travels down the esophagus and stays in the stomach to be digested. However, when a person has GER, food and stomach acid sometimes move back up into the esophagus. If this becomes a more serious problem, the person may be diagnosed with a disease called gastroesophageal reflux disease (GERD). GERD occurs when the reflux: Happens often. Causes frequent or severe symptoms. Causes problems such as damage to the esophagus. When stomach acid comes in contact with the esophagus, the acid may cause inflammation in the esophagus. Over time, GERD may create small holes (ulcers) in the lining of the esophagus. What are the causes? This condition is caused by a problem with the muscle between the esophagus and the stomach (lower esophageal sphincter, or LES). Normally, the LES muscle closes after food passes through the esophagus to the stomach. When the LES is weakened or abnormal, it does not close properly, and that allows food and stomach acid to go back up into the esophagus. The LES can be weakened by certain dietary substances, medicines, and medical conditions, including: Tobacco use. Pregnancy. Having a hiatal hernia. Alcohol use. Certain foods and beverages, such as coffee, chocolate, onions, and  peppermint. What increases the risk? You are more likely to develop this condition if you: Have an increased body weight. Have a connective tissue disorder. Take NSAIDs, such as ibuprofen. What are the signs or symptoms? Symptoms of this condition include: Heartburn. Difficult or painful swallowing and the feeling of having a lump in the throat. A bitter taste in the mouth. Bad breath and having a large amount of saliva. Having an upset or bloated stomach and belching. Chest pain. Different conditions can cause chest pain. Make sure you see your health care provider if you experience chest pain. Shortness of breath or wheezing. Ongoing (chronic) cough or a nighttime cough. Wearing away of tooth enamel. Weight loss. How is this diagnosed? This condition may be diagnosed based on a medical history and a physical exam. To determine if you have mild or severe GERD, your health care provider may also monitor how you respond to treatment. You may also have tests, including: A test to examine your stomach and esophagus with a small camera (endoscopy). A test that measures the acidity level in your esophagus. A test that measures how much pressure is on your esophagus. A barium swallow or modified barium swallow test to show the shape, size, and functioning of your esophagus. How is this treated? Treatment for this condition may vary depending on how severe your symptoms are. Your health care provider may recommend: Changes to your diet. Medicine. Surgery. The goal of treatment is to help relieve your symptoms and to prevent complications. Follow these instructions at home: Eating and drinking  Follow a diet as recommended by your health care provider. This  may involve avoiding foods and drinks such as: Coffee and tea, with or without caffeine. Drinks that contain alcohol. Energy drinks and sports drinks. Carbonated drinks or sodas. Chocolate and cocoa. Peppermint and mint  flavorings. Garlic and onions. Horseradish. Spicy and acidic foods, including peppers, chili powder, curry powder, vinegar, hot sauces, and barbecue sauce. Citrus fruit juices and citrus fruits, such as oranges, lemons, and limes. Tomato-based foods, such as red sauce, chili, salsa, and pizza with red sauce. Fried and fatty foods, such as donuts, french fries, potato chips, and high-fat dressings. High-fat meats, such as hot dogs and fatty cuts of red and white meats, such as rib eye steak, sausage, ham, and bacon. High-fat dairy items, such as whole milk, butter, and cream cheese. Eat small, frequent meals instead of large meals. Avoid drinking large amounts of liquid with your meals. Avoid eating meals during the 2-3 hours before bedtime. Avoid lying down right after you eat. Do not exercise right after you eat. Lifestyle  Do not use any products that contain nicotine or tobacco. These products include cigarettes, chewing tobacco, and vaping devices, such as e-cigarettes. If you need help quitting, ask your health care provider. Try to reduce your stress by using methods such as yoga or meditation. If you need help reducing stress, ask your health care provider. If you are overweight, reduce your weight to an amount that is healthy for you. Ask your health care provider for guidance about a safe weight loss goal. General instructions Pay attention to any changes in your symptoms. Take over-the-counter and prescription medicines only as told by your health care provider. Do not take aspirin, ibuprofen, or other NSAIDs unless your health care provider told you to take these medicines. Wear loose-fitting clothing. Do not wear anything tight around your waist that causes pressure on your abdomen. Raise (elevate) the head of your bed about 6 inches (15 cm). You can use a wedge to do this. Avoid bending over if this makes your symptoms worse. Keep all follow-up visits. This is  important. Contact a health care provider if: You have: New symptoms. Unexplained weight loss. Difficulty swallowing or it hurts to swallow. Wheezing or a persistent cough. A hoarse voice. Your symptoms do not improve with treatment. Get help right away if: You have sudden pain in your arms, neck, jaw, teeth, or back. You suddenly feel sweaty, dizzy, or light-headed. You have chest pain or shortness of breath. You vomit and the vomit is green, yellow, or black, or it looks like blood or coffee grounds. You faint. You have stool that is red, bloody, or black. You cannot swallow, drink, or eat. These symptoms may represent a serious problem that is an emergency. Do not wait to see if the symptoms will go away. Get medical help right away. Call your local emergency services (911 in the [place] Do not drive yourself to the hospital. Summary Gastroesophageal reflux happens when acid from the stomach flows up into the esophagus. GERD is a disease in which the reflux happens often, causes frequent or severe symptoms, or causes problems such as damage to the esophagus. Treatment for this condition may vary depending on how severe your symptoms are. Your health care provider may recommend diet and lifestyle changes, medicine, or surgery. Contact a health care provider if you have new or worsening symptoms. Take over-the-counter and prescription medicines only as told by your health care provider. Do not take aspirin, ibuprofen, or other NSAIDs unless your health care provider told you  to do so. Keep all follow-up visits as told by your health care provider. This is important. This information is not intended to replace advice given to you by your health care provider. Make sure you discuss any questions you have with your health care provider. Document Revised: 05/12/2020 Document Reviewed: 05/12/2020 Elsevier Patient Education  [place]. Due to recent changes in healthcare laws, you may  see the results of your imaging and laboratory studies on MyChart before your provider has had a chance to review them.  We understand that in some cases there may be results that are confusing or concerning to you. Not all laboratory results come back in the same time frame and the provider may be waiting for multiple results in order to interpret others.  Please give [place] 48 hours in order for your provider to thoroughly review all the results before contacting the office for clarification of your results.    Thank you for trusting me with your gastrointestinal care!   Carl Best, CRNP

## 2023-01-06 NOTE — Progress Notes (Signed)
01/06/2023 JANAA TOPPING [id number] May 02, 1976   CHIEF COMPLAINT: Acid reflux  HISTORY OF PRESENT ILLNESS: Emma Mathews is a 47 year old female with a past medical history of anxiety, depression, anemia, malignant melanoma on back status post excision 2004, chronic headaches, pneumonia and gallstones s/p cholecystectomy.  Past appendectomy, right shoulder and right wrist surgery.  See further surgical history below.  She presents to our office today self-referred for further evaluation regarding acid reflux.  She endorses having acid reflux for the past 1-1/2 years which occurs most days. She is taking Pepcid 40 mg daily. She also takes Mylanta or Gaviscon as needed. Acidic food worsens her symptoms.  She also feels like solid food or liquid sometimes sits in her upper esophagus, she drinks water or waits a few minutes and the food or liquid passes. She sometimes gags after swallowing pills.  She burps a lot.  No upper or lower abdominal pain.  She is passing normal formed brown bowel movement daily.  No rectal bleeding or black stools.  Is adopted therefore family history is unknown.  She denies ever having screening colonoscopy.  She is attempting to lose weight.  She reported having prolonged sedation after she had right shoulder surgery 10/2017 and approximately 10 days later she had right wrist surgery after she fell which also resulted in prolonged sedation.  She received general anesthesia for her right shoulder surgery and she stated she remained intubated for little longer than anticipated but she was successfully extubated without any respiratory distress.  She stated receiving gas anesthesia for her wrist surgery 10 days later with mild prolonged sedation effects.   Past Medical History:  Diagnosis Date   Anemia    Anxiety    Appendicitis, acute 10/02/2013   Cancer ([place])    melanoma excision in 2013   Cholecystitis    Complication of anesthesia    slow to wake up    Depression    Headache    migraine   Mammographic breast lesion 06/28/2017   S/p biopsy, benign    Pneumonia 2010   PONV (postoperative nausea and vomiting)    Seasonal allergies    STRESS FRACTURE, FOOT 04/08/2009   Qualifier: Diagnosis of  By: Arnoldo Morale MD, Balinda Quails    Vision abnormalities    Past Surgical History:  Procedure Laterality Date   CHOLECYSTECTOMY     EYE SURGERY Bilateral    PRK   Labrum Repair Right 11/15/1998   Open   LAPAROSCOPIC APPENDECTOMY N/A 09/02/2013   Procedure: APPENDECTOMY LAPAROSCOPIC;  Surgeon: Harl Bowie, MD;  Location: [facility];  Service: General;  Laterality: N/A;   SHOULDER ARTHROSCOPY Left 11/16/2003   bone spurs    SHOULDER ARTHROSCOPY Right 11/01/2017   Procedure: RIGHT SHOULDER ARTHROSCOPY WITH SUBACROMIAL DECOMPRESSION;  Surgeon: Meredith Pel, MD;  Location: [facility];  Service: Orthopedics;  Laterality: Right;   SHOULDER ARTHROSCOPY WITH SUBACROMIAL DECOMPRESSION, ROTATOR CUFF REPAIR AND BICEP TENDON REPAIR Right 02/24/2016   Procedure: RIGHT SHOULDER ARTHROSCOPY WITH DEBRIDEMENT, BICEPS TENODESIS;  Surgeon: Meredith Pel, MD;  Location: [facility];  Service: Orthopedics;  Laterality: Right;   SHOULDER SURGERY Right 11/15/2005   Revision and repair flap   WISDOM TOOTH EXTRACTION     WRIST ARTHROSCOPY Right 11/09/2017   Procedure: ARTHROSCOPY WRIST WITH DEBRIDEMENT;  Surgeon: Charlotte Crumb, MD;  Location: [facility];  Service: Orthopedics;  Laterality: Right;   Social History: She is married.  She has 1 son.  She is  an Glass blower/designer.  Non-smoker.  Infrequent alcohol intake.  No drug use.  Family History: She was adopted. Family history is unknown by patient.  Allergies  Allergen Reactions   Diphenhydramine Hcl Hives and Other (See Comments)    Pt states she can take Dye free Benadryl   Diphenhydramine Hcl Other (See Comments) and Hives    Pt states she can take Dye free Benadryl   Iodinated Contrast Media  Shortness Of Breath, Nausea And Vomiting, Other (See Comments) and Anaphylaxis    Shortness of breath   Chlorhexidine Gluconate Hives   Codeine Hives, Nausea And Vomiting and Rash     Outpatient Encounter Medications as of 01/06/2023  Medication Sig   fexofenadine (ALLEGRA ALLERGY) 180 MG tablet Take 1 tablet (180 mg total) by mouth daily for 15 days.   hydrOXYzine (ATARAX) 10 MG tablet Take 0.5-1 tablets (5-10 mg total) by mouth 3 (three) times daily as needed. (Patient taking differently: Take 5-10 mg by mouth as needed.)   levonorgestrel (MIRENA) 20 MCG/24HR IUD Mirena 20 mcg/24 hours (5 yrs) 52 mg intrauterine device   mometasone (NASONEX) 50 MCG/ACT nasal spray Nasonex   Multiple Vitamins-Minerals (MULTIPLE VITAMINS/WOMENS PO) Multiple Vitamin, Womens   rizatriptan (MAXALT-MLT) 10 MG disintegrating tablet DISSOLVE 1 TABLET (10 MG TOTAL) BY MOUTH AS NEEDED. MAY REPEAT IN 2 HOURS IF NEEDED   SUMAtriptan 6 MG/0.5ML SOAJ Inject 51m at onset of migraine.  May repeat in 2 hrs, if needed.  Max dose: 2 injections/day. This is a 30 day prescription.   traZODone (DESYREL) 50 MG tablet Take 1/2 to 1 tablet (25-50 mg total) by mouth at bedtime as needed for sleep.   Semaglutide-Weight Management 0.5 MG/0.5ML SOAJ Inject 0.5 mg into the skin once a week. (Patient not taking: Reported on 01/06/2023)   [DISCONTINUED] cetirizine (ZYRTEC) 10 MG tablet Take 10 mg by mouth daily. (Patient not taking: Reported on 01/06/2023)   [DISCONTINUED] FLUoxetine (PROZAC) 40 MG capsule Take 1 capsule by mouth daily.   No facility-administered encounter medications on file as of 01/06/2023.    REVIEW OF SYSTEMS:  Gen: Denies fever, sweats or chills. No weight loss.  CV: Denies chest pain, palpitations or edema. Resp: Denies cough, shortness of breath of hemoptysis.  GI: See HPI. GU : Denies urinary burning, blood in urine, increased urinary frequency or incontinence. MS: Denies joint pain, muscles aches or  weakness. Derm: + Itchiness.  Psych: Denies depression, anxiety, memory loss or confusion. Heme: Denies bruising, easy bleeding. Neuro:  + Headaches. Endo:  Denies any problems with DM, thyroid or adrenal function.  PHYSICAL EXAM: BP 124/70 (BP Location: Left Arm, Patient Position: Sitting, Cuff Size: Large)   Pulse 72   Ht 5' 5.75" (1.67 m) Comment: height measured without shoes  Wt 234 lb (106.1 kg)   BMI 38.06 kg/m  General: 47year old female in no acute distress. Head: Normocephalic and atraumatic. Eyes:  Sclerae non-icteric, conjunctive pink. Ears: Normal auditory acuity. Mouth: Dentition intact. No ulcers or lesions.  Neck: Supple, no lymphadenopathy or thyromegaly.  Lungs: Clear bilaterally to auscultation without wheezes, crackles or rhonchi. Heart: Regular rate and rhythm. No murmur, rub or gallop appreciated.  Abdomen: Soft, nontender, nondistended. No masses. No hepatosplenomegaly. Normoactive bowel sounds x 4 quadrants.  Rectal: Deferred. Musculoskeletal: Symmetrical with no gross deformities. Skin: Warm and dry. No rash or lesions on visible extremities. Extremities: No edema. Neurological: Alert oriented x 4, no focal deficits.  Psychological:  Alert and cooperative. Normal mood and affect.  ASSESSMENT AND PLAN:  11) 47 year old female with GERD and dysphagia  -Pantoprazole 40 mg daily to be taken 30 minutes before breakfast -Famotidine 40 mg p.o. nightly -GERD handout -Weight loss encouraged -EGD benefits and risks discussed including risk with sedation, risk of bleeding, perforation and infection   2) Colon cancer screening -Colonoscopy benefits and risks discussed including risk with sedation, risk of bleeding, perforation and infection   Further recommendation to be determined after EGD and colonoscopy completed         CC:  Maximiano Coss, NP

## 2023-01-06 NOTE — Progress Notes (Signed)
Addendum: Reviewed and agree with assessment and management plan. Antoria Lanza M, MD  

## 2023-01-13 ENCOUNTER — Ambulatory Visit: Payer: 59 | Admitting: Nurse Practitioner

## 2023-01-13 ENCOUNTER — Encounter: Payer: Self-pay | Admitting: Nurse Practitioner

## 2023-01-13 ENCOUNTER — Other Ambulatory Visit (HOSPITAL_COMMUNITY): Payer: Self-pay

## 2023-01-13 VITALS — BP 120/72 | HR 87 | Temp 98.3°F | Ht 65.75 in | Wt 234.0 lb

## 2023-01-13 DIAGNOSIS — Z136 Encounter for screening for cardiovascular disorders: Secondary | ICD-10-CM | POA: Diagnosis not present

## 2023-01-13 DIAGNOSIS — Z13220 Encounter for screening for lipoid disorders: Secondary | ICD-10-CM

## 2023-01-13 DIAGNOSIS — Z131 Encounter for screening for diabetes mellitus: Secondary | ICD-10-CM | POA: Insufficient documentation

## 2023-01-13 DIAGNOSIS — E669 Obesity, unspecified: Secondary | ICD-10-CM | POA: Diagnosis not present

## 2023-01-13 HISTORY — DX: Encounter for screening for lipoid disorders: Z13.220

## 2023-01-13 LAB — COMPREHENSIVE METABOLIC PANEL
ALT: 18 U/L (ref 0–35)
AST: 19 U/L (ref 0–37)
Albumin: 4.3 g/dL (ref 3.5–5.2)
Alkaline Phosphatase: 73 U/L (ref 39–117)
BUN: 15 mg/dL (ref 6–23)
CO2: 28 mEq/L (ref 19–32)
Calcium: 10 mg/dL (ref 8.4–10.5)
Chloride: 102 mEq/L (ref 96–112)
Creatinine, Ser: 0.8 mg/dL (ref 0.40–1.20)
GFR: 88.89 mL/min (ref >60.00)
Glucose, Bld: 84 mg/dL (ref 70–99)
Potassium: 4 mEq/L (ref 3.5–5.1)
Sodium: 139 mEq/L (ref 135–145)
Total Bilirubin: 0.5 mg/dL (ref 0.2–1.2)
Total Protein: 8 g/dL (ref 6.0–8.3)

## 2023-01-13 LAB — TSH: TSH: 1.4 u[IU]/mL (ref 0.35–5.50)

## 2023-01-13 LAB — LIPID PANEL
Cholesterol: 147 mg/dL (ref 0–200)
HDL: 43.7 mg/dL (ref >39.00)
LDL Cholesterol: 86 mg/dL (ref 0–99)
NonHDL: 102.99
Total CHOL/HDL Ratio: 3
Triglycerides: 85 mg/dL (ref 0.0–149.0)
VLDL: 17 mg/dL (ref 0.0–40.0)

## 2023-01-13 LAB — CBC
HCT: 46.2 % — ABNORMAL HIGH (ref 36.0–46.0)
Hemoglobin: 15.7 g/dL — ABNORMAL HIGH (ref 12.0–15.0)
MCHC: 34 g/dL (ref 30.0–36.0)
MCV: 86.7 fl (ref 78.0–100.0)
Platelets: 258 10*3/uL (ref 150.0–400.0)
RBC: 5.32 Mil/uL — ABNORMAL HIGH (ref 3.87–5.11)
RDW: 13.8 % (ref 11.5–15.5)
WBC: 6 10*3/uL (ref 4.0–10.5)

## 2023-01-13 MED ORDER — TIRZEPATIDE 2.5 MG/0.5ML ~~LOC~~ SOAJ
2.5000 mg | SUBCUTANEOUS | 1 refills | Status: DC
  Filled 2023-01-13 – 2023-01-18 (×3): qty 2, 28d supply, fill #0

## 2023-01-13 NOTE — Assessment & Plan Note (Signed)
Labs ordered today, further recommendations may be made based upon their results.

## 2023-01-13 NOTE — Assessment & Plan Note (Addendum)
Labs ordered today, recommend lifestyle modification, after shared decision making discussion will also start tirzepatide weekly injection. Patient to follow-up in 1 month. Completed discussion regarding risks of medication and possible side effects and what to do if they occur. COnsidering her family history is unknown we discussed thyroid cancer black box warning and patient feels comfortable taking medication and will stop medication and notify me if she notices any lumps or masses develop on her neck.

## 2023-01-13 NOTE — Progress Notes (Signed)
New Patient Office Visit  Subjective    Patient ID: Emma Mathews, female    DOB: [birth year]  Age: 47 y.o. MRN: [id number]  CC:  Chief Complaint  Patient presents with   Obesity    HPI Emma Mathews presents to establish care Obesity: Patient is working with nutritionist as well as working on a regular basis.  Was prescribed semaglutide in the past but was never able to fill it due to being out of stock.  Recently found out her insurance would no longer cover this for weight loss.  Want to discuss other options.  Reports that she is adopted so does not know full family history however has done ancestry research and as far she knows has no family history of thyroid cancer gene mutations associated thyroid cancer.  Health maintenance: Preparing to undergo upper endoscopy for further evaluation of reflux as well as screening colonoscopy.  Plans on getting mammogram when it comes due in a couple months as well as Pap smear.   Outpatient Encounter Medications as of 01/13/2023  Medication Sig   hydrOXYzine (ATARAX) 10 MG tablet Take 0.5-1 tablets (5-10 mg total) by mouth 3 (three) times daily as needed. (Patient taking differently: Take 5-10 mg by mouth as needed.)   levonorgestrel (MIRENA) 20 MCG/24HR IUD Mirena 20 mcg/24 hours (5 yrs) 52 mg intrauterine device   mometasone (NASONEX) 50 MCG/ACT nasal spray Nasonex   Multiple Vitamins-Minerals (MULTIPLE VITAMINS/WOMENS PO) Multiple Vitamin, Womens   pantoprazole (PROTONIX) 40 MG tablet Take 1 tablet (40 mg total) by mouth daily. Take 30 minutes before breakfast.   rizatriptan (MAXALT-MLT) 10 MG disintegrating tablet DISSOLVE 1 TABLET (10 MG TOTAL) BY MOUTH AS NEEDED. MAY REPEAT IN 2 HOURS IF NEEDED   tirzepatide (MOUNJARO) 2.5 MG/0.5ML Pen Inject 2.5 mg into the skin once a week.   traZODone (DESYREL) 50 MG tablet Take 1/2 to 1 tablet (25-50 mg total) by mouth at bedtime as needed for sleep.   fexofenadine (ALLEGRA ALLERGY) 180 MG  tablet Take 1 tablet (180 mg total) by mouth daily for 15 days.   SUMAtriptan 6 MG/0.5ML SOAJ Inject '6mg'$  at onset of migraine.  May repeat in 2 hrs, if needed.  Max dose: 2 injections/day. This is a 30 day prescription. (Patient not taking: Reported on 01/13/2023)   [DISCONTINUED] Semaglutide-Weight Management 0.5 MG/0.5ML SOAJ Inject 0.5 mg into the skin once a week. (Patient not taking: Reported on 01/06/2023)   No facility-administered encounter medications on file as of 01/13/2023.    Past Medical History:  Diagnosis Date   Anemia    Anxiety    Appendicitis, acute 10/02/2013   Cancer ([place])    melanoma excision in 2013   Cholecystitis    Complication of anesthesia    slow to wake up   Depression    Encounter for lipid screening for cardiovascular disease 01/13/2023   Headache    migraine   Mammographic breast lesion 06/28/2017   S/p biopsy, benign    Pneumonia 2010   PONV (postoperative nausea and vomiting)    Seasonal allergies    STRESS FRACTURE, FOOT 04/08/2009   Qualifier: Diagnosis of  By: Arnoldo Morale MD, Balinda Quails    Vision abnormalities     Past Surgical History:  Procedure Laterality Date   CHOLECYSTECTOMY     EYE SURGERY Bilateral    PRK   Labrum Repair Right 11/15/1998   Open   LAPAROSCOPIC APPENDECTOMY N/A 09/02/2013   Procedure: APPENDECTOMY LAPAROSCOPIC;  Surgeon: Marco Collie  Ninfa Linden, MD;  Location: [facility];  Service: General;  Laterality: N/A;   SHOULDER ARTHROSCOPY Left 11/16/2003   bone spurs    SHOULDER ARTHROSCOPY Right 11/01/2017   Procedure: RIGHT SHOULDER ARTHROSCOPY WITH SUBACROMIAL DECOMPRESSION;  Surgeon: Meredith Pel, MD;  Location: [facility];  Service: Orthopedics;  Laterality: Right;   SHOULDER ARTHROSCOPY WITH SUBACROMIAL DECOMPRESSION, ROTATOR CUFF REPAIR AND BICEP TENDON REPAIR Right 02/24/2016   Procedure: RIGHT SHOULDER ARTHROSCOPY WITH DEBRIDEMENT, BICEPS TENODESIS;  Surgeon: Meredith Pel, MD;  Location: [facility];  Service: Orthopedics;   Laterality: Right;   SHOULDER SURGERY Right 11/15/2005   Revision and repair flap   WISDOM TOOTH EXTRACTION     WRIST ARTHROSCOPY Right 11/09/2017   Procedure: ARTHROSCOPY WRIST WITH DEBRIDEMENT;  Surgeon: Charlotte Crumb, MD;  Location: [facility];  Service: Orthopedics;  Laterality: Right;    Family History  Adopted: Yes  Family history unknown: Yes    Social History   Socioeconomic History   Marital status: Married    Spouse name: Marjory Lies   Number of children: 1   Years of education: Not on file   Highest education level: Not on file  Occupational History   Occupation: Glass blower/designer  Tobacco Use   Smoking status: Never   Smokeless tobacco: Never  Vaping Use   Vaping Use: Never used  Substance and Sexual Activity   Alcohol use: Yes    Comment: ocassional - social 1- 2 drinks   Drug use: No   Sexual activity: Yes    Birth control/protection: I.U.D.  Other Topics Concern   Not on file  Social History Narrative   Patient lives with husband and one child, a son named Max.    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    Social Determinants of Health   Financial Resource Strain: Not on file  Food Insecurity: Not on file  Transportation Needs: Not on file  Physical Activity: Not on file  Stress: Not on file  Social Connections: Not on file  Intimate Partner Violence: Not on file    Review of Systems  Constitutional:  Positive for malaise/fatigue.  Respiratory:  Negative for shortness of breath.   Cardiovascular:  Negative for chest pain.  Psychiatric/Behavioral:  Positive for depression. Negative for suicidal ideas. The patient does not have insomnia.         Objective    BP 120/72   Pulse 87   Temp 98.3 F (36.8 C) (Temporal)   Ht 5' 5.75" (1.67 m)   Wt 234 lb (106.1 kg)   SpO2 99%   BMI 38.06 kg/m      01/13/2023   11:28 AM 06/08/2022   12:44 PM 04/06/2022    2:21 PM  PHQ9 SCORE ONLY  PHQ-9 Total  Score 3 0 0      12/21/2019    4:09 PM  GAD 7 : Generalized Anxiety Score  Nervous, Anxious, on Edge 0  Control/stop worrying 0  Worry too much - different things 0  Trouble relaxing 0  Restless 0  Easily annoyed or irritable 0  Afraid - awful might happen 0  Total GAD 7 Score 0  Anxiety Difficulty Not difficult at all      Physical Exam Vitals reviewed.  Constitutional:      General: She is not in acute distress.    Appearance: Normal appearance.  HENT:     Head: Normocephalic and atraumatic.  Neck:     Vascular:  No carotid bruit.  Cardiovascular:     Rate and Rhythm: Normal rate and regular rhythm.     Pulses: Normal pulses.     Heart sounds: Normal heart sounds.  Pulmonary:     Effort: Pulmonary effort is normal.     Breath sounds: Normal breath sounds.  Skin:    General: Skin is warm and dry.  Neurological:     General: No focal deficit present.     Mental Status: She is alert and oriented to person, place, and time.  Psychiatric:        Mood and Affect: Mood normal.        Behavior: Behavior normal.        Judgment: Judgment normal.         Assessment & Plan:   Problem List Items Addressed This Visit       Other   Encounter for lipid screening for cardiovascular disease    Labs ordered today, further recommendations may be made based upon their results.       Relevant Orders   TSH   Hemoglobin A1c   Lipid panel   Comprehensive metabolic panel   CBC   Diabetes mellitus screening    Labs ordered today, further recommendations may be made based upon their results.       Relevant Orders   TSH   Hemoglobin A1c   Lipid panel   Comprehensive metabolic panel   CBC   Obesity (BMI 30-39.9) - Primary    Labs ordered today, recommend lifestyle modification, after shared decision making discussion will also start tirzepatide weekly injection. Patient to follow-up in 1 month. Completed discussion regarding risks of medication and possible side effects  and what to do if they occur. COnsidering her family history is unknown we discussed thyroid cancer black box warning and patient feels comfortable taking medication and will stop medication and notify me if she notices any lumps or masses develop on her neck.       Relevant Medications   tirzepatide [facility]) 2.5 MG/0.5ML Pen   Other Relevant Orders   TSH   Hemoglobin A1c   Lipid panel   Comprehensive metabolic panel   CBC    Return in about 1 month (around 02/11/2023) for CPE with Judson Roch.   Ailene Ards, NP

## 2023-01-13 NOTE — Patient Instructions (Signed)
1100-1200 calories/day Low glycemic index foods Weigh foods on food scale  60-100 ounces of water a day Exercise 150 mins/week

## 2023-01-14 ENCOUNTER — Encounter: Payer: 59 | Admitting: Registered Nurse

## 2023-01-14 ENCOUNTER — Other Ambulatory Visit (HOSPITAL_COMMUNITY): Payer: Self-pay

## 2023-01-14 LAB — HEMOGLOBIN A1C: Hgb A1c MFr Bld: 5.5 % (ref 4.6–6.5)

## 2023-01-17 ENCOUNTER — Encounter: Payer: 59 | Admitting: Registered Nurse

## 2023-01-18 ENCOUNTER — Other Ambulatory Visit: Payer: Self-pay

## 2023-01-21 ENCOUNTER — Ambulatory Visit: Payer: 59 | Admitting: Nurse Practitioner

## 2023-02-03 ENCOUNTER — Other Ambulatory Visit (HOSPITAL_COMMUNITY): Payer: Self-pay

## 2023-02-03 ENCOUNTER — Ambulatory Visit (INDEPENDENT_AMBULATORY_CARE_PROVIDER_SITE_OTHER): Payer: 59 | Admitting: Nurse Practitioner

## 2023-02-03 VITALS — BP 116/78 | HR 75 | Temp 97.8°F | Ht 65.75 in | Wt 229.4 lb

## 2023-02-03 DIAGNOSIS — E669 Obesity, unspecified: Secondary | ICD-10-CM

## 2023-02-03 DIAGNOSIS — D582 Other hemoglobinopathies: Secondary | ICD-10-CM | POA: Diagnosis not present

## 2023-02-03 LAB — CBC
HCT: 45.5 % (ref 36.0–46.0)
Hemoglobin: 15.5 g/dL — ABNORMAL HIGH (ref 12.0–15.0)
MCHC: 34 g/dL (ref 30.0–36.0)
MCV: 85.7 fl (ref 78.0–100.0)
Platelets: 241 10*3/uL (ref 150.0–400.0)
RBC: 5.32 Mil/uL — ABNORMAL HIGH (ref 3.87–5.11)
RDW: 13.3 % (ref 11.5–15.5)
WBC: 6.5 10*3/uL (ref 4.0–10.5)

## 2023-02-03 LAB — BASIC METABOLIC PANEL
BUN: 16 mg/dL (ref 6–23)
CO2: 30 mEq/L (ref 19–32)
Calcium: 10 mg/dL (ref 8.4–10.5)
Chloride: 100 mEq/L (ref 96–112)
Creatinine, Ser: 0.74 mg/dL (ref 0.40–1.20)
GFR: 97.57 mL/min (ref >60.00)
Glucose, Bld: 79 mg/dL (ref 70–99)
Potassium: 3.7 mEq/L (ref 3.5–5.1)
Sodium: 137 mEq/L (ref 135–145)

## 2023-02-03 MED ORDER — TIRZEPATIDE 5 MG/0.5ML ~~LOC~~ SOAJ
5.0000 mg | SUBCUTANEOUS | 1 refills | Status: DC
  Filled 2023-02-03 – 2023-02-10 (×3): qty 2, 28d supply, fill #0

## 2023-02-03 NOTE — Assessment & Plan Note (Signed)
Incidental finding, repeat CBC.  Further recommendations may be made based upon the results.

## 2023-02-03 NOTE — Assessment & Plan Note (Signed)
Chronic, tolerating Mounjaro well.  Per shared decision making will increase dose to 5 mg/week, patient to follow-up again in about 4 weeks to see how she is tolerating medication.  Patient again encouraged to focus on adequate hydration during the day.

## 2023-02-03 NOTE — Progress Notes (Signed)
Established Patient Office Visit  Subjective   Patient ID: Emma Mathews, female    DOB: 1976/02/10  Age: 47 y.o. MRN: [id number]  Chief Complaint  Patient presents with   Annual Exam    Want to go over lab and mounjaro     Obesity: Started on Mounjaro 2.5 mg/week, tolerating very well.  Does experience some abdominal pain and nausea day after shot is administered but otherwise feels well.  Has lost 5 pounds over the last month.  Denies constipation, has not experienced any new throat or thyroid masses.  Has mammogram and colonoscopy scheduled later this year.  Incidental finding of marginally elevated hemoglobin on last labs.  Patient denies being a smoker or known respiratory disease.    Review of Systems  Constitutional:  Positive for weight loss (intentional). Negative for fever and malaise/fatigue.  Respiratory:  Negative for shortness of breath.   Cardiovascular:  Negative for chest pain.  Gastrointestinal:  Negative for abdominal pain, blood in stool, constipation and nausea.  Neurological:  Positive for headaches (if not drinking enough). Negative for dizziness.  Psychiatric/Behavioral:  Negative for depression.       Objective:     BP 116/78   Pulse 75   Temp 97.8 F (36.6 C) (Oral)   Ht 5' 5.75" (1.67 m)   Wt 229 lb 6 oz (104 kg)   SpO2 98%   BMI 37.30 kg/m  BP Readings from Last 3 Encounters:  02/03/23 116/78  01/13/23 120/72  01/06/23 124/70   Wt Readings from Last 3 Encounters:  02/03/23 229 lb 6 oz (104 kg)  01/13/23 234 lb (106.1 kg)  01/06/23 234 lb (106.1 kg)      Physical Exam Vitals reviewed.  Constitutional:      General: She is not in acute distress.    Appearance: Normal appearance.  HENT:     Head: Normocephalic and atraumatic.  Neck:     Vascular: No carotid bruit.  Cardiovascular:     Rate and Rhythm: Normal rate and regular rhythm.     Pulses: Normal pulses.     Heart sounds: Normal heart sounds.  Pulmonary:      Effort: Pulmonary effort is normal.     Breath sounds: Normal breath sounds.  Skin:    General: Skin is warm and dry.  Neurological:     General: No focal deficit present.     Mental Status: She is alert and oriented to person, place, and time.  Psychiatric:        Mood and Affect: Mood normal.        Behavior: Behavior normal.        Judgment: Judgment normal.      Results for orders placed or performed in visit on [id number]  Basic metabolic panel  Result Value Ref Range   Sodium 137 135 - 145 mEq/L   Potassium 3.7 3.5 - 5.1 mEq/L   Chloride 100 96 - 112 mEq/L   CO2 30 19 - 32 mEq/L   Glucose, Bld 79 70 - 99 mg/dL   BUN 16 6 - 23 mg/dL   Creatinine, Ser 0.74 0.40 - 1.20 mg/dL   GFR 97.57 >60.00 mL/min   Calcium 10.0 8.4 - 10.5 mg/dL  CBC  Result Value Ref Range   WBC 6.5 4.0 - 10.5 K/uL   RBC 5.32 (H) 3.87 - 5.11 Mil/uL   Platelets 241.0 150.0 - 400.0 K/uL   Hemoglobin 15.5 (H) 12.0 - 15.0 g/dL   HCT  45.5 36.0 - 46.0 %   MCV 85.7 78.0 - 100.0 fl   MCHC 34.0 30.0 - 36.0 g/dL   RDW 13.3 11.5 - 15.5 %      The 10-year ASCVD risk score (Arnett DK, et al., 2019) is: 0.6%    Assessment & Plan:   Problem List Items Addressed This Visit       Other   Obesity (BMI 30-39.9)    Chronic, tolerating Mounjaro well.  Per shared decision making will increase dose to 5 mg/week, patient to follow-up again in about 4 weeks to see how she is tolerating medication.  Patient again encouraged to focus on adequate hydration during the day.      Relevant Medications   tirzepatide (MOUNJARO) 5 MG/0.5ML Pen   Other Relevant Orders   Basic metabolic panel (Completed)   Elevated hemoglobin ([facility]) - Primary    Incidental finding, repeat CBC.  Further recommendations may be made based upon the results.      Relevant Orders   CBC (Completed)    Return in about 1 month (around 03/06/2023) for F/U with Judson Roch, may be virtual .    Ailene Ards, NP

## 2023-02-04 ENCOUNTER — Other Ambulatory Visit (HOSPITAL_COMMUNITY): Payer: Self-pay

## 2023-02-10 ENCOUNTER — Other Ambulatory Visit (HOSPITAL_COMMUNITY): Payer: Self-pay

## 2023-02-14 ENCOUNTER — Encounter: Payer: Self-pay | Admitting: Nurse Practitioner

## 2023-02-15 NOTE — Telephone Encounter (Signed)
Forwarding to [employer].Marland KitchenJohny Chess

## 2023-02-28 DIAGNOSIS — D2261 Melanocytic nevi of right upper limb, including shoulder: Secondary | ICD-10-CM | POA: Diagnosis not present

## 2023-02-28 DIAGNOSIS — D489 Neoplasm of uncertain behavior, unspecified: Secondary | ICD-10-CM | POA: Diagnosis not present

## 2023-03-07 ENCOUNTER — Encounter: Payer: Self-pay | Admitting: Internal Medicine

## 2023-03-07 ENCOUNTER — Ambulatory Visit (AMBULATORY_SURGERY_CENTER): Payer: 59 | Admitting: Internal Medicine

## 2023-03-07 VITALS — BP 108/72 | HR 74 | Temp 98.0°F | Resp 16 | Ht 65.0 in | Wt 234.0 lb

## 2023-03-07 DIAGNOSIS — D125 Benign neoplasm of sigmoid colon: Secondary | ICD-10-CM | POA: Diagnosis not present

## 2023-03-07 DIAGNOSIS — Z1211 Encounter for screening for malignant neoplasm of colon: Secondary | ICD-10-CM

## 2023-03-07 DIAGNOSIS — F32A Depression, unspecified: Secondary | ICD-10-CM | POA: Diagnosis not present

## 2023-03-07 DIAGNOSIS — K2100 Gastro-esophageal reflux disease with esophagitis, without bleeding: Secondary | ICD-10-CM | POA: Diagnosis not present

## 2023-03-07 DIAGNOSIS — K219 Gastro-esophageal reflux disease without esophagitis: Secondary | ICD-10-CM

## 2023-03-07 DIAGNOSIS — R1319 Other dysphagia: Secondary | ICD-10-CM | POA: Diagnosis not present

## 2023-03-07 DIAGNOSIS — R1310 Dysphagia, unspecified: Secondary | ICD-10-CM | POA: Diagnosis not present

## 2023-03-07 DIAGNOSIS — F419 Anxiety disorder, unspecified: Secondary | ICD-10-CM | POA: Diagnosis not present

## 2023-03-07 MED ORDER — SODIUM CHLORIDE 0.9 % IV SOLN
500.0000 mL | Freq: Once | INTRAVENOUS | Status: DC
Start: 2023-03-07 — End: 2023-03-07

## 2023-03-07 NOTE — Op Note (Signed)
[facility] Patient Name: Emma Mathews Procedure Date: 03/07/2023 1:30 PM MRN: [id number] Endoscopist: Beverley Fiedler , MD, [id number] Age: 47 Referring MD:  Date of Birth: 1976-10-18 Gender: Female Account #: [id number] Procedure:                Colonoscopy Indications:              Screening for colorectal malignant neoplasm, This                            is the patient's first colonoscopy Medicines:                Monitored Anesthesia Care Procedure:                Pre-Anesthesia Assessment:                           - Prior to the procedure, a History and Physical                            was performed, and patient medications and                            allergies were reviewed. The patient's tolerance of                            previous anesthesia was also reviewed. The risks                            and benefits of the procedure and the sedation                            options and risks were discussed with the patient.                            All questions were answered, and informed consent                            was obtained. Prior Anticoagulants: The patient has                            taken no anticoagulant or antiplatelet agents. ASA                            Grade Assessment: II - A patient with mild systemic                            disease. After reviewing the risks and benefits,                            the patient was deemed in satisfactory condition to                            undergo the procedure.  After obtaining informed consent, the colonoscope                            was passed under direct vision. Throughout the                            procedure, the patient's blood pressure, pulse, and                            oxygen saturations were monitored continuously. The                            Olympus CF-HQ190L ([id number]) Colonoscope was                            introduced through the anus  and advanced to the                            terminal ileum. The colonoscopy was performed                            without difficulty. The patient tolerated the                            procedure well. The quality of the bowel                            preparation was good. The terminal ileum, ileocecal                            valve, appendiceal orifice, and rectum were                            photographed. Scope In: 1:52:19 PM Scope Out: 2:05:27 PM Scope Withdrawal Time: 0 hours 9 minutes 58 seconds  Total Procedure Duration: 0 hours 13 minutes 8 seconds  Findings:                 The digital rectal exam was normal.                           The terminal ileum appeared normal.                           A 5 mm polyp was found in the sigmoid colon. The                            polyp was sessile. The polyp was removed with a                            cold snare. Resection and retrieval were complete.                           The exam was otherwise without abnormality on  direct and retroflexion views. Complications:            No immediate complications. Estimated Blood Loss:     Estimated blood loss was minimal. Impression:               - The examined portion of the ileum was normal.                           - One 5 mm polyp in the sigmoid colon, removed with                            a cold snare. Resected and retrieved.                           - The examination was otherwise normal on direct                            and retroflexion views. Recommendation:           - Patient has a contact number available for                            emergencies. The signs and symptoms of potential                            delayed complications were discussed with the                            patient. Return to normal activities tomorrow.                            Written discharge instructions were provided to the                             patient.                           - Resume previous diet.                           - Continue present medications.                           - Await pathology results.                           - Repeat colonoscopy is recommended. The                            colonoscopy date will be determined after pathology                            results from today's exam become available for                            review. Beverley Fiedler, MD 03/07/2023 2:13:08 PM This report has been  signed electronically.

## 2023-03-07 NOTE — Op Note (Signed)
[facility] Patient Name: Emma Mathews Procedure Date: 03/07/2023 1:30 PM MRN: [id number] Endoscopist: Beverley Fiedler , MD, [id number] Age: 47 Referring MD:  Date of Birth: [birth year] Gender: Female Account #: [id number] Procedure:                Upper GI endoscopy Indications:              Dysphagia, Suspected gastro-esophageal reflux                            disease -- symptoms much improved since starting                            pantoprazole 40 mg daily Medicines:                Monitored Anesthesia Care Procedure:                Pre-Anesthesia Assessment:                           - Prior to the procedure, a History and Physical                            was performed, and patient medications and                            allergies were reviewed. The patient's tolerance of                            previous anesthesia was also reviewed. The risks                            and benefits of the procedure and the sedation                            options and risks were discussed with the patient.                            All questions were answered, and informed consent                            was obtained. Prior Anticoagulants: The patient has                            taken no anticoagulant or antiplatelet agents. ASA                            Grade Assessment: II - A patient with mild systemic                            disease. After reviewing the risks and benefits,                            the patient was deemed in satisfactory condition to  undergo the procedure.                           After obtaining informed consent, the endoscope was                            passed under direct vision. Throughout the                            procedure, the patient's blood pressure, pulse, and                            oxygen saturations were monitored continuously. The                            Olympus Scope [id number] was  introduced through the                            mouth, and advanced to the second part of duodenum.                            The upper GI endoscopy was accomplished without                            difficulty. The patient tolerated the procedure                            well. Scope In: Scope Out: Findings:                 LA Grade A (one or more mucosal breaks less than 5                            mm, not extending between tops of 2 mucosal folds)                            esophagitis was found at the gastroesophageal                            junction. Biopsies were taken with a cold forceps                            for histology.                           The cardia and gastric fundus were normal on                            retroflexion.                           The entire examined stomach was normal.                           The examined duodenum was normal. Complications:  No immediate complications. Estimated Blood Loss:     Estimated blood loss was minimal. Impression:               - LA Grade A (mild) reflux esophagitis. Biopsied to                            exclude elevated eosinophils.                           - Normal stomach.                           - Normal examined duodenum. Recommendation:           - Patient has a contact number available for                            emergencies. The signs and symptoms of potential                            delayed complications were discussed with the                            patient. Return to normal activities tomorrow.                            Written discharge instructions were provided to the                            patient.                           - Resume previous diet.                           - Continue present medications.                           - Await pathology results. Beverley Fiedler, MD 03/07/2023 2:11:40 PM This report has been signed electronically.

## 2023-03-07 NOTE — Patient Instructions (Signed)
-  Handout on polyps provided -await pathology results -repeat colonoscopy for surveillance recommended. Date to be determined when pathology result become available   -Continue present medications   YOU HAD AN ENDOSCOPIC PROCEDURE TODAY AT THE [facility]:   Refer to the procedure report that was given to you for any specific questions about what was found during the examination.  If the procedure report does not answer your questions, please call your gastroenterologist to clarify.  If you requested that your care partner not be given the details of your procedure findings, then the procedure report has been included in a sealed envelope for you to review at your convenience later.  YOU SHOULD EXPECT: Some feelings of bloating in the abdomen. Passage of more gas than usual.  Walking can help get rid of the air that was put into your GI tract during the procedure and reduce the bloating. If you had a lower endoscopy (such as a colonoscopy or flexible sigmoidoscopy) you may notice spotting of blood in your stool or on the toilet paper. If you underwent a bowel prep for your procedure, you may not have a normal bowel movement for a few days.  Please Note:  You might notice some irritation and congestion in your nose or some drainage.  This is from the oxygen used during your procedure.  There is no need for concern and it should clear up in a day or so.  SYMPTOMS TO REPORT IMMEDIATELY:  Following lower endoscopy (colonoscopy or flexible sigmoidoscopy):  Excessive amounts of blood in the stool  Significant tenderness or worsening of abdominal pains  Swelling of the abdomen that is new, acute  Fever of 100F or higher  Following upper endoscopy (EGD)  Vomiting of blood or coffee ground material  New chest pain or pain under the shoulder blades  Painful or persistently difficult swallowing  New shortness of breath  Fever of 100F or higher  Black, tarry-looking stools  For urgent or  emergent issues, a gastroenterologist can be reached at any hour by calling [phone]. Do not use MyChart messaging for urgent concerns.    DIET:  We do recommend a small meal at first, but then you may proceed to your regular diet.  Drink plenty of fluids but you should avoid alcoholic beverages for 24 hours.  ACTIVITY:  You should plan to take it easy for the rest of today and you should NOT DRIVE or use heavy machinery until tomorrow (because of the sedation medicines used during the test).    FOLLOW UP: Our staff will call the number listed on your records the next business day following your procedure.  We will call around 7:15- 8:00 am to check on you and address any questions or concerns that you may have regarding the information given to you following your procedure. If we do not reach you, we will leave a message.     If any biopsies were taken you will be contacted by phone or by letter within the next 1-3 weeks.  Please call us at [phone] if you have not heard about the biopsies in 3 weeks.    SIGNATURES/CONFIDENTIALITY: You and/or your care partner have signed paperwork which will be entered into your electronic medical record.  These signatures attest to the fact that that the information above on your After Visit Summary has been reviewed and is understood.  Full responsibility of the confidentiality of this discharge information lies with you and/or your care-partner.   

## 2023-03-07 NOTE — Progress Notes (Signed)
GASTROENTEROLOGY PROCEDURE H&P NOTE   Primary Care Physician: Elenore Paddy, NP    Reason for Procedure:   Genella Rife, dysphagia, and crc screening  Plan:    EGD/colonoscopy  Patient is appropriate for endoscopic procedure(s) in the ambulatory ([facility]) setting.  The nature of the procedure, as well as the risks, benefits, and alternatives were carefully and thoroughly reviewed with the patient. Ample time for discussion and questions allowed. The patient understood, was satisfied, and agreed to proceed.     HPI: Emma Mathews is a 47 y.o. female who presents for upper and lower endoscopy.  Medical history as below.  Tolerated the prep.  No recent chest pain or shortness of breath.  No abdominal pain today.  Past Medical History:  Diagnosis Date   Anemia    Anxiety    Appendicitis, acute 10/02/2013   Cancer    melanoma excision in 2013   Cholecystitis    Complication of anesthesia    slow to wake up   Depression    Encounter for lipid screening for cardiovascular disease 01/13/2023   Headache    migraine   Mammographic breast lesion 06/28/2017   S/p biopsy, benign    Pneumonia 2010   PONV (postoperative nausea and vomiting)    Seasonal allergies    STRESS FRACTURE, FOOT 04/08/2009   Qualifier: Diagnosis of  By: Lovell Sheehan MD, Balinda Quails    Vision abnormalities     Past Surgical History:  Procedure Laterality Date   CHOLECYSTECTOMY     EYE SURGERY Bilateral    PRK   Labrum Repair Right 11/15/1998   Open   LAPAROSCOPIC APPENDECTOMY N/A 09/02/2013   Procedure: APPENDECTOMY LAPAROSCOPIC;  Surgeon: Shelly Rubenstein, MD;  Location: [facility] OR;  Service: General;  Laterality: N/A;   SHOULDER ARTHROSCOPY Left 11/16/2003   bone spurs    SHOULDER ARTHROSCOPY Right 11/01/2017   Procedure: RIGHT SHOULDER ARTHROSCOPY WITH SUBACROMIAL DECOMPRESSION;  Surgeon: Cammy Copa, MD;  Location: [facility] OR;  Service: Orthopedics;  Laterality: Right;   SHOULDER ARTHROSCOPY WITH SUBACROMIAL  DECOMPRESSION, ROTATOR CUFF REPAIR AND BICEP TENDON REPAIR Right 02/24/2016   Procedure: RIGHT SHOULDER ARTHROSCOPY WITH DEBRIDEMENT, BICEPS TENODESIS;  Surgeon: Cammy Copa, MD;  Location: [facility] OR;  Service: Orthopedics;  Laterality: Right;   SHOULDER SURGERY Right 11/15/2005   Revision and repair flap   WISDOM TOOTH EXTRACTION     WRIST ARTHROSCOPY Right 11/09/2017   Procedure: ARTHROSCOPY WRIST WITH DEBRIDEMENT;  Surgeon: Dairl Ponder, MD;  Location: [facility];  Service: Orthopedics;  Laterality: Right;    Prior to Admission medications   Medication Sig Start Date End Date Taking? Authorizing Provider  levonorgestrel (MIRENA) 20 MCG/24HR IUD Mirena 20 mcg/24 hours (5 yrs) 52 mg intrauterine device   Yes [provider]  mometasone (NASONEX) 50 MCG/ACT nasal spray Nasonex   Yes [provider]  Multiple Vitamins-Minerals (MULTIPLE VITAMINS/WOMENS PO) Multiple Vitamin, Womens   Yes [provider]  pantoprazole (PROTONIX) 40 MG tablet Take 1 tablet (40 mg total) by mouth daily. Take 30 minutes before breakfast. 01/06/23  Yes Kennedy-Smith, Malachi Carl, NP  hydrOXYzine (ATARAX) 10 MG tablet Take 0.5-1 tablets (5-10 mg total) by mouth 3 (three) times daily as needed. Patient taking differently: Take 5-10 mg by mouth as needed. 09/14/22   Sheliah Hatch, MD  rizatriptan (MAXALT-MLT) 10 MG disintegrating tablet DISSOLVE 1 TABLET (10 MG TOTAL) BY MOUTH AS NEEDED. MAY REPEAT IN 2 HOURS IF NEEDED 09/12/19   Terrace Arabia,  Yijun, MD  SUMAtriptan 6 MG/0.5ML SOAJ Inject  at onset of migraine.  May repeat in 2 hrs, if needed.  Max dose: 2 injections/day. This is a 30 day prescription. 03/31/18   Levert Feinstein, MD  tirzepatide [facility]) 5 MG/0.5ML Pen Inject 5 mg into the skin once a week. 02/03/23   Elenore Paddy, NP  traZODone (DESYREL) 50 MG tablet Take 1/2 to 1 tablet (25-50 mg total) by mouth at bedtime as needed for sleep. 07/24/21   Janeece Agee, NP     Current Outpatient Medications  Medication Sig Dispense Refill   levonorgestrel (MIRENA) 20 MCG/24HR IUD Mirena 20 mcg/24 hours (5 yrs) 52 mg intrauterine device     mometasone (NASONEX) 50 MCG/ACT nasal spray Nasonex     Multiple Vitamins-Minerals (MULTIPLE VITAMINS/WOMENS PO) Multiple Vitamin, Womens     pantoprazole (PROTONIX) 40 MG tablet Take 1 tablet (40 mg total) by mouth daily. Take 30 minutes before breakfast. 90 tablet 1   hydrOXYzine (ATARAX) 10 MG tablet Take 0.5-1 tablets (5-10 mg total) by mouth 3 (three) times daily as needed. (Patient taking differently: Take 5-10 mg by mouth as needed.) 30 tablet 0   rizatriptan (MAXALT-MLT) 10 MG disintegrating tablet DISSOLVE 1 TABLET (10 MG TOTAL) BY MOUTH AS NEEDED. MAY REPEAT IN 2 HOURS IF NEEDED 15 tablet 0   SUMAtriptan 6 MG/0.5ML SOAJ Inject  at onset of migraine.  May repeat in 2 hrs, if needed.  Max dose: 2 injections/day. This is a 30 day prescription. 10 Cartridge 5   tirzepatide (MOUNJARO) 5 MG/0.5ML Pen Inject 5 mg into the skin once a week. 2 mL 1   traZODone (DESYREL) 50 MG tablet Take 1/2 to 1 tablet (25-50 mg total) by mouth at bedtime as needed for sleep. 30 tablet 3   Current Facility-Administered Medications  Medication Dose Route Frequency Provider Last Rate Last Admin   0.9 %  sodium chloride infusion  500 mL Intravenous Once Anahlia Iseminger, Carie Caddy, MD        Allergies as of 03/07/2023 - Review Complete 03/07/2023  Allergen Reaction Noted   Diphenhydramine hcl Hives and Other (See Comments) 07/05/2007   Diphenhydramine hcl Other (See Comments) and Hives 07/05/2007   Iodinated contrast media Shortness Of Breath, Nausea And Vomiting, Other (See Comments), and Anaphylaxis 07/01/2011   Chlorhexidine gluconate Hives 11/01/2017   Codeine Hives, Nausea And Vomiting, and Rash 07/05/2007    Family History  Adopted: Yes  Family history unknown: Yes    Social History   Socioeconomic History   Marital status: Married     Spouse name: Clifton Custard   Number of children: 1   Years of education: Not on file   Highest education level: Not on file  Occupational History   Occupation: Print production planner  Tobacco Use   Smoking status: Never   Smokeless tobacco: Never  Vaping Use   Vaping Use: Never used  Substance and Sexual Activity   Alcohol use: Yes    Comment: ocassional - social 1- 2 drinks   Drug use: No   Sexual activity: Yes    Birth control/protection: I.U.D.  Other Topics Concern   Not on file  Social History Narrative   Patient lives with husband and one child, a son named Max.    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    Social Determinants of Health   Financial Resource Strain: Not on file  Food Insecurity: Not on file  Transportation Needs: Not on  file  Physical Activity: Not on file  Stress: Not on file  Social Connections: Not on file  Intimate Partner Violence: Not on file    Physical Exam: Vital signs in last 24 hours: @BP  130/76   Pulse 75   Temp 98 F (36.7 C)   Ht 5\' 5"  (1.651 m)   Wt 234 lb (106.1 kg)   SpO2 98%   BMI 38.94 kg/m  GEN: NAD EYE: Sclerae anicteric ENT: MMM CV: Non-tachycardic Pulm: CTA b/l GI: Soft, NT/ND NEURO:  Alert & Oriented x 3   Erick Blinks, MD [facility]  03/07/2023 1:32 PM

## 2023-03-07 NOTE — Progress Notes (Signed)
Vss nad trans to pacu 

## 2023-03-07 NOTE — Progress Notes (Signed)
Called to room to assist during endoscopic procedure.  Patient ID and intended procedure confirmed with present staff. Received instructions for my participation in the procedure from the performing physician.  

## 2023-03-08 ENCOUNTER — Telehealth: Payer: Self-pay

## 2023-03-08 NOTE — Telephone Encounter (Signed)
Follow up call placed, VM obtained and message left. 

## 2023-03-10 ENCOUNTER — Encounter: Payer: Self-pay | Admitting: Internal Medicine

## 2023-03-11 ENCOUNTER — Other Ambulatory Visit (HOSPITAL_COMMUNITY): Payer: Self-pay

## 2023-03-11 ENCOUNTER — Telehealth (INDEPENDENT_AMBULATORY_CARE_PROVIDER_SITE_OTHER): Payer: 59 | Admitting: Nurse Practitioner

## 2023-03-11 VITALS — Wt 215.8 lb

## 2023-03-11 DIAGNOSIS — E669 Obesity, unspecified: Secondary | ICD-10-CM

## 2023-03-11 DIAGNOSIS — D582 Other hemoglobinopathies: Secondary | ICD-10-CM | POA: Diagnosis not present

## 2023-03-11 MED ORDER — TIRZEPATIDE 5 MG/0.5ML ~~LOC~~ SOAJ
5.0000 mg | SUBCUTANEOUS | 1 refills | Status: DC
Start: 2023-03-11 — End: 2023-03-24
  Filled 2023-03-11: qty 2, 28d supply, fill #0

## 2023-03-11 NOTE — Assessment & Plan Note (Addendum)
Chronic Is losing weight gradually, tolerating tirzepatide well.  Per shared decision-making patient will continue on 5 mg/week for at least an additional month.  At that point she may reach out to me to discuss possibly going up on the dose if needed.

## 2023-03-11 NOTE — Assessment & Plan Note (Signed)
Hemoglobin level elevated x 2, check iron and ferritin levels.  If elevated refer to hematology.

## 2023-03-11 NOTE — Progress Notes (Addendum)
   Established Patient Office Visit  An audio/visual tele-health visit was completed today for this patient. I connected with  Faythe Ghee on 07/08/23 utilizing audio/visual technology and verified that I am speaking with the correct person using two identifiers. The patient was located at a family members home, and I was located at the office of [facility] during the encounter. I discussed the limitations of evaluation and management by telemedicine. The patient expressed understanding and agreed to proceed.     Subjective   Patient ID: AYAA CZARNIK, female    DOB: 09-Jan-1976  Age: 47 y.o. MRN: [id number]  Chief Complaint  Patient presents with   Obesity    Obesity: On tirzepatide 5 mg/week.  Tolerating well.  Experiences some abdominal bloating but denies nausea vomiting.  Reports bowel movement every 3 days, does not feel that she has to strain.  Takes MiraLAX fiber Gummies and a multivitamin.  Self-reported weight today is 215#on at home scale Elevated hemoglobin: Incidental finding, 12/2022 15.7, 01/2023 15.5.  Reports having itching at times as well.  Adopted does not know family history.  Denies palmar erythema. No history of lung disease. Never smoker    ROS: see HPI    Objective:     Wt 215 lb 12.8 oz (97.9 kg)   BMI 35.91 kg/m  BP Readings from Last 3 Encounters:  05/17/23 114/80  05/17/23 118/72  05/11/23 124/70   Wt Readings from Last 3 Encounters:  05/11/23 203 lb 12.8 oz (92.4 kg)  04/18/23 209 lb 14.4 oz (95.2 kg)  03/11/23 215 lb 12.8 oz (97.9 kg)      Physical Exam Comprehensive physical exam not completed today as office visit was conducted remotely.  Appears well over video.  Patient was alert and oriented, and appeared to have appropriate judgment.   No results found for any visits on 03/11/23.    The 10-year ASCVD risk score (Arnett DK, et al., 2019) is: 0.6%    Assessment & Plan:   Problem List Items  Addressed This Visit       Other   Obesity (BMI 30-39.9)    Chronic Is losing weight gradually, tolerating tirzepatide well.  Per shared decision-making patient will continue on 5 mg/week for at least an additional month.  At that point she may reach out to me to discuss possibly going up on the dose if needed.      Elevated hemoglobin (HCC) - Primary    Hemoglobin level elevated x 2, check iron and ferritin levels.  If elevated refer to hematology.      Relevant Orders   Iron (Completed)   Ferritin (Completed)   CBC (Completed)    Return in about 4 months (around 07/11/2023) for CPE with Maralyn Sago.    Elenore Paddy, NP

## 2023-03-15 ENCOUNTER — Other Ambulatory Visit (HOSPITAL_COMMUNITY): Payer: Self-pay

## 2023-03-16 DIAGNOSIS — Z87442 Personal history of urinary calculi: Secondary | ICD-10-CM

## 2023-03-16 HISTORY — DX: Personal history of urinary calculi: Z87.442

## 2023-03-21 ENCOUNTER — Other Ambulatory Visit (HOSPITAL_COMMUNITY): Payer: Self-pay

## 2023-03-21 ENCOUNTER — Encounter: Payer: Self-pay | Admitting: Nurse Practitioner

## 2023-03-24 ENCOUNTER — Other Ambulatory Visit (HOSPITAL_BASED_OUTPATIENT_CLINIC_OR_DEPARTMENT_OTHER): Payer: Self-pay

## 2023-03-24 ENCOUNTER — Other Ambulatory Visit: Payer: Self-pay | Admitting: Nurse Practitioner

## 2023-03-24 DIAGNOSIS — E669 Obesity, unspecified: Secondary | ICD-10-CM

## 2023-03-24 MED ORDER — ZEPBOUND 5 MG/0.5ML ~~LOC~~ SOAJ
5.0000 mg | SUBCUTANEOUS | 2 refills | Status: DC
Start: 2023-03-24 — End: 2023-03-24
  Filled 2023-03-24: qty 2, 28d supply, fill #0

## 2023-03-24 MED ORDER — TIRZEPATIDE 5 MG/0.5ML ~~LOC~~ SOAJ
5.0000 mg | SUBCUTANEOUS | 1 refills | Status: DC
Start: 2023-03-24 — End: 2024-01-27
  Filled 2023-03-24: qty 2, 28d supply, fill #0

## 2023-03-24 MED ORDER — ZEPBOUND 5 MG/0.5ML ~~LOC~~ SOAJ
5.0000 mg | SUBCUTANEOUS | 2 refills | Status: DC
Start: 2023-03-24 — End: 2023-04-18
  Filled 2023-03-24 (×2): qty 2, 28d supply, fill #0

## 2023-03-29 ENCOUNTER — Other Ambulatory Visit (HOSPITAL_BASED_OUTPATIENT_CLINIC_OR_DEPARTMENT_OTHER): Payer: Self-pay

## 2023-03-29 ENCOUNTER — Telehealth: Payer: 59 | Admitting: Nurse Practitioner

## 2023-03-29 ENCOUNTER — Other Ambulatory Visit (HOSPITAL_COMMUNITY): Payer: Self-pay

## 2023-03-29 DIAGNOSIS — J0100 Acute maxillary sinusitis, unspecified: Secondary | ICD-10-CM

## 2023-03-29 DIAGNOSIS — H6690 Otitis media, unspecified, unspecified ear: Secondary | ICD-10-CM | POA: Diagnosis not present

## 2023-03-29 MED ORDER — AMOXICILLIN-POT CLAVULANATE 875-125 MG PO TABS
1.0000 | ORAL_TABLET | Freq: Two times a day (BID) | ORAL | 0 refills | Status: DC
Start: 2023-03-29 — End: 2023-04-09
  Filled 2023-03-29: qty 20, 10d supply, fill #0

## 2023-03-29 NOTE — Progress Notes (Signed)
E-Visit for Ear Pain - Acute Otitis Media   We are sorry that you are not feeling well. Here is how we plan to help!  Based on what you have shared with me it looks like you have Acute Otitis Media.  Acute Otitis Media is an infection of the middle or "inner" ear. This type of infection can cause redness, inflammation, and fluid buildup behind the tympanic membrane (ear drum).  The usual symptoms include: Earache/Pain Fever Upper respiratory symptoms Lack of energy/Fatigue/Malaise Slight hearing loss gradually worsening- if the inner ear fills with fluid What causes middle ear infections? Most middle ear infections occur when an infection such as a cold, leads to a build-up of mucus in the middle ear and causes the Eustachian tube (a thin tube that runs from the middle ear to the back of the nose) to become swollen or blocked.   This means mucus can't drain away properly, making it easier for an infection to spread into the middle ear.  How middle ear infections are treated: Most ear infections clear up within three to five days and don't need any specific treatment. If necessary, tylenol or ibuprofen should be used to relieve pain and a high temperature.  If you develop a fever higher than 102, or any significantly worsening symptoms, this could indicate a more serious infection moving to the middle/inner and needs face to face evaluation in an office by a provider.   Antibiotics aren't routinely used to treat middle ear infections, although they may occasionally be prescribed if symptoms persist or are particularly severe. Given your presentation,   I have prescribed Augmentin 875-125 mg one tablet by mouth twice a day for 10 days (This will also cover a sinus infection)     Your symptoms should improve over the next 3 days and should resolve in about 7 days. Be sure to complete ALL of the prescription(s) given.  HOME CARE: Wash your hands frequently. If you are prescribed an ear  drop, do not place the tip of the bottle on your ear or touch it with your fingers. You can take Acetaminophen 650 mg every 4-6 hours as needed for pain.  If pain is severe or moderate, you can apply a heating pad (set on low) or hot water bottle (wrapped in a towel) to outer ear for 20 minutes.  This will also increase drainage.  GET HELP RIGHT AWAY IF: Fever is over 102.2 degrees. You develop progressive ear pain or hearing loss. Ear symptoms persist longer than 3 days after treatment.  MAKE SURE YOU: Understand these instructions. Will watch your condition. Will get help right away if you are not doing well or get worse.  Thank you for choosing an e-visit.  Your e-visit answers were reviewed by a board certified advanced clinical practitioner to complete your personal care plan. Depending upon the condition, your plan could have included both over the counter or prescription medications.  Please review your pharmacy choice. Make sure the pharmacy is open so you can pick up the prescription now. If there is a problem, you may contact your provider through [employer] and have the prescription routed to another pharmacy.  Your safety is important to [place]. If you have drug allergies check your prescription carefully.   For the next 24 hours you can use MyChart to ask questions about today's visit, request a non-urgent call back, or ask for a work or school excuse. You will get an email with a survey after your  eVisit asking about your experience. We would appreciate your feedback. I hope that your e-visit has been valuable and will aid in your recovery.  Meds ordered this encounter  Medications   amoxicillin-clavulanate (AUGMENTIN) 875-125 MG tablet    Sig: Take 1 tablet by mouth 2 (two) times daily.    Dispense:  20 tablet    Refill:  0     I spent approximately 5 minutes reviewing the patient's history, current symptoms and coordinating their care today.

## 2023-04-05 ENCOUNTER — Other Ambulatory Visit (HOSPITAL_BASED_OUTPATIENT_CLINIC_OR_DEPARTMENT_OTHER): Payer: Self-pay

## 2023-04-06 ENCOUNTER — Other Ambulatory Visit (HOSPITAL_BASED_OUTPATIENT_CLINIC_OR_DEPARTMENT_OTHER): Payer: Self-pay

## 2023-04-07 ENCOUNTER — Ambulatory Visit: Payer: 59 | Admitting: Family Medicine

## 2023-04-07 ENCOUNTER — Ambulatory Visit
Admission: RE | Admit: 2023-04-07 | Discharge: 2023-04-07 | Disposition: A | Discharge status: Home / Self Care | Payer: 59 | Source: Ambulatory Visit | Attending: Nurse Practitioner | Admitting: Nurse Practitioner

## 2023-04-07 ENCOUNTER — Telehealth: Payer: 59 | Admitting: Physician Assistant

## 2023-04-07 VITALS — BP 114/78 | HR 81 | Temp 98.4°F | Resp 16

## 2023-04-07 DIAGNOSIS — N3001 Acute cystitis with hematuria: Secondary | ICD-10-CM | POA: Diagnosis not present

## 2023-04-07 DIAGNOSIS — R3989 Other symptoms and signs involving the genitourinary system: Secondary | ICD-10-CM

## 2023-04-07 LAB — POCT URINALYSIS DIP (MANUAL ENTRY)
Glucose, UA: 100 mg/dL — AB
Nitrite, UA: POSITIVE — AB
Protein Ur, POC: >=300 mg/dL — AB
Spec Grav, UA: 1.025 (ref 1.010–1.025)
Urobilinogen, UA: 4 E.U./dL — AB
pH, UA: 5 (ref 5.0–8.0)

## 2023-04-07 LAB — POCT URINE PREGNANCY: Preg Test, Ur: NEGATIVE

## 2023-04-07 MED ORDER — NITROFURANTOIN MONOHYD MACRO 100 MG PO CAPS
100.0000 mg | ORAL_CAPSULE | Freq: Two times a day (BID) | ORAL | 0 refills | Status: DC
Start: 2023-04-07 — End: 2023-04-09

## 2023-04-07 NOTE — Progress Notes (Signed)
Because of recent antibiotic use and concern this UTI could be resistant to antibiotics, I feel your condition warrants further evaluation and I recommend that you be seen in a face to face visit. This way they can assess urine to know what antibiotics will be most effective and which may not work as well for this infection. This is important because we want to take care of infection early to minimize risk of it tracking up to your kidneys. I would first reach out to PCP office to see if they can have you give urine sample. If unavailable, I recommend being seen in person at one of the facilities below.    NOTE: There will be NO CHARGE for this eVisit   If you are having a true medical emergency please call 911.      For an urgent face to face visit, [place] has eight urgent care centers for your convenience:   NEW!! [facility] Center at [facility] Get Driving Directions [phone] [address] [place], [address]    [facility] Health [facility]gent Care Center at [facility] Get Driving Directions [phone] [address] [place], [place] [address]   [facility] Health Urgent Care Center [facility]) Get Driving Directions [phone] [address] [place], [place] [address]  [facility] Health Urgent Care Center [facility]) Get Driving Directions [phone] [address] [place],  [place]  [address]  [facility] Health Urgent Care Center [facility] - at [employer]  [phone] [address] [place],  [place] [address]   [facility] Health Urgent Care at [facility] Get Driving Directions [phone] [address] [place], [place] [address]   [facility] Health Urgent Care at [facility] Get Driving Directions  [phone] [address] [place], [place] [address]   [facility] Health Urgent Care at [facility]  Directions [phone] [address] [place], [place] [address]  Your MyChart E-visit questionnaire answers were reviewed by a board certified advanced clinical practitioner to complete your personal care plan based on your specific symptoms.  Thank you for using e-Visits.

## 2023-04-07 NOTE — ED Triage Notes (Signed)
Pt presents to UC w/ c/o urinary frequency, urgency, burning, dysuria x2 days. Started Azo yesterday.

## 2023-04-07 NOTE — ED Provider Notes (Signed)
[facility]    CSN: [id number] Arrival date & time: 04/07/23  1818      History   Chief Complaint Chief Complaint  Patient presents with   Urinary Frequency    Burning and pain x2 days. Started Azo this morning to get relief. - Entered by patient    HPI Emma Mathews is a 47 y.o. female patient of dysuria.  Patient reports 2 days of urinary burning, urgency, frequency.  Denies any fevers, hematuria, nausea/vomiting, flank pain.  No vaginal discharge or STD concern.  She has been taking Azo OTC.  She also reports she completed a round of Augmentin 2 days ago for an ear infection.  No other concerns at this time.   Urinary Frequency    Past Medical History:  Diagnosis Date   Anemia    Anxiety    Appendicitis, acute 10/02/2013   Cancer (HCC)    melanoma excision in 2013   Cholecystitis    Complication of anesthesia    slow to wake up   Depression    Encounter for lipid screening for cardiovascular disease 01/13/2023   Headache    migraine   Mammographic breast lesion 06/28/2017   S/p biopsy, benign    Pneumonia 2010   PONV (postoperative nausea and vomiting)    Seasonal allergies    STRESS FRACTURE, FOOT 04/08/2009   Qualifier: Diagnosis of  By: Lovell Sheehan MD, Balinda Quails    Vision abnormalities     Patient Active Problem List   Diagnosis Date Noted   Elevated hemoglobin (HCC) 02/03/2023   Encounter for lipid screening for cardiovascular disease 01/13/2023   Diabetes mellitus screening 01/13/2023   Obesity (BMI 30-39.9) 01/13/2023   Fatigue 06/08/2022   Pain of joint of left ankle and foot 03/10/2022   Acquired equinus deformity of left foot 01/08/2020   Disorder of Eustachian tube, right 12/21/2019   Low back pain 10/26/2019   Plantar fasciitis, left 06/05/2019   Chronic migraine with aura 03/27/2018   Obesity 02/01/2018   Headache 02/01/2018   Motion sickness 02/01/2018   History of migraine 02/01/2018   Degenerative TFCC tear, right 10/25/2017    ADD 10/28/2009   ANEMIA-UNSPECIFIED 04/28/2009   ALLERGIC RHINITIS 02/25/2009    Past Surgical History:  Procedure Laterality Date   CHOLECYSTECTOMY     EYE SURGERY Bilateral    PRK   Labrum Repair Right 11/15/1998   Open   LAPAROSCOPIC APPENDECTOMY N/A 09/02/2013   Procedure: APPENDECTOMY LAPAROSCOPIC;  Surgeon: Shelly Rubenstein, MD;  Location: [facility] OR;  Service: General;  Laterality: N/A;   SHOULDER ARTHROSCOPY Left 11/16/2003   bone spurs    SHOULDER ARTHROSCOPY Right 11/01/2017   Procedure: RIGHT SHOULDER ARTHROSCOPY WITH SUBACROMIAL DECOMPRESSION;  Surgeon: Cammy Copa, MD;  Location: [facility] OR;  Service: Orthopedics;  Laterality: Right;   SHOULDER ARTHROSCOPY WITH SUBACROMIAL DECOMPRESSION, ROTATOR CUFF REPAIR AND BICEP TENDON REPAIR Right 02/24/2016   Procedure: RIGHT SHOULDER ARTHROSCOPY WITH DEBRIDEMENT, BICEPS TENODESIS;  Surgeon: Cammy Copa, MD;  Location: [facility] OR;  Service: Orthopedics;  Laterality: Right;   SHOULDER SURGERY Right 11/15/2005   Revision and repair flap   WISDOM TOOTH EXTRACTION     WRIST ARTHROSCOPY Right 11/09/2017   Procedure: ARTHROSCOPY WRIST WITH DEBRIDEMENT;  Surgeon: Dairl Ponder, MD;  Location: [facility];  Service: Orthopedics;  Laterality: Right;    OB History   No obstetric history on file.      Home Medications    Prior to Admission  medications   Medication Sig Start Date End Date Taking? Authorizing Provider  nitrofurantoin, macrocrystal-monohydrate, (MACROBID) 100 MG capsule Take 1 capsule (100 mg total) by mouth 2 (two) times daily for 7 days. 04/07/23 04/14/23 Yes Radford Pax, NP  amoxicillin-clavulanate (AUGMENTIN) 875-125 MG tablet Take 1 tablet by mouth 2 (two) times daily. 03/29/23   Viviano Simas, FNP  hydrOXYzine (ATARAX) 10 MG tablet Take 0.5-1 tablets (5-10 mg total) by mouth 3 (three) times daily as needed. Patient taking differently: Take 5-10 mg by mouth as needed. 09/14/22   Sheliah Hatch, MD  levonorgestrel (MIRENA) 20 MCG/24HR IUD Mirena 20 mcg/24 hours (5 yrs) 52 mg intrauterine device    [provider]  mometasone (NASONEX) 50 MCG/ACT nasal spray Nasonex    [provider]  Multiple Vitamins-Minerals (MULTIPLE VITAMINS/WOMENS PO) Multiple Vitamin, Womens    [provider]  pantoprazole (PROTONIX) 40 MG tablet Take 1 tablet (40 mg total) by mouth daily. Take 30 minutes before breakfast. 01/06/23   Arnaldo Natal, NP  rizatriptan (MAXALT-MLT) 10 MG disintegrating tablet DISSOLVE 1 TABLET (10 MG TOTAL) BY MOUTH AS NEEDED. MAY REPEAT IN 2 HOURS IF NEEDED 09/12/19   Levert Feinstein, MD  SUMAtriptan 6 MG/0.5ML SOAJ Inject 6mg  at onset of migraine.  May repeat in 2 hrs, if needed.  Max dose: 2 injections/day. This is a 30 day prescription. 03/31/18   Levert Feinstein, MD  tirzepatide [facility]) 5 MG/0.5ML Pen Inject 5 mg into the skin once a week. 03/24/23   Elenore Paddy, NP  tirzepatide (ZEPBOUND) 5 MG/0.5ML Pen Inject 5 mg into the skin once a week. 03/24/23   Elenore Paddy, NP  traZODone (DESYREL) 50 MG tablet Take 1/2 to 1 tablet (25-50 mg total) by mouth at bedtime as needed for sleep. 07/24/21   Janeece Agee, NP    Family History Family History  Adopted: Yes  Family history unknown: Yes    Social History Social History   Tobacco Use   Smoking status: Never   Smokeless tobacco: Never  Vaping Use   Vaping Use: Never used  Substance Use Topics   Alcohol use: Yes    Comment: ocassional - social 1- 2 drinks   Drug use: No     Allergies   Diphenhydramine hcl, Diphenhydramine hcl, Iodinated contrast media, Chlorhexidine gluconate, and Codeine   Review of Systems Review of Systems  Genitourinary:  Positive for dysuria and frequency.     Physical Exam Triage Vital Signs ED Triage Vitals  Enc Vitals Group     BP 04/07/23 1826 114/78     Pulse Rate 04/07/23 1826 81     Resp 04/07/23 1826 16     Temp 04/07/23 1826 98.4 F  (36.9 C)     Temp Source 04/07/23 1826 Oral     SpO2 04/07/23 1826 97 %     Weight --      Height --      Head Circumference --      Peak Flow --      Pain Score 04/07/23 1828 2     Pain Loc --      Pain Edu? --      Excl. in GC? --    No data found.  Updated Vital Signs BP 114/78 (BP Location: Left Arm)   Pulse 81   Temp 98.4 F (36.9 C) (Oral)   Resp 16   SpO2 97%   Visual Acuity Right Eye Distance:   Left Eye  Distance:   Bilateral Distance:    Right Eye Near:   Left Eye Near:    Bilateral Near:     Physical Exam Vitals and nursing note reviewed.  Constitutional:      Appearance: Normal appearance.  HENT:     Head: Normocephalic and atraumatic.  Eyes:     Pupils: Pupils are equal, round, and reactive to light.  Cardiovascular:     Rate and Rhythm: Normal rate.  Pulmonary:     Effort: Pulmonary effort is normal.  Abdominal:     Tenderness: There is no right CVA tenderness or left CVA tenderness.  Skin:    General: Skin is warm and dry.  Neurological:     General: No focal deficit present.     Mental Status: She is alert and oriented to person, place, and time.  Psychiatric:        Mood and Affect: Mood normal.        Behavior: Behavior normal.      UC Treatments / Results  Labs (all labs ordered are listed, but only abnormal results are displayed) Labs Reviewed  POCT URINALYSIS DIP (MANUAL ENTRY) - Abnormal; Notable for the following components:      Result Value   Color, UA orange (*)    Clarity, UA hazy (*)    Glucose, UA =100 (*)    Bilirubin, UA small (*)    Ketones, POC UA trace (5) (*)    Blood, UA moderate (*)    Protein Ur, POC >=300 (*)    Urobilinogen, UA 4.0 (*)    Nitrite, UA Positive (*)    Leukocytes, UA Large (3+) (*)    All other components within normal limits  URINE CULTURE  POCT URINE PREGNANCY    EKG   Radiology No results found.  Procedures Procedures (including critical care time)  Medications Ordered in  UC Medications - No data to display  Initial Impression / Assessment and Plan / UC Course  I have reviewed the triage vital signs and the nursing notes.  Pertinent labs & imaging results that were available during my care of the patient were reviewed by me and considered in my medical decision making (see chart for details).     Patient Took Azo.  Will send urine culture and treat based on symptoms.  Start Macrobid Rest and fluids PCP follow-up if symptoms do not improve ER precautions reviewed Final Clinical Impressions(s) / UC Diagnoses   Final diagnoses:  Acute cystitis with hematuria     Discharge Instructions      We will send her urine for culture and contact you with those results.  Start Macrobid twice daily for 7 days to treat your UTI You can continue over-the-counter Azo as needed Rest and fluids Follow-up with your PCP if your symptoms do not improve Please go to the ER for any worsening symptoms    ED Prescriptions     Medication Sig Dispense Auth. Provider   nitrofurantoin, macrocrystal-monohydrate, (MACROBID) 100 MG capsule Take 1 capsule (100 mg total) by mouth 2 (two) times daily for 7 days. 14 capsule Radford Pax, NP      PDMP not reviewed this encounter.   Radford Pax, NP 04/07/23 [phone]

## 2023-04-07 NOTE — Discharge Instructions (Signed)
We will send her urine for culture and contact you with those results.  Start Macrobid twice daily for 7 days to treat your UTI You can continue over-the-counter Azo as needed Rest and fluids Follow-up with your PCP if your symptoms do not improve Please go to the ER for any worsening symptoms

## 2023-04-08 ENCOUNTER — Ambulatory Visit: Payer: 59 | Admitting: Internal Medicine

## 2023-04-09 ENCOUNTER — Emergency Department (HOSPITAL_BASED_OUTPATIENT_CLINIC_OR_DEPARTMENT_OTHER)
Admission: EM | Admit: 2023-04-09 | Discharge: 2023-04-09 | Disposition: A | Discharge status: Home / Self Care | Payer: 59 | Attending: Emergency Medicine | Admitting: Emergency Medicine

## 2023-04-09 ENCOUNTER — Emergency Department (HOSPITAL_BASED_OUTPATIENT_CLINIC_OR_DEPARTMENT_OTHER): Payer: 59

## 2023-04-09 ENCOUNTER — Encounter (HOSPITAL_BASED_OUTPATIENT_CLINIC_OR_DEPARTMENT_OTHER): Payer: Self-pay | Admitting: Emergency Medicine

## 2023-04-09 ENCOUNTER — Telehealth: Payer: 59 | Admitting: Family

## 2023-04-09 ENCOUNTER — Other Ambulatory Visit (HOSPITAL_COMMUNITY): Payer: Self-pay

## 2023-04-09 ENCOUNTER — Other Ambulatory Visit: Payer: Self-pay

## 2023-04-09 DIAGNOSIS — R109 Unspecified abdominal pain: Secondary | ICD-10-CM

## 2023-04-09 DIAGNOSIS — N132 Hydronephrosis with renal and ureteral calculous obstruction: Secondary | ICD-10-CM | POA: Diagnosis not present

## 2023-04-09 DIAGNOSIS — N1330 Unspecified hydronephrosis: Secondary | ICD-10-CM | POA: Diagnosis not present

## 2023-04-09 DIAGNOSIS — N201 Calculus of ureter: Secondary | ICD-10-CM

## 2023-04-09 DIAGNOSIS — N3000 Acute cystitis without hematuria: Secondary | ICD-10-CM | POA: Insufficient documentation

## 2023-04-09 DIAGNOSIS — N3001 Acute cystitis with hematuria: Secondary | ICD-10-CM

## 2023-04-09 DIAGNOSIS — N949 Unspecified condition associated with female genital organs and menstrual cycle: Secondary | ICD-10-CM | POA: Insufficient documentation

## 2023-04-09 LAB — PREGNANCY, URINE: Preg Test, Ur: NEGATIVE

## 2023-04-09 LAB — URINALYSIS, ROUTINE W REFLEX MICROSCOPIC
Bilirubin Urine: NEGATIVE
Glucose, UA: NEGATIVE mg/dL
Hgb urine dipstick: NEGATIVE
Ketones, ur: 40 mg/dL — AB
Nitrite: NEGATIVE
Protein, ur: NEGATIVE mg/dL
Specific Gravity, Urine: 1.016 (ref 1.005–1.030)
pH: 6.5 (ref 5.0–8.0)

## 2023-04-09 LAB — BASIC METABOLIC PANEL
Anion gap: 8 (ref 5–15)
BUN: 14 mg/dL (ref 6–20)
CO2: 25 mmol/L (ref 22–32)
Calcium: 9.8 mg/dL (ref 8.9–10.3)
Chloride: 103 mmol/L (ref 98–111)
Creatinine, Ser: 0.72 mg/dL (ref 0.44–1.00)
GFR, Estimated: >60 mL/min (ref >60)
Glucose, Bld: 85 mg/dL (ref 70–99)
Potassium: 4.5 mmol/L (ref 3.5–5.1)
Sodium: 136 mmol/L (ref 135–145)

## 2023-04-09 LAB — CBC
HCT: 42.8 % (ref 36.0–46.0)
Hemoglobin: 14.9 g/dL (ref 12.0–15.0)
MCH: 29.3 pg (ref 26.0–34.0)
MCHC: 34.8 g/dL (ref 30.0–36.0)
MCV: 84.3 fL (ref 80.0–100.0)
Platelets: 202 10*3/uL (ref 150–400)
RBC: 5.08 MIL/uL (ref 3.87–5.11)
RDW: 13.6 % (ref 11.5–15.5)
WBC: 6.8 10*3/uL (ref 4.0–10.5)
nRBC: 0 % (ref 0.0–0.2)

## 2023-04-09 LAB — URINE CULTURE

## 2023-04-09 MED ORDER — KETOROLAC TROMETHAMINE 15 MG/ML IJ SOLN
15.0000 mg | Freq: Once | INTRAMUSCULAR | Status: AC
  Administered 2023-04-09: 15 mg via INTRAVENOUS
  Filled 2023-04-09: qty 1

## 2023-04-09 MED ORDER — ONDANSETRON HCL 4 MG/2ML IJ SOLN
4.0000 mg | Freq: Once | INTRAMUSCULAR | Status: AC
  Administered 2023-04-09: 4 mg via INTRAVENOUS
  Filled 2023-04-09: qty 2

## 2023-04-09 MED ORDER — CEFDINIR 300 MG PO CAPS: 300.0000 mg | ORAL_CAPSULE | Freq: Two times a day (BID) | ORAL | 0 refills | Status: DC

## 2023-04-09 MED ORDER — OXYCODONE-ACETAMINOPHEN 5-325 MG PO TABS: 1.0000 | ORAL_TABLET | Freq: Four times a day (QID) | ORAL | 0 refills | Status: DC | PRN

## 2023-04-09 MED ORDER — CIPROFLOXACIN HCL 500 MG PO TABS
500.0000 mg | ORAL_TABLET | Freq: Two times a day (BID) | ORAL | 0 refills | Status: DC
Start: 2023-04-09 — End: 2023-04-09
  Filled 2023-04-09: qty 10, 5d supply, fill #0

## 2023-04-09 MED ORDER — ONDANSETRON 4 MG PO TBDP: 4.0000 mg | ORAL_TABLET | Freq: Three times a day (TID) | ORAL | 0 refills | Status: DC | PRN

## 2023-04-09 MED ORDER — SODIUM CHLORIDE 0.9 % IV SOLN
1.0000 g | Freq: Once | INTRAVENOUS | Status: AC
  Administered 2023-04-09: 1 g via INTRAVENOUS
  Filled 2023-04-09: qty 10

## 2023-04-09 MED ORDER — TAMSULOSIN HCL 0.4 MG PO CAPS: 0.4000 mg | ORAL_CAPSULE | Freq: Every day | ORAL | 0 refills | Status: DC

## 2023-04-09 NOTE — ED Provider Notes (Signed)
[place] EMERGENCY DEPARTMENT AT [facility] Provider Note   CSN: [id number] Arrival date & time: 04/09/23  1108     History  Chief Complaint  Patient presents with   Flank Pain    Emma Mathews is a 47 y.o. female.  Patient with history of kidney stones, and previous cholecystectomy and appendectomy --presents to the emergency department today for evaluation of right flank pain.  Patient had some irritative UTI symptoms starting about 4 days ago.  She went to urgent care 2 days ago and was started on Macrobid for UTI.  She has been taking Azo at home.  Symptoms included burning with urination, increased frequency and urgency.  However over the last 24 hours she developed pain in her right mid back which has been severe and associate with nausea, no vomiting.  She was concerned that she had an associated kidney stone.  She did have a televisit today and was changed to Cipro for potential kidney infection, but decided to come in for further evaluation given that her pain worsened late this morning.  No fevers, chest pain, shortness of breath.  1 previous kidney stone passed spontaneously after several days.       Home Medications Prior to Admission medications   Medication Sig Start Date End Date Taking? Authorizing Provider  ciprofloxacin (CIPRO) 500 MG tablet Take 1 tablet (500 mg total) by mouth 2 (two) times daily for 5 days. 04/09/23 04/14/23  Junie Spencer, FNP  hydrOXYzine (ATARAX) 10 MG tablet Take 0.5-1 tablets (5-10 mg total) by mouth 3 (three) times daily as needed. Patient taking differently: Take 5-10 mg by mouth as needed. 09/14/22   Sheliah Hatch, MD  levonorgestrel (MIRENA) 20 MCG/24HR IUD Mirena 20 mcg/24 hours (5 yrs) 52 mg intrauterine device    [provider]  mometasone (NASONEX) 50 MCG/ACT nasal spray Nasonex    [provider]  Multiple Vitamins-Minerals (MULTIPLE VITAMINS/WOMENS PO) Multiple Vitamin, Womens    [provider]  pantoprazole (PROTONIX) 40 MG tablet Take 1 tablet (40 mg total) by mouth daily. Take 30 minutes before breakfast. 01/06/23   Arnaldo Natal, NP  rizatriptan (MAXALT-MLT) 10 MG disintegrating tablet DISSOLVE 1 TABLET (10 MG TOTAL) BY MOUTH AS NEEDED. MAY REPEAT IN 2 HOURS IF NEEDED 09/12/19   Levert Feinstein, MD  SUMAtriptan 6 MG/0.5ML SOAJ Inject 6mg  at onset of migraine.  May repeat in 2 hrs, if needed.  Max dose: 2 injections/day. This is a 30 day prescription. 03/31/18   Levert Feinstein, MD  tirzepatide [facility]) 5 MG/0.5ML Pen Inject 5 mg into the skin once a week. 03/24/23   Elenore Paddy, NP  tirzepatide (ZEPBOUND) 5 MG/0.5ML Pen Inject 5 mg into the skin once a week. 03/24/23   Elenore Paddy, NP  traZODone (DESYREL) 50 MG tablet Take 1/2 to 1 tablet (25-50 mg total) by mouth at bedtime as needed for sleep. 07/24/21   Janeece Agee, NP      Allergies    Diphenhydramine hcl, Diphenhydramine hcl, Iodinated contrast media, Chlorhexidine gluconate, and Codeine    Review of Systems   Review of Systems  Physical Exam Updated Vital Signs BP 109/66   Pulse (!) 57   Temp 98.2 F (36.8 C) (Oral)   Resp 20   SpO2 99%  Physical Exam Vitals and nursing note reviewed.  Constitutional:      General: She is not in acute distress.    Appearance: She is well-developed.  HENT:  Head: Normocephalic and atraumatic.     Right Ear: External ear normal.     Left Ear: External ear normal.     Nose: Nose normal.  Eyes:     Conjunctiva/sclera: Conjunctivae normal.  Cardiovascular:     Rate and Rhythm: Normal rate and regular rhythm.     Heart sounds: No murmur heard. Pulmonary:     Effort: No respiratory distress.     Breath sounds: No wheezing, rhonchi or rales.  Abdominal:     Palpations: Abdomen is soft.     Tenderness: There is no abdominal tenderness. There is right CVA tenderness. There is no left CVA tenderness, guarding or rebound.  Musculoskeletal:     Cervical back:  Normal range of motion and neck supple.     Right lower leg: No edema.     Left lower leg: No edema.  Skin:    General: Skin is warm and dry.     Findings: No rash.  Neurological:     General: No focal deficit present.     Mental Status: She is alert. Mental status is at baseline.     Motor: No weakness.  Psychiatric:        Mood and Affect: Mood normal.     ED Results / Procedures / Treatments   Labs (all labs ordered are listed, but only abnormal results are displayed) Labs Reviewed  URINALYSIS, ROUTINE W REFLEX MICROSCOPIC - Abnormal; Notable for the following components:      Result Value   Ketones, ur 40 (*)    Leukocytes,Ua SMALL (*)    Bacteria, UA FEW (*)    All other components within normal limits  PREGNANCY, URINE  BASIC METABOLIC PANEL  CBC    EKG None  Radiology CT Renal Stone Study  Result Date: 04/09/2023 CLINICAL DATA:  Abdominal/flank pain. Stone suspected. Right flank pain. Pain began of sudden last night. Pain with urination began 4 days ago. 2 days ago was started on an antibiotic at urgent care for urinary tract infection. Patient was feeling better yesterday until last night back pain began that moved to flank this morning. History of kidney stones. EXAM: CT ABDOMEN AND PELVIS WITHOUT CONTRAST TECHNIQUE: Multidetector CT imaging of the abdomen and pelvis was performed following the standard protocol without IV contrast. RADIATION DOSE REDUCTION: This exam was performed according to the departmental dose-optimization program which includes automated exposure control, adjustment of the mA and/or kV according to patient size and/or use of iterative reconstruction technique. COMPARISON:  CT abdomen pelvis with contrast 09/02/2013 FINDINGS: Lower chest: Unremarkable.  Heart size is normal. Lack of intra-articular fluid limits evaluation of the abdominal and pelvic organ parenchyma. The following findings are made within this limitation. Hepatobiliary: Smooth liver  contours. No gross liver lesion is seen. Cholecystectomy clips are again seen. Pancreas: Grossly unremarkable. No gross pancreatic ductal dilatation or surrounding inflammatory changes. Spleen: Normal in size without focal abnormality. Adrenals/Urinary Tract: Normal adrenals. There is a 7 mm stone at the ureteropelvic junction (axial series 2, image 35 and coronal series 5, image 54) with moderate upstream right hydronephrosis. The more distal right ureter and left ureter both normal in caliber. The urinary bladder is decompressed, markedly limiting evaluation. No stone is seen within either kidney. Stomach/Bowel: No bowel wall thickening. The terminal ileum is unremarkable. Interval appendectomy with surgical suture seen at the posterior aspect of the cecum. No dilated loops of bowel to indicate bowel obstruction. Vascular/Lymphatic: No abdominal aortic aneurysm. No enlarged abdominal or  pelvic lymph nodes. Reproductive: The uterus is present. An IUD is again seen. The uterus is anteverted and the left arm of the IUD appears to come within 2 mm of the anterior left uterine fundal wall (axial series 2, image 67 and sagittal series 6, image 78). Previously the IUD was more symmetric within the endometrial canal at the uterine fundus on prior remote 09/02/2013 CT. Findings are suspicious for IUD embedment/penetration into the myometrium. There is a fluid density likely left adnexal cyst measuring up to approximately 6.3 x 8.1 x 6.3 cm. Previously the largest left adnexal cyst measured 2.8 cm on prior remote 01/03/2013 CT. Other: No abdominal wall hernia. No free air or free fluid. Musculoskeletal: Moderate L4-5 posterior disc space narrowing is mildly worsened from prior. 2 mm retrolisthesis of L4 on L5 is similar to prior. IMPRESSION: 1. There is a 7 mm obstructing stone at the right ureteropelvic junction with moderate upstream right hydronephrosis. 2. The left arm of the patient's IUD appears to come within 2 mm of  the anterior left uterine fundal wall. Findings are suspicious for IUD embedment/penetration into the myometrium. 3. There is a 6.3 x 8.1 x 6.3 cm fluid density likely left adnexal cyst. Previously the largest left adnexal cyst measured 2.8 cm on prior remote 01/03/2013 CT. Consider follow-up pelvic ultrasound in 6-8 weeks to assess for resolution. 4. Interval appendectomy. 5. Status post cholecystectomy. 6. Moderate L4-5 posterior disc space narrowing is mildly worsened from prior. 2 mm retrolisthesis of L4 on L5 is similar to prior. Electronically Signed   By: Neita Garnet M.D.   On: 04/09/2023 16:40    Procedures Procedures    Medications Ordered in ED Medications  ketorolac (TORADOL) 15 MG/ML injection 15 mg (15 mg Intravenous Given 04/09/23 1422)  ondansetron (ZOFRAN) injection 4 mg (4 mg Intravenous Given 04/09/23 1422)  cefTRIAXone (ROCEPHIN) 1 g in sodium chloride 0.9 % 100 mL IVPB (1 g Intravenous New Bag/Given 04/09/23 1611)  ketorolac (TORADOL) 15 MG/ML injection 15 mg (15 mg Intravenous Given 04/09/23 1738)    ED Course/ Medical Decision Making/ A&P    Patient seen and examined. History obtained directly from patient.  Reviewed recent workup from urgent care, culture did grow out greater than 100,000 colonies of E. coli, sensitivity not yet available.  Labs/EKG: Ordered CBC, BMP, UA.  Imaging: Ordered CT renal protocol.  Noted, patient has IV contrast allergy.  Medications/Fluids: Ordered: IV Toradol, IV Zofran.   Most recent vital signs reviewed and are as follows: BP 109/66   Pulse (!) 57   Temp 98.2 F (36.8 C) (Oral)   Resp 20   SpO2 99%   Initial impression: UTI with question of kidney stone  5:37 PM Reassessment performed. Patient appears stable during several reassessments.  Pain is starting to return so we will repeat Toradol x 1.  Labs personally reviewed and interpreted including: CBC unremarkable with normal white blood cell count; BMP unremarkable with normal  kidney function; UA with some signs of infection, however as mentioned before recent urine culture is growing E. coli and sensitivities are not yet returned.  Pregnancy negative.  Imaging personally visualized and interpreted including: CT showing proximal right-sided ureteral stone 7 mm, also left adnexal cyst and possibility of embedded IUD.  Reviewed pertinent lab work and imaging with patient at bedside. Questions answered.  We did discuss the associated GYN findings.  Patient has a history of cysts which tend to rupture and cause pain so she was not surprised  to learn about her current left-sided cyst.  She is also due to have the IUD removed in August and replaced.  I have advised her to follow-up with her OB/GYN for these findings in the next couple of weeks to see if she requires any further workup.  Most current vital signs reviewed and are as follows: BP 109/66   Pulse (!) 57   Temp 98.2 F (36.8 C) (Oral)   Resp 20   SpO2 99%   Plan: Discharge to home.   Prescriptions written for: Cefdinir, Percocet # 10 tablets, Zofran, Flomax  Other home care instructions discussed: Hydration, rest  Patient counseled on kidney stone treatment. Urged patient to strain urine and save any stones. Urged urology follow-up and return to [facility] with any complications. Counseled patient to maintain good fluid intake.   We discussed signs symptoms which cause her to return.  We discussed importance of close monitoring as kidney infection in setting of ureteral stone would require urology intervention.  We discussed that she should return or go to [facility], ED if she develops a fever, uncontrolled pain, vomiting, or has other concerns regarding her current stone.  Counseled patient on use of Flomax.   Patient counseled on use of narcotic pain medications. Counseled not to combine these medications with others containing tylenol. Urged not to drink alcohol, drive, or perform any other activities that  requires focus while taking these medications. The patient verbalizes understanding and agrees with the plan.  Follow-up instructions discussed: Patient encouraged to follow-up with urology in 3 days.   Due to concern for UTI in setting of ureteral stone, I discussed case with attending physician Dr. Rhunette Croft who agrees with plan.  At this time, low concern for associated pyelonephritis.  Patient does not have fever, vomiting and her white blood cell count is normal.  Do not feel that emergent urologic potation is indicated at this time, but patient will need to watch her symptoms very closely.                            Medical Decision Making Amount and/or Complexity of Data Reviewed Labs: ordered. Radiology: ordered.  Risk Prescription drug management.   Patient with right-sided ureteral stone and flank pain.  Recent urine culture grew E. coli.  Patient will be placed on cefdinir to cover for pyelonephritis, however at this time have low suspicion for pyelo.  Patient is not vomiting and is afebrile.  Her white blood cell count is normal.  Clinically she has a UTI with right-sided ureteral colic.  She has responded well to medications here in the ED and has remained stable over 6-hour ED stay.  She seems reliable to follow-up and return if symptoms worsen.  She was given a dose of IV Rocephin and will be discharged home with symptom control.  The patient's vital signs, pertinent lab work and imaging were reviewed and interpreted as discussed in the ED course. Hospitalization was considered for further testing, treatments, or serial exams/observation. However as patient is well-appearing, has a stable exam, and reassuring studies today, I do not feel that they warrant admission at this time. This plan was discussed with the patient who verbalizes agreement and comfort with this plan and seems reliable and able to return to the Emergency Department with worsening or changing symptoms.           Final Clinical Impression(s) / ED Diagnoses Final diagnoses:  Right ureteral stone  Adnexal cyst  Acute cystitis without hematuria    Rx / DC Orders ED Discharge Orders          Ordered    cefdinir (OMNICEF) 300 MG capsule  2 times daily        04/09/23 1734    oxyCODONE-acetaminophen (PERCOCET/ROXICET) 5-325 MG tablet  Every 6 hours PRN        04/09/23 1734    ondansetron (ZOFRAN-ODT) 4 MG disintegrating tablet  Every 8 hours PRN        04/09/23 1734    tamsulosin (FLOMAX) 0.4 MG CAPS capsule  Daily        04/09/23 1734              Renne Crigler, PA-C 04/09/23 1742    Mardene Sayer, MD 04/09/23 1811

## 2023-04-09 NOTE — ED Triage Notes (Signed)
Pt presents to ED POV. Pt c/o R flank pain. Pain is a 5/10, is intermittently gets worsening, pain began all of the sudden last night. Pt reports that she began having pain w/ urination on Tuesday. On Thursday she was seen at [facility] and started on antibiotic for UTI. Pt reports feeling better yesterday until las tonight back pain began that moved to flank this morning. Hx of kidney stones

## 2023-04-09 NOTE — ED Provider Notes (Incomplete)
[place] EMERGENCY DEPARTMENT AT [facility] Provider Note   CSN: [id number] Arrival date & time: 04/09/23  1108     History {Add pertinent medical, surgical, social history, OB history to HPI:1} Chief Complaint  Patient presents with  . Flank Pain    Emma Mathews is a 47 y.o. female.  Patient with history of kidney stones, and previous cholecystectomy and appendectomy --presents to the emergency department today for evaluation of right flank pain.  Patient had some irritative UTI symptoms starting about 4 days ago.  She went to urgent care 2 days ago and was started on Macrobid for UTI.  She has been taking Azo at home.  Symptoms included burning with urination, increased frequency and urgency.  However over the last 24 hours she developed pain in her right mid back which has been severe and associate with nausea, no vomiting.  She was concerned that she had an associated kidney stone.  She did have a televisit today and was changed to Cipro for potential kidney infection, but decided to come in for further evaluation given that her pain worsened late this morning.  No fevers, chest pain, shortness of breath.  1 previous kidney stone passed spontaneously after several days.       Home Medications Prior to Admission medications   Medication Sig Start Date End Date Taking? Authorizing Provider  ciprofloxacin (CIPRO) 500 MG tablet Take 1 tablet (500 mg total) by mouth 2 (two) times daily for 5 days. 04/09/23 04/14/23  Junie Spencer, FNP  hydrOXYzine (ATARAX) 10 MG tablet Take 0.5-1 tablets (5-10 mg total) by mouth 3 (three) times daily as needed. Patient taking differently: Take 5-10 mg by mouth as needed. 09/14/22   Sheliah Hatch, MD  levonorgestrel (MIRENA) 20 MCG/24HR IUD Mirena 20 mcg/24 hours (5 yrs) 52 mg intrauterine device    [provider]  mometasone (NASONEX) 50 MCG/ACT nasal spray Nasonex    [provider]  Multiple  Vitamins-Minerals (MULTIPLE VITAMINS/WOMENS PO) Multiple Vitamin, Womens    [provider]  pantoprazole (PROTONIX) 40 MG tablet Take 1 tablet (40 mg total) by mouth daily. Take 30 minutes before breakfast. 01/06/23   Arnaldo Natal, NP  rizatriptan (MAXALT-MLT) 10 MG disintegrating tablet DISSOLVE 1 TABLET (10 MG TOTAL) BY MOUTH AS NEEDED. MAY REPEAT IN 2 HOURS IF NEEDED 09/12/19   Levert Feinstein, MD  SUMAtriptan 6 MG/0.5ML SOAJ Inject 6mg  at onset of migraine.  May repeat in 2 hrs, if needed.  Max dose: 2 injections/day. This is a 30 day prescription. 03/31/18   Levert Feinstein, MD  tirzepatide [facility]) 5 MG/0.5ML Pen Inject 5 mg into the skin once a week. 03/24/23   Elenore Paddy, NP  tirzepatide (ZEPBOUND) 5 MG/0.5ML Pen Inject 5 mg into the skin once a week. 03/24/23   Elenore Paddy, NP  traZODone (DESYREL) 50 MG tablet Take 1/2 to 1 tablet (25-50 mg total) by mouth at bedtime as needed for sleep. 07/24/21   Janeece Agee, NP      Allergies    Diphenhydramine hcl, Diphenhydramine hcl, Iodinated contrast media, Chlorhexidine gluconate, and Codeine    Review of Systems   Review of Systems  Physical Exam Updated Vital Signs BP 129/85 (BP Location: Right Arm)   Pulse 70   Temp 98.2 F (36.8 C) (Oral)   Resp 17   SpO2 99%  Physical Exam Vitals and nursing note reviewed.  Constitutional:      General: She is not in  acute distress.    Appearance: She is well-developed.  HENT:     Head: Normocephalic and atraumatic.     Right Ear: External ear normal.     Left Ear: External ear normal.     Nose: Nose normal.  Eyes:     Conjunctiva/sclera: Conjunctivae normal.  Cardiovascular:     Rate and Rhythm: Normal rate and regular rhythm.     Heart sounds: No murmur heard. Pulmonary:     Effort: No respiratory distress.     Breath sounds: No wheezing, rhonchi or rales.  Abdominal:     Palpations: Abdomen is soft.     Tenderness: There is no abdominal tenderness. There is right CVA  tenderness. There is no left CVA tenderness, guarding or rebound.  Musculoskeletal:     Cervical back: Normal range of motion and neck supple.     Right lower leg: No edema.     Left lower leg: No edema.  Skin:    General: Skin is warm and dry.     Findings: No rash.  Neurological:     General: No focal deficit present.     Mental Status: She is alert. Mental status is at baseline.     Motor: No weakness.  Psychiatric:        Mood and Affect: Mood normal.     ED Results / Procedures / Treatments   Labs (all labs ordered are listed, but only abnormal results are displayed) Labs Reviewed  BASIC METABOLIC PANEL  CBC  URINALYSIS, ROUTINE W REFLEX MICROSCOPIC  PREGNANCY, URINE    EKG None  Radiology No results found.  Procedures Procedures  {Document cardiac monitor, telemetry assessment procedure when appropriate:1}  Medications Ordered in ED Medications  ketorolac (TORADOL) 15 MG/ML injection 15 mg (has no administration in time range)  ondansetron (ZOFRAN) injection 4 mg (has no administration in time range)    ED Course/ Medical Decision Making/ A&P    Patient seen and examined. History obtained directly from patient.  Reviewed recent workup from urgent care, culture did grow out greater than 100,000 colonies of E. coli, sensitivity not yet available.  Labs/EKG: Ordered CBC, BMP, UA.  Imaging: Ordered CT renal protocol.  Noted, patient has IV contrast allergy.  Medications/Fluids: Ordered: IV Toradol, IV Zofran.   Most recent vital signs reviewed and are as follows: BP 129/85 (BP Location: Right Arm)   Pulse 70   Temp 98.2 F (36.8 C) (Oral)   Resp 17   SpO2 99%   Initial impression: UTI with question of kidney stone    {   Click here for ABCD2, HEART and other calculatorsREFRESH Note before signing :1}                          Medical Decision Making Amount and/or Complexity of Data Reviewed Labs: ordered. Radiology:  ordered.  Risk Prescription drug management.   ***  {Document critical care time when appropriate:1} {Document review of labs and clinical decision tools ie heart score, Chads2Vasc2 etc:1}  {Document your independent review of radiology images, and any outside records:1} {Document your discussion with family members, caretakers, and with consultants:1} {Document social determinants of health affecting pt's care:1} {Document your decision making why or why not admission, treatments were needed:1} Final Clinical Impression(s) / ED Diagnoses Final diagnoses:  None    Rx / DC Orders ED Discharge Orders     None

## 2023-04-09 NOTE — ED Notes (Signed)
Dc instructions reviewed with patient. Patient voiced understanding. Dc with belongings.  °

## 2023-04-09 NOTE — Progress Notes (Signed)
Virtual Visit Consent   Emma Mathews, you are scheduled for a virtual visit with a [facility] provider today. Just as with appointments in the office, your consent must be obtained to participate. Your consent will be active for this visit and any virtual visit you may have with one of our providers in the next 365 days. If you have a MyChart account, a copy of this consent can be sent to you electronically.  As this is a virtual visit, video technology does not allow for your provider to perform a traditional examination. This may limit your provider's ability to fully assess your condition. If your provider identifies any concerns that need to be evaluated in person or the need to arrange testing (such as labs, EKG, etc.), we will make arrangements to do so. Although advances in technology are sophisticated, we cannot ensure that it will always work on either your end or our end. If the connection with a video visit is poor, the visit may have to be switched to a telephone visit. With either a video or telephone visit, we are not always able to ensure that we have a secure connection.  By engaging in this virtual visit, you consent to the provision of healthcare and authorize for your insurance to be billed (if applicable) for the services provided during this visit. Depending on your insurance coverage, you may receive a charge related to this service.  I need to obtain your verbal consent now. Are you willing to proceed with your visit today? Emma Mathews has provided verbal consent on 04/09/2023 for a virtual visit (video or telephone). Jannifer Rodney, FNP  Date: 04/09/2023 9:20 AM  Virtual Visit via Video Note   I, Jannifer Rodney, connected with  Emma Mathews  ([id number], 11-24-1965) on 04/09/23 at  9:15 AM EDT by a video-enabled telemedicine application and verified that I am speaking with the correct person using two identifiers.  Location: Patient: Virtual Visit Location  Patient: Home Provider: Virtual Visit Location Provider: Home Office   I discussed the limitations of evaluation and management by telemedicine and the availability of in person appointments. The patient expressed understanding and agreed to proceed.    History of Present Illness: Emma Mathews is a 47 y.o. who identifies as a female who was assigned female at birth, and is being seen today for UTI symptoms. Reports she was seen in Urgent Care on 04/07/23 and given Macrobid for a UTI. Reports her symptoms of dysuria has improved, but has mild right flank pain with nausea. She does have a history of kidney stones.   HPI: HPI  Problems:  Patient Active Problem List   Diagnosis Date Noted   Elevated hemoglobin (HCC) 02/03/2023   Encounter for lipid screening for cardiovascular disease 01/13/2023   Diabetes mellitus screening 01/13/2023   Obesity (BMI 30-39.9) 01/13/2023   Fatigue 06/08/2022   Pain of joint of left ankle and foot 03/10/2022   Acquired equinus deformity of left foot 01/08/2020   Disorder of Eustachian tube, right 12/21/2019   Low back pain 10/26/2019   Plantar fasciitis, left 06/05/2019   Chronic migraine with aura 03/27/2018   Obesity 02/01/2018   Headache 02/01/2018   Motion sickness 02/01/2018   History of migraine 02/01/2018   Degenerative TFCC tear, right 10/25/2017   ADD 10/28/2009   ANEMIA-UNSPECIFIED 04/28/2009   ALLERGIC RHINITIS 02/25/2009    Allergies:  Allergies  Allergen Reactions   Diphenhydramine Hcl Hives and Other (See Comments)  Pt states she can take Dye free Benadryl   Diphenhydramine Hcl Other (See Comments) and Hives    Pt states she can take Dye free Benadryl   Iodinated Contrast Media Shortness Of Breath, Nausea And Vomiting, Other (See Comments) and Anaphylaxis    Shortness of breath   Chlorhexidine Gluconate Hives   Codeine Hives, Nausea And Vomiting and Rash   Medications:  Current Outpatient Medications:    ciprofloxacin  (CIPRO) 500 MG tablet, Take 1 tablet (500 mg total) by mouth 2 (two) times daily for 5 days., Disp: 10 tablet, Rfl: 0   hydrOXYzine (ATARAX) 10 MG tablet, Take 0.5-1 tablets (5-10 mg total) by mouth 3 (three) times daily as needed. (Patient taking differently: Take 5-10 mg by mouth as needed.), Disp: 30 tablet, Rfl: 0   levonorgestrel (MIRENA) 20 MCG/24HR IUD, Mirena 20 mcg/24 hours (5 yrs) 52 mg intrauterine device, Disp: , Rfl:    mometasone (NASONEX) 50 MCG/ACT nasal spray, Nasonex, Disp: , Rfl:    Multiple Vitamins-Minerals (MULTIPLE VITAMINS/WOMENS PO), Multiple Vitamin, Womens, Disp: , Rfl:    pantoprazole (PROTONIX) 40 MG tablet, Take 1 tablet (40 mg total) by mouth daily. Take 30 minutes before breakfast., Disp: 90 tablet, Rfl: 1   rizatriptan (MAXALT-MLT) 10 MG disintegrating tablet, DISSOLVE 1 TABLET (10 MG TOTAL) BY MOUTH AS NEEDED. MAY REPEAT IN 2 HOURS IF NEEDED, Disp: 15 tablet, Rfl: 0   SUMAtriptan 6 MG/0.5ML SOAJ, Inject 6mg  at onset of migraine.  May repeat in 2 hrs, if needed.  Max dose: 2 injections/day. This is a 30 day prescription., Disp: 10 Cartridge, Rfl: 5   tirzepatide (MOUNJARO) 5 MG/0.5ML Pen, Inject 5 mg into the skin once a week., Disp: 2 mL, Rfl: 1   tirzepatide (ZEPBOUND) 5 MG/0.5ML Pen, Inject 5 mg into the skin once a week., Disp: 2 mL, Rfl: 2   traZODone (DESYREL) 50 MG tablet, Take 1/2 to 1 tablet (25-50 mg total) by mouth at bedtime as needed for sleep., Disp: 30 tablet, Rfl: 3  Observations/Objective: Patient is well-developed, well-nourished in no acute distress.  Resting comfortably  at home.  Head is normocephalic, atraumatic.  No labored breathing.  Speech is clear and coherent with logical content.  Patient is alert and oriented at baseline.  No acute distress   Assessment and Plan: 1. Acute cystitis with hematuria - ciprofloxacin (CIPRO) 500 MG tablet; Take 1 tablet (500 mg total) by mouth 2 (two) times daily for 5 days.  Dispense: 10 tablet; Refill:  0  2. Flank pain  Stop Macrobid and start Cipro Force fluids Unfortunately urine culture is still pending Does have hx of kidney stones and could be stone, so if culture is negative and pain continues may need scan? She will continue to force fluids.  Follow up this week with PCP if symptoms do not improve or worsen   Follow Up Instructions: I discussed the assessment and treatment plan with the patient. The patient was provided an opportunity to ask questions and all were answered. The patient agreed with the plan and demonstrated an understanding of the instructions.  A copy of instructions were sent to the patient via MyChart unless otherwise noted below.     The patient was advised to call back or seek an in-person evaluation if the symptoms worsen or if the condition fails to improve as anticipated.  Time:  I spent 11 minutes with the patient via telehealth technology discussing the above problems/concerns.    Jannifer Rodney, FNP

## 2023-04-09 NOTE — Discharge Instructions (Signed)
Please read and follow all provided instructions.  Your diagnoses today include:  1. Right ureteral stone   2. Adnexal cyst   3. Acute cystitis without hematuria     Tests performed today include: Urine test that showed blood in your urine and no infection CT scan which showed a 7 millimeter kidney stone on the right side.  This also showed that your IUD may be embedded in the uterus and you also have a large left adnexal cyst.  These will need to be reassessed by your OB/GYN so please follow-up with them in the next 2 weeks for reevaluation. Blood test that showed normal kidney function, normal infection fighting cell count Vital signs. See below for your results today.   Medications prescribed:  Oxycodone - narcotic pain medication  DO NOT drive or perform any activities that require you to be awake and alert because this medicine can make you drowsy.   Zofran (ondansetron) - for nausea and vomiting  Flomax (tamsulosin) - relaxes smooth muscle to help kidney stones pass  Cefdinir: medication for UTI  Take any prescribed medications only as directed.  Home care instructions:  Follow any educational materials contained in this packet.  Please double your fluid intake for the next several days. Strain your urine and save any stones that may pass.   BE VERY CAREFUL not to take multiple medicines containing Tylenol (also called acetaminophen). Doing so can lead to an overdose which can damage your liver and cause liver failure and possibly death.   Follow-up instructions: Please follow-up with your urologist or the urologist referral (provided on front page) in the next 1 week for further evaluation of your symptoms.  Return instructions:  If you need to return to the Emergency Department, go to [facility] and not [facility] The urologists are located at [facility] and can better care for you at this location.  Please return to the Emergency Department if you  experience worsening symptoms.  Please return if you develop fever or uncontrolled pain or vomiting. Please return if you have any other emergent concerns.  Additional Information:  Your vital signs today were: BP 109/66   Pulse (!) 57   Temp 98.2 F (36.8 C) (Oral)   Resp 20   SpO2 99%  If your blood pressure (BP) was elevated above 135/85 this visit, please have this repeated by your doctor within one month. --------------

## 2023-04-10 LAB — URINE CULTURE: Culture: >=100000 — AB

## 2023-04-12 ENCOUNTER — Other Ambulatory Visit: Payer: Self-pay | Admitting: Urology

## 2023-04-12 ENCOUNTER — Other Ambulatory Visit (HOSPITAL_COMMUNITY): Payer: Self-pay

## 2023-04-12 ENCOUNTER — Telehealth: Payer: Self-pay

## 2023-04-12 DIAGNOSIS — N201 Calculus of ureter: Secondary | ICD-10-CM | POA: Diagnosis not present

## 2023-04-12 MED ORDER — CEFDINIR 300 MG PO CAPS
300.0000 mg | ORAL_CAPSULE | Freq: Two times a day (BID) | ORAL | 0 refills | Status: DC
  Filled 2023-04-12: qty 14, 7d supply, fill #0

## 2023-04-12 NOTE — Transitions of Care (Post Inpatient/ED Visit) (Signed)
04/12/2023  Name: Emma Mathews MRN: [id number] DOB: [birth year]  Today's TOC FU Call Status: Today's TOC FU Call Status:: Successful TOC FU Call Competed TOC FU Call Complete Date: 04/12/23  Transition Care Management Follow-up Telephone Call Date of Discharge: 04/09/23 Discharge Facility: [facility] ([facility]) Type of Discharge: Emergency Department Reason for ED Visit: Other: (calculus of ureter) How have you been since you were released from the hospital?: Same Any questions or concerns?: No  Items Reviewed: Did you receive and understand the discharge instructions provided?: Yes Medications obtained,verified, and reconciled?: Yes (Medications Reviewed) Any new allergies since your discharge?: No Dietary orders reviewed?: Yes Do you have support at home?: Yes People in Home: spouse  Medications Reviewed Today: Medications Reviewed Today     Reviewed by Karena Addison, LPN (Licensed Practical Nurse) on 04/12/23 at 1159  Med List Status: <None>   Medication Order Taking? Sig Documenting Provider Last Dose Status Informant  cefdinir (OMNICEF) 300 MG capsule [id number] Yes Take 1 capsule (300 mg total) by mouth 2 (two) times daily. Renne Crigler, PA-C Taking Active   cefdinir (OMNICEF) 300 MG capsule [id number] Yes Take 1 capsule (300 mg total) by mouth 2 (two) times daily.  Taking Active   hydrOXYzine (ATARAX) 10 MG tablet [id number]  Take 0.5-1 tablets (5-10 mg total) by mouth 3 (three) times daily as needed.  Patient taking differently: Take 5-10 mg by mouth as needed.   Sheliah Hatch, MD  Active   levonorgestrel (MIRENA) 20 MCG/24HR IUD [id number] Yes Mirena 20 mcg/24 hours (5 yrs) 52 mg intrauterine device [provider] Taking Active   mometasone (NASONEX) 50 MCG/ACT nasal spray [id number] Yes Nasonex [provider] Taking Active   Multiple Vitamins-Minerals (MULTIPLE VITAMINS/WOMENS PO) [id number] Yes Multiple Vitamin, Womens [provider] Taking Active   ondansetron (ZOFRAN-ODT) 4 MG disintegrating tablet [id number] Yes Take 1 tablet (4 mg total) by mouth every 8 (eight) hours as needed for nausea or vomiting. Renne Crigler, PA-C Taking Active   oxyCODONE-acetaminophen (PERCOCET/ROXICET) 5-325 MG tablet [id number] Yes Take 1 tablet by mouth every 6 (six) hours as needed for severe pain. Renne Crigler, PA-C Taking Active   pantoprazole (PROTONIX) 40 MG tablet [id number] Yes Take 1 tablet (40 mg total) by mouth daily. Take 30 minutes before breakfast. Arnaldo Natal, NP Taking Active   rizatriptan (MAXALT-MLT) 10 MG disintegrating tablet [id number] Yes DISSOLVE 1 TABLET (10 MG TOTAL) BY MOUTH AS NEEDED. MAY REPEAT IN 2 HOURS IF NEEDED Levert Feinstein, MD Taking Active            Med Note (CROWDER, DIAMOND   Tue Jun 08, 2022 12:42 PM) PRN  SUMAtriptan 6 MG/0.5ML Ivory Broad [id number] Yes Inject 6mg  at onset of migraine.  May repeat in 2 hrs, if needed.  Max dose: 2 injections/day. This is a 30 day prescription. Levert Feinstein, MD Taking Active   tamsulosin [facility]) 0.4 MG CAPS capsule [id number] Yes Take 1 capsule (0.4 mg total) by mouth daily. Renne Crigler, PA-C Taking Active   tirzepatide [facility]) 5 MG/0.5ML Pen [id number] Yes Inject 5 mg into the skin once a week. Elenore Paddy, NP Taking Active   tirzepatide [facility]) 5 MG/0.5ML Pen [id number] Yes Inject 5 mg into the skin once a week. Elenore Paddy, NP Taking Active   traZODone (DESYREL) 50 MG tablet [id number]  Take 1/2 to 1 tablet (25-50 mg total) by mouth at bedtime as needed for sleep. Janeece Agee, NP  Active  Home Care and Equipment/Supplies: Were Home Health Services Ordered?: NA Any new equipment or medical supplies ordered?: NA  Functional Questionnaire: Do you need assistance with bathing/showering or dressing?: No Do you need assistance with meal preparation?: No Do you need assistance with eating?: No Do you have difficulty maintaining  continence: No Do you need assistance with getting out of bed/getting out of a chair/moving?: No Do you have difficulty managing or taking your medications?: No  Follow up appointments reviewed: PCP Follow-up appointment confirmed?: NA Specialist Hospital Follow-up appointment confirmed?: Yes Date of Specialist follow-up appointment?: 04/12/23 Follow-Up Specialty Provider:: uro Do you need transportation to your follow-up appointment?: No Do you understand care options if your condition(s) worsen?: Yes-patient verbalized understanding    SIGNATURE Karena Addison, LPN [facility] Nurse Health Advisor Direct Dial [phone]

## 2023-04-13 ENCOUNTER — Encounter (HOSPITAL_BASED_OUTPATIENT_CLINIC_OR_DEPARTMENT_OTHER): Payer: Self-pay | Admitting: Urology

## 2023-04-13 NOTE — Progress Notes (Signed)
Spoke with pt regarding upcoming ESWL 04/18/23. Instructed on arrival time of 0645, reviewed location. NPO after midnight, except sip of water with morning meds. Reviewed allergies, medications, and history. Pt aware to hold mounjaro until after procedure, last dose 04/07/23. Reviewed holding vitamins, ASA/NSAID products and [facility] pre-op instructions. Spouse to be driver. Pt verbalized understanding of instructions.

## 2023-04-14 ENCOUNTER — Other Ambulatory Visit (HOSPITAL_COMMUNITY): Payer: Self-pay

## 2023-04-18 ENCOUNTER — Encounter (HOSPITAL_BASED_OUTPATIENT_CLINIC_OR_DEPARTMENT_OTHER): Payer: Self-pay | Admitting: Urology

## 2023-04-18 ENCOUNTER — Encounter (HOSPITAL_BASED_OUTPATIENT_CLINIC_OR_DEPARTMENT_OTHER)
Admission: RE | Disposition: A | Discharge status: Home / Self Care | Payer: Self-pay | Source: Home / Self Care | Attending: Urology

## 2023-04-18 ENCOUNTER — Ambulatory Visit (HOSPITAL_BASED_OUTPATIENT_CLINIC_OR_DEPARTMENT_OTHER)
Admission: RE | Admit: 2023-04-18 | Discharge: 2023-04-18 | Disposition: A | Discharge status: Home / Self Care | Payer: 59 | Attending: Urology | Admitting: Urology

## 2023-04-18 ENCOUNTER — Ambulatory Visit (HOSPITAL_COMMUNITY): Payer: 59

## 2023-04-18 ENCOUNTER — Other Ambulatory Visit (HOSPITAL_COMMUNITY): Payer: Self-pay

## 2023-04-18 DIAGNOSIS — E669 Obesity, unspecified: Secondary | ICD-10-CM | POA: Insufficient documentation

## 2023-04-18 DIAGNOSIS — N201 Calculus of ureter: Secondary | ICD-10-CM | POA: Insufficient documentation

## 2023-04-18 DIAGNOSIS — Z79899 Other long term (current) drug therapy: Secondary | ICD-10-CM | POA: Diagnosis not present

## 2023-04-18 DIAGNOSIS — Z6833 Body mass index (BMI) 33.0-33.9, adult: Secondary | ICD-10-CM | POA: Insufficient documentation

## 2023-04-18 DIAGNOSIS — Z01818 Encounter for other preprocedural examination: Secondary | ICD-10-CM

## 2023-04-18 HISTORY — PX: EXTRACORPOREAL SHOCK WAVE LITHOTRIPSY: SHX1557

## 2023-04-18 LAB — POCT PREGNANCY, URINE: Preg Test, Ur: NEGATIVE

## 2023-04-18 SURGERY — LITHOTRIPSY, ESWL
Anesthesia: LOCAL | Laterality: Right

## 2023-04-18 MED ORDER — DIPHENHYDRAMINE HCL 25 MG PO CAPS
25.0000 mg | ORAL_CAPSULE | ORAL | Status: AC
  Administered 2023-04-18: 25 mg via ORAL
  Filled 2023-04-18: qty 1

## 2023-04-18 MED ORDER — DOCUSATE SODIUM 100 MG PO CAPS
100.0000 mg | ORAL_CAPSULE | Freq: Every day | ORAL | 0 refills | Status: DC | PRN
  Filled 2023-04-18: qty 30, 30d supply, fill #0

## 2023-04-18 MED ORDER — DIPHENHYDRAMINE HCL 25 MG PO CAPS: 25.0000 mg | ORAL_CAPSULE | ORAL | Status: DC

## 2023-04-18 MED ORDER — DIAZEPAM 5 MG PO TABS
ORAL_TABLET | ORAL | Status: AC
  Filled 2023-04-18: qty 2

## 2023-04-18 MED ORDER — OXYCODONE-ACETAMINOPHEN 5-325 MG PO TABS
1.0000 | ORAL_TABLET | ORAL | 0 refills | Status: DC | PRN
  Filled 2023-04-18: qty 18, 3d supply, fill #0

## 2023-04-18 MED ORDER — DIAZEPAM 5 MG PO TABS
10.0000 mg | ORAL_TABLET | ORAL | Status: AC
  Administered 2023-04-18: 10 mg via ORAL

## 2023-04-18 MED ORDER — SODIUM CHLORIDE 0.9 % IV SOLN: INTRAVENOUS | Status: DC

## 2023-04-18 MED ORDER — CIPROFLOXACIN HCL 500 MG PO TABS
ORAL_TABLET | ORAL | Status: AC
  Filled 2023-04-18: qty 1

## 2023-04-18 MED ORDER — CIPROFLOXACIN HCL 500 MG PO TABS
500.0000 mg | ORAL_TABLET | Freq: Once | ORAL | Status: AC
  Administered 2023-04-18: 500 mg via ORAL

## 2023-04-18 NOTE — Op Note (Signed)
[facility] Operative Note  Treating Physician: Keith Cancio, MD  Pre-op diagnosis: Right proximal ureteral stone  Post-op diagnosis: Same   Procedure: Right ESWL  See [facility] OP note scanned into chart. Also because of the size, density, location and other factors that cannot be anticipated I feel this will likely be a staged procedure. This fact supersedes any indication in the scanned [facility] operative note to the contrary.  Matt R. Denay Pleitez MD [facility]  Pager: [phone]   

## 2023-04-18 NOTE — H&P (Signed)
Urology Preoperative H&P   Chief Complaint: Right proximal ureteral stone   History of Present Illness: Emma Mathews is a 47 y.o. female with right proximal ureteral stone here for right ESWL. Denies fevers, chills, dysuria.    Past Medical History:  Diagnosis Date   Anemia    Anxiety    Appendicitis, acute 10/02/2013   Cancer (HCC)    melanoma excision in 2013   Cholecystitis    Complication of anesthesia    slow to wake up   Depression    Encounter for lipid screening for cardiovascular disease 01/13/2023   Headache    migraine   Mammographic breast lesion 06/28/2017   S/p biopsy, benign    Pneumonia 2010   PONV (postoperative nausea and vomiting)    Seasonal allergies    STRESS FRACTURE, FOOT 04/08/2009   Qualifier: Diagnosis of  By: Lovell Sheehan MD, Balinda Quails    Vision abnormalities    s/p Lasik    Past Surgical History:  Procedure Laterality Date   CHOLECYSTECTOMY     EYE SURGERY Bilateral    PRK   Labrum Repair Right 11/15/1998   Open   LAPAROSCOPIC APPENDECTOMY N/A 09/02/2013   Procedure: APPENDECTOMY LAPAROSCOPIC;  Surgeon: Shelly Rubenstein, MD;  Location: [facility] OR;  Service: General;  Laterality: N/A;   SHOULDER ARTHROSCOPY Left 11/16/2003   bone spurs    SHOULDER ARTHROSCOPY Right 11/01/2017   Procedure: RIGHT SHOULDER ARTHROSCOPY WITH SUBACROMIAL DECOMPRESSION;  Surgeon: Cammy Copa, MD;  Location: [facility] OR;  Service: Orthopedics;  Laterality: Right;   SHOULDER ARTHROSCOPY WITH SUBACROMIAL DECOMPRESSION, ROTATOR CUFF REPAIR AND BICEP TENDON REPAIR Right 02/24/2016   Procedure: RIGHT SHOULDER ARTHROSCOPY WITH DEBRIDEMENT, BICEPS TENODESIS;  Surgeon: Cammy Copa, MD;  Location: [facility] OR;  Service: Orthopedics;  Laterality: Right;   SHOULDER SURGERY Right 11/15/2005   Revision and repair flap   WISDOM TOOTH EXTRACTION     WRIST ARTHROSCOPY Right 11/09/2017   Procedure: ARTHROSCOPY WRIST WITH DEBRIDEMENT;  Surgeon: Dairl Ponder, MD;  Location:  [facility];  Service: Orthopedics;  Laterality: Right;    Allergies:  Allergies  Allergen Reactions   Diphenhydramine Hcl Hives and Other (See Comments)    Pt states she can take Dye free Benadryl   Diphenhydramine Hcl Other (See Comments) and Hives    Pt states she can take Dye free Benadryl   Iodinated Contrast Media Shortness Of Breath, Nausea And Vomiting, Other (See Comments) and Anaphylaxis    Shortness of breath   Chlorhexidine Gluconate Hives   Codeine Hives, Nausea And Vomiting and Rash    Family History  Adopted: Yes  Family history unknown: Yes    Social History:  reports that she has never smoked. She has never used smokeless tobacco. She reports current alcohol use. She reports that she does not use drugs.  ROS: A complete review of systems was performed.  All systems are negative except for pertinent findings as noted.  Physical Exam:  Vital signs in last 24 hours: Temp:  [97.7 F (36.5 C)] 97.7 F (36.5 C) (06/03 0726) Pulse Rate:  [85] 85 (06/03 0726) Resp:  [17] 17 (06/03 0726) BP: (111)/(62) 111/62 (06/03 0726) SpO2:  [98 %] 98 % (06/03 0726) Weight:  [95.2 kg] 95.2 kg (06/03 0726) Constitutional:  Alert and oriented, No acute distress Cardiovascular: Regular rate and rhythm Respiratory: Normal respiratory effort, Lungs clear bilaterally GI: Abdomen is soft, nontender, nondistended, no abdominal masses GU: No CVA tenderness Lymphatic: No lymphadenopathy Neurologic:  Grossly intact, no focal deficits Psychiatric: Normal mood and affect  Laboratory Data:  No results for input(s): "WBC", "HGB", "HCT", "PLT" in the last 72 hours.  No results for input(s): "NA", "K", "CL", "GLUCOSE", "BUN", "CALCIUM", "CREATININE" in the last 72 hours.  Invalid input(s): "CO3"   Results for orders placed or performed during the hospital encounter of 04/18/23 (from the past 24 hour(s))  Pregnancy, urine POC     Status: None   Collection Time: 04/18/23   6:55 AM  Result Value Ref Range   Preg Test, Ur NEGATIVE NEGATIVE   No results found for this or any previous visit (from the past 240 hour(s)).  Renal Function: No results for input(s): "CREATININE" in the last 168 hours. Estimated Creatinine Clearance: 103.3 mL/min (by C-G formula based on SCr of 0.72 mg/dL).  Radiologic Imaging: No results found.  I independently reviewed the above imaging studies.  Assessment and Plan Emma Mathews is a 47 y.o. female with right proximal ureteral stone here for right ESWL.  The risks, benefits and alternatives of right ESWL was discussed with the patient. I described the risks which include arrhythmia, kidney contusion, kidney hemorrhage, need for transfusion, back discomfort, flank ecchymosis, flank abrasion, inability to fracture the stone, inability to pass stone fragments, Steinstrasse, infection associated with obstructing stones, need for an alternative surgical procedure and possible need for repeat shockwave lithotripsy.  The patient voices understanding and wishes to proceed.      Matt R. Patricie Geeslin MD 04/18/2023, 8:53 AM  [facility] Pager: [phone]): [phone]

## 2023-04-18 NOTE — Discharge Instructions (Signed)

## 2023-04-19 ENCOUNTER — Other Ambulatory Visit (HOSPITAL_COMMUNITY): Payer: Self-pay

## 2023-04-19 ENCOUNTER — Encounter (HOSPITAL_BASED_OUTPATIENT_CLINIC_OR_DEPARTMENT_OTHER): Payer: Self-pay | Admitting: Urology

## 2023-04-20 ENCOUNTER — Other Ambulatory Visit (HOSPITAL_COMMUNITY): Payer: Self-pay

## 2023-04-20 ENCOUNTER — Other Ambulatory Visit: Payer: Self-pay

## 2023-04-20 ENCOUNTER — Emergency Department (HOSPITAL_COMMUNITY)
Admission: EM | Admit: 2023-04-20 | Discharge: 2023-04-20 | Disposition: A | Discharge status: Home / Self Care | Payer: 59 | Attending: Emergency Medicine | Admitting: Emergency Medicine

## 2023-04-20 ENCOUNTER — Emergency Department (HOSPITAL_COMMUNITY): Payer: 59

## 2023-04-20 ENCOUNTER — Encounter (HOSPITAL_COMMUNITY): Payer: Self-pay | Admitting: Radiology

## 2023-04-20 DIAGNOSIS — N201 Calculus of ureter: Secondary | ICD-10-CM | POA: Diagnosis not present

## 2023-04-20 DIAGNOSIS — G8918 Other acute postprocedural pain: Secondary | ICD-10-CM | POA: Diagnosis not present

## 2023-04-20 DIAGNOSIS — R109 Unspecified abdominal pain: Secondary | ICD-10-CM | POA: Diagnosis present

## 2023-04-20 DIAGNOSIS — N200 Calculus of kidney: Secondary | ICD-10-CM | POA: Insufficient documentation

## 2023-04-20 LAB — CBC WITH DIFFERENTIAL/PLATELET
Abs Immature Granulocytes: 0.03 10*3/uL (ref 0.00–0.07)
Basophils Absolute: 0 10*3/uL (ref 0.0–0.1)
Basophils Relative: 1 %
Eosinophils Absolute: 0.4 10*3/uL (ref 0.0–0.5)
Eosinophils Relative: 5 %
HCT: 45.5 % (ref 36.0–46.0)
Hemoglobin: 15.6 g/dL — ABNORMAL HIGH (ref 12.0–15.0)
Immature Granulocytes: 0 %
Lymphocytes Relative: 18 %
Lymphs Abs: 1.3 10*3/uL (ref 0.7–4.0)
MCH: 29.3 pg (ref 26.0–34.0)
MCHC: 34.3 g/dL (ref 30.0–36.0)
MCV: 85.5 fL (ref 80.0–100.0)
Monocytes Absolute: 0.5 10*3/uL (ref 0.1–1.0)
Monocytes Relative: 7 %
Neutro Abs: 5.3 10*3/uL (ref 1.7–7.7)
Neutrophils Relative %: 69 %
Platelets: 236 10*3/uL (ref 150–400)
RBC: 5.32 MIL/uL — ABNORMAL HIGH (ref 3.87–5.11)
RDW: 13.8 % (ref 11.5–15.5)
WBC: 7.6 10*3/uL (ref 4.0–10.5)
nRBC: 0 % (ref 0.0–0.2)

## 2023-04-20 LAB — COMPREHENSIVE METABOLIC PANEL
ALT: 19 U/L (ref 0–44)
AST: 20 U/L (ref 15–41)
Albumin: 4.2 g/dL (ref 3.5–5.0)
Alkaline Phosphatase: 64 U/L (ref 38–126)
Anion gap: 9 (ref 5–15)
BUN: 12 mg/dL (ref 6–20)
CO2: 24 mmol/L (ref 22–32)
Calcium: 9.5 mg/dL (ref 8.9–10.3)
Chloride: 106 mmol/L (ref 98–111)
Creatinine, Ser: 0.9 mg/dL (ref 0.44–1.00)
GFR, Estimated: >60 mL/min (ref >60)
Glucose, Bld: 96 mg/dL (ref 70–99)
Potassium: 3.7 mmol/L (ref 3.5–5.1)
Sodium: 139 mmol/L (ref 135–145)
Total Bilirubin: 0.7 mg/dL (ref 0.3–1.2)
Total Protein: 8.4 g/dL — ABNORMAL HIGH (ref 6.5–8.1)

## 2023-04-20 LAB — URINALYSIS, ROUTINE W REFLEX MICROSCOPIC
Bacteria, UA: NONE SEEN
Bilirubin Urine: NEGATIVE
Glucose, UA: NEGATIVE mg/dL
Ketones, ur: NEGATIVE mg/dL
Nitrite: NEGATIVE
Protein, ur: NEGATIVE mg/dL
RBC / HPF: >50 RBC/hpf (ref 0–5)
Specific Gravity, Urine: 1.02 (ref 1.005–1.030)
pH: 6 (ref 5.0–8.0)

## 2023-04-20 LAB — I-STAT BETA HCG BLOOD, ED (MC, WL, AP ONLY): I-stat hCG, quantitative: <5 m[IU]/mL (ref <5)

## 2023-04-20 MED ORDER — HYDROCODONE-ACETAMINOPHEN 5-325 MG PO TABS: 2.0000 | ORAL_TABLET | Freq: Four times a day (QID) | ORAL | 0 refills | Status: DC | PRN

## 2023-04-20 MED ORDER — ONDANSETRON HCL 4 MG/2ML IJ SOLN
4.0000 mg | Freq: Once | INTRAMUSCULAR | Status: AC
  Administered 2023-04-20: 4 mg via INTRAVENOUS
  Filled 2023-04-20: qty 2

## 2023-04-20 MED ORDER — PHENAZOPYRIDINE HCL 200 MG PO TABS
200.0000 mg | ORAL_TABLET | Freq: Once | ORAL | Status: AC
  Administered 2023-04-20: 200 mg via ORAL
  Filled 2023-04-20: qty 1

## 2023-04-20 MED ORDER — HYDROCODONE-ACETAMINOPHEN 5-325 MG PO TABS
1.0000 | ORAL_TABLET | Freq: Four times a day (QID) | ORAL | 0 refills | Status: DC | PRN
  Filled 2023-04-20: qty 10, 3d supply, fill #0

## 2023-04-20 MED ORDER — KETOROLAC TROMETHAMINE 30 MG/ML IJ SOLN
30.0000 mg | Freq: Once | INTRAMUSCULAR | Status: AC
  Administered 2023-04-20: 30 mg via INTRAVENOUS
  Filled 2023-04-20: qty 1

## 2023-04-20 MED ORDER — TAMSULOSIN HCL 0.4 MG PO CAPS
0.4000 mg | ORAL_CAPSULE | Freq: Every day | ORAL | 0 refills | Status: AC
  Filled 2023-04-20: qty 14, 14d supply, fill #0

## 2023-04-20 MED ORDER — PHENAZOPYRIDINE HCL 200 MG PO TABS
200.0000 mg | ORAL_TABLET | Freq: Three times a day (TID) | ORAL | 0 refills | Status: AC | PRN
  Filled 2023-04-20: qty 9, 3d supply, fill #0

## 2023-04-20 NOTE — ED Notes (Addendum)
Bladder scan 156mL

## 2023-04-20 NOTE — ED Provider Notes (Signed)
[place] EMERGENCY DEPARTMENT AT [facility] Provider Note   CSN: [id number] Arrival date & time: 04/20/23  0865     History  Chief Complaint  Patient presents with   Post-op Problem    KITTY IMMEKUS is a 47 y.o. female 2 days post ESWL who presents to the ED today for right flank pain. Patient reports she was feeling some discomfort after the procedure but was able to void and saw that she was passing some stone fragments. The stone was 7 mm in size initially. She says that yesterday she was unable to void despite experiencing urgency, burning, and throbbing pain with associated nausea.  Her pain woke her up at 3 AM this morning and she feels as if the stone is still there. The pain starts at her right flank and radiates to her abdomen. Patient has been taking Tylenol for pain with the last dose at 3:30 AM this morning. She was given Oxycodone for surgery but she has not taken that in the past two days due to nausea despite taking Zofran.    Home Medications Prior to Admission medications   Medication Sig Start Date End Date Taking? Authorizing Provider  HYDROcodone-acetaminophen (NORCO/VICODIN) 5-325 MG tablet Take 1 tablet by mouth every 6 (six) hours as needed for severe pain. 04/20/23  Yes Renne Crigler, PA-C  phenazopyridine (PYRIDIUM) 200 MG tablet Take 1 tablet (200 mg total) by mouth 3 (three) times daily as needed for up to 3 days for pain. 04/20/23 04/23/23 Yes Maxwell Marion, PA-C  tamsulosin (FLOMAX) 0.4 MG CAPS capsule Take 1 capsule (0.4 mg total) by mouth daily for 14 days. 04/20/23 05/04/23 Yes Maxwell Marion, PA-C  docusate sodium (COLACE) 100 MG capsule Take 1 capsule (100 mg total) by mouth daily as needed for up to 30 doses. 04/18/23   Jannifer Hick, MD  hydrOXYzine (ATARAX) 10 MG tablet Take 0.5-1 tablets (5-10 mg total) by mouth 3 (three) times daily as needed. Patient taking differently: Take 5-10 mg by mouth as needed. 09/14/22   Sheliah Hatch, MD   levonorgestrel (MIRENA) 20 MCG/24HR IUD Mirena 20 mcg/24 hours (5 yrs) 52 mg intrauterine device    [provider]  mometasone (NASONEX) 50 MCG/ACT nasal spray Nasonex    [provider]  Multiple Vitamins-Minerals (MULTIPLE VITAMINS/WOMENS PO) Multiple Vitamin, Womens    [provider]  ondansetron (ZOFRAN-ODT) 4 MG disintegrating tablet Take 1 tablet (4 mg total) by mouth every 8 (eight) hours as needed for nausea or vomiting. 04/09/23   Renne Crigler, PA-C  pantoprazole (PROTONIX) 40 MG tablet Take 1 tablet (40 mg total) by mouth daily. Take 30 minutes before breakfast. 01/06/23   Arnaldo Natal, NP  rizatriptan (MAXALT-MLT) 10 MG disintegrating tablet DISSOLVE 1 TABLET (10 MG TOTAL) BY MOUTH AS NEEDED. MAY REPEAT IN 2 HOURS IF NEEDED 09/12/19   Levert Feinstein, MD  SUMAtriptan 6 MG/0.5ML SOAJ Inject 6mg  at onset of migraine.  May repeat in 2 hrs, if needed.  Max dose: 2 injections/day. This is a 30 day prescription. 03/31/18   Levert Feinstein, MD  tirzepatide [facility]) 5 MG/0.5ML Pen Inject 5 mg into the skin once a week. 03/24/23   Elenore Paddy, NP      Allergies    Diphenhydramine hcl, Diphenhydramine hcl, Iodinated contrast media, Chlorhexidine gluconate, and Codeine    Review of Systems   Review of Systems  Genitourinary:  Positive for flank pain.       Right flank  pain    Physical Exam Updated Vital Signs BP 103/76   Pulse 100   Temp 97.9 F (36.6 C) (Oral)   Resp 16  Physical Exam Vitals and nursing note reviewed.  Constitutional:      General: She is in acute distress.     Appearance: Normal appearance.  HENT:     Head: Normocephalic and atraumatic.     Mouth/Throat:     Mouth: Mucous membranes are moist.  Eyes:     Conjunctiva/sclera: Conjunctivae normal.     Pupils: Pupils are equal, round, and reactive to light.  Cardiovascular:     Rate and Rhythm: Normal rate and regular rhythm.     Pulses: Normal pulses.     Heart sounds: Normal  heart sounds.  Pulmonary:     Effort: Pulmonary effort is normal.     Breath sounds: Normal breath sounds.  Abdominal:     Palpations: Abdomen is soft.     Tenderness: There is abdominal tenderness. There is right CVA tenderness.     Comments: Right sided abdominal tenderness  Skin:    General: Skin is warm and dry.     Findings: Erythema present.     Comments: Erythema to right flank  Neurological:     General: No focal deficit present.     Mental Status: She is alert.  Psychiatric:        Mood and Affect: Mood normal.        Behavior: Behavior normal.     ED Results / Procedures / Treatments   Labs (all labs ordered are listed, but only abnormal results are displayed) Labs Reviewed  URINALYSIS, ROUTINE W REFLEX MICROSCOPIC - Abnormal; Notable for the following components:      Result Value   Hgb urine dipstick LARGE (*)    Leukocytes,Ua TRACE (*)    All other components within normal limits  CBC WITH DIFFERENTIAL/PLATELET - Abnormal; Notable for the following components:   RBC 5.32 (*)    Hemoglobin 15.6 (*)    All other components within normal limits  COMPREHENSIVE METABOLIC PANEL - Abnormal; Notable for the following components:   Total Protein 8.4 (*)    All other components within normal limits  I-STAT BETA HCG BLOOD, ED ([facility], [facility], [facility] ONLY)    EKG None  Radiology CT Renal Stone Study  Result Date: 04/20/2023 CLINICAL DATA:  Right flank pain.  Status post ESWL EXAM: CT ABDOMEN AND PELVIS WITHOUT CONTRAST TECHNIQUE: Multidetector CT imaging of the abdomen and pelvis was performed following the standard protocol without IV contrast. RADIATION DOSE REDUCTION: This exam was performed according to the departmental dose-optimization program which includes automated exposure control, adjustment of the mA and/or kV according to patient size and/or use of iterative reconstruction technique. COMPARISON:  CT 04/09/2023 FINDINGS: Lower chest: No acute abnormality. Lack of IV  contrast can limit evaluation of abdominal and pelvic organ pathology. Hepatobiliary: No focal liver abnormality is seen. Status post cholecystectomy. No biliary dilatation. Pancreas: Unremarkable. No pancreatic ductal dilatation or surrounding inflammatory changes. Spleen: Normal in size without focal abnormality. Adrenals/Urinary Tract: Adrenal glands are preserved. No left-sided renal or ureteral stone. Urinary bladder is contracted. There is persistent mild right-sided renal collecting system. No intrarenal stones. The dilatation extends down to level of the distal ureter where there are several small stones identified as seen on coronal series 6, image 59 measuring up to 3 mm. The previous larger proximal stone is no longer seen in this would be consistent  with a ESWL. Stomach/Bowel: On this non oral contrast exam, large bowel has a normal course and caliber with mild-to-moderate colonic stool. Surgical changes along the base of the cecum. Stomach and small bowel are nondilated. Vascular/Lymphatic: No significant vascular findings are present. No enlarged abdominal or pelvic lymph nodes. Reproductive: IUD in the uterus. Of note the time of the IUD is somewhat peripheral in the uterine parenchyma as described previously, within 1-2 mm of the outer margin. Once again there is a large cystic structure in the left adnexa measuring up to 8.2 cm. Smaller focus on the right measuring 2.8 cm. These could be ovarian. Other: No free air or free fluid. Musculoskeletal: Mild degenerative changes seen of the spine and pelvis. Multilevel disc bulging. IMPRESSION: Previous large right UPJ stone is no longer seen but there is a cluster of several small 3 mm stones along the more distal right ureter with persistent right-sided renal collecting system dilatation. As described previously there is a IUD in the uterus but is somewhat peripheral in the myometrium of the fundus of the uterus within 2 mm of the surface of the uterus.  Penetration into the myometrium is possible. Recommend gynecologic evaluation when clinically appropriate. In addition there are bilateral presumed ovarian cystic lesions measuring up to 8 cm. Again as previously recommended recommend further evaluation. Follow-up by [place] is recommended in 3-6 months. Note: This recommendation does not apply to premenarchal patients and to those with increased risk (genetic, family history, elevated tumor markers or other high-risk factors) of ovarian cancer. Reference: JACR 2020 Feb; 17(2):248-254 Electronically Signed   By: Karen Kays M.D.   On: 04/20/2023 10:13    Procedures Procedures: not indicated.   Medications Ordered in ED Medications  ketorolac (TORADOL) 30 MG/ML injection 30 mg (30 mg Intravenous Given 04/20/23 0756)  ondansetron (ZOFRAN) injection 4 mg (4 mg Intravenous Given 04/20/23 0755)  phenazopyridine (PYRIDIUM) tablet 200 mg (200 mg Oral Given 04/20/23 1610)    ED Course/ Medical Decision Making/ A&P                             Medical Decision Making Amount and/or Complexity of Data Reviewed Labs: ordered.  Risk Prescription drug management.   This patient presents to the ED for concern of right flank pain 2 days s/p ESWL and inability to empty bladder, this involves an extensive number of treatment options, and is a complaint that carries with it a high risk of complications and morbidity.   Differential diagnosis includes: pain second to ESWL, stone obstruction, pyelonephritis, UTI, muscle strain.   Co morbidities that complicate the patient evaluation  History of previous kidney stone Anemia Migraines Previous abdominal surgeries   Additional history obtained:  Additional history obtained from previous ED notes and Dr. Eugenia Mcalpine surgery notes.   Lab Tests:  I ordered and personally interpreted labs.  The pertinent results include:   CMP within normal limits. Pregnancy test negative. CBC within normal limits. U/A  within normal limits. Blood present most likely secondary to lithotripsy. Prior UTI has resolved.   Imaging Studies ordered:  I ordered imaging studies including: bladder U/S and CT renal stone protocol  Bladder scan showed: 156 mL urine I independently visualized and interpreted CT imaging which showed: a cluster of 3 mm stones in the distal right ureter with persistent right sided renal collecting system dilation. I agree with the radiologist interpretation   Problem List / ED Course /  Critical interventions / Medication management  Right flank pain 2 days s/p ESWL, flank pain that radiates to the abdomen, inability to void I ordered medications including: Toradol  for pain Zofran for nausea  Pyridium for bladder discomfort Reevaluation of the patient after these medicines showed that the patient improved I have reviewed the patients home medicines and have made adjustments as needed.   Social Determinants of Health:  Social Surveyor, minerals / Admission - Considered:  Patient feels better after Toradol and Pyridium in the ED Discussed results with patient. She feels comfortable being discharged home. Since she could not tolerate Oxycodone, Vicodin prescribed for pain relief as well as Pyridium and Flomax, since patient is out of that.   Patient has follow up with urologist in 2 weeks. Instructed her to update them about being in the ED today. Advised patient to follow up with gynecologist for incidental findings on imaging. Hemodynamically stable and safe for discharge home. Return instructions provided.        Final Clinical Impression(s) / ED Diagnoses Final diagnoses:  Nephrolithiasis  Post-operative pain    Rx / DC Orders ED Discharge Orders          Ordered    tamsulosin (FLOMAX) 0.4 MG CAPS capsule  Daily        04/20/23 1110    phenazopyridine (PYRIDIUM) 200 MG tablet  3 times daily PRN        04/20/23 1110    HYDROcodone-acetaminophen  (NORCO/VICODIN) 5-325 MG tablet  Every 6 hours PRN,   Status:  Discontinued        04/20/23 1110    HYDROcodone-acetaminophen (NORCO/VICODIN) 5-325 MG tablet  Every 6 hours PRN,   Status:  Discontinued        04/20/23 1111    HYDROcodone-acetaminophen (NORCO/VICODIN) 5-325 MG tablet  Every 6 hours PRN        04/20/23 1113              Maxwell Marion, PA-C 04/20/23 1122    Bethann Berkshire, MD 04/21/23 1117

## 2023-04-20 NOTE — ED Notes (Signed)
Pt ambulated without incident or assistance to the bathroom for urine sample.

## 2023-04-20 NOTE — Discharge Instructions (Addendum)
You have several 3 mm stones in your right distal ureter, which is most likely causing your pain. Vicodin, Pyridium, and Flomax prescribed for symptomatic relief. Stay hydrated and watch to ensure you are passing the stones.  Call and update your urologist.   Return to ED if symptoms persist or worsen before you can be seen by your urologist.  Follow up with your gynecologist for further evaluation of embedded IUD and ovarian cysts.

## 2023-04-20 NOTE — ED Notes (Signed)
Assumed care of pt. Pt complains of lower abdominal pain as well as lower right sided back pain rated at a 6.  Pt CAOx4 with equal chest rise and fall.

## 2023-04-20 NOTE — ED Triage Notes (Signed)
Pt had lithotripsy on Monday and immediately following surgery was recovering well per report. Yesterday began having some urinary retention.  Feeling "pressure" and extreme pain to the right flank.  Pt has not taken oxycodone since Monday night d/t nausea.  Did take zofran at 430 this morning.  Pt is tearful and visibly uncomfortable at time of triage.

## 2023-04-21 ENCOUNTER — Telehealth: Payer: Self-pay

## 2023-04-21 NOTE — Transitions of Care (Post Inpatient/ED Visit) (Signed)
04/21/2023  Name: Emma Mathews MRN: [id number] DOB: July 11, 1976  Today's TOC FU Call Status: TOC FU Call Complete Date: 04/21/23  Transition Care Management Follow-up Telephone Call Date of Discharge: 04/20/23 Discharge Facility: [facility]) Type of Discharge: Emergency Department Reason for ED Visit: Other: (kidney stone) How have you been since you were released from the hospital?: Better Any questions or concerns?: No  Items Reviewed: Did you receive and understand the discharge instructions provided?: Yes Medications obtained,verified, and reconciled?: Yes (Medications Reviewed) Any new allergies since your discharge?: No Dietary orders reviewed?: Yes Do you have support at home?: Yes People in Home: spouse  Medications Reviewed Today: Medications Reviewed Today     Reviewed by Karena Addison, LPN (Licensed Practical Nurse) on 04/21/23 at 1252  Med List Status: <None>   Medication Order Taking? Sig Documenting Provider Last Dose Status Informant  docusate sodium (COLACE) 100 MG capsule [id number]  Take 1 capsule (100 mg total) by mouth daily as needed for up to 30 doses. Jannifer Hick, MD  Active   HYDROcodone-acetaminophen (NORCO/VICODIN) 5-325 MG tablet [id number]  Take 1 tablet by mouth every 6 hours as needed for severe pain. Renne Crigler, PA-C  Active   hydrOXYzine (ATARAX) 10 MG tablet [id number] No Take 0.5-1 tablets (5-10 mg total) by mouth 3 (three) times daily as needed.  Patient taking differently: Take 5-10 mg by mouth as needed.   Sheliah Hatch, MD More than a month Active   levonorgestrel (MIRENA) 20 MCG/24HR IUD [id number] No Mirena 20 mcg/24 hours (5 yrs) 52 mg intrauterine device [provider] Taking Active   mometasone (NASONEX) 50 MCG/ACT nasal spray [id number] No Nasonex [provider] 04/17/2023 Active   Multiple Vitamins-Minerals (MULTIPLE VITAMINS/WOMENS PO) [id number] No Multiple Vitamin, Womens [provider]  04/13/2023 Active   ondansetron (ZOFRAN-ODT) 4 MG disintegrating tablet [id number] No Take 1 tablet (4 mg total) by mouth every 8 (eight) hours as needed for nausea or vomiting. Renne Crigler, PA-C 04/13/2023 Active   pantoprazole (PROTONIX) 40 MG tablet [id number] No Take 1 tablet (40 mg total) by mouth daily. Take 30 minutes before breakfast. Arnaldo Natal, NP 04/17/2023 Active   phenazopyridine (PYRIDIUM) 200 MG tablet [id number]  Take 1 tablet (200 mg) by mouth 3 times daily as needed for up to 3 days for pain. Maxwell Marion, PA-C  Active   rizatriptan (MAXALT-MLT) 10 MG disintegrating tablet [id number] No DISSOLVE 1 TABLET (10 MG TOTAL) BY MOUTH AS NEEDED. MAY REPEAT IN 2 HOURS IF NEEDED Levert Feinstein, MD More than a month Active            Med Note (CROWDER, DIAMOND   Tue Jun 08, 2022 12:42 PM) PRN  SUMAtriptan 6 MG/0.5ML Ivory Broad [id number] No Inject 6mg  at onset of migraine.  May repeat in 2 hrs, if needed.  Max dose: 2 injections/day. This is a 30 day prescription. Levert Feinstein, MD More than a month Active   tamsulosin (FLOMAX) 0.4 MG CAPS capsule [id number]  Take 1 capsule (0.4 mg total) by mouth daily for 14 days. Maxwell Marion, PA-C  Active   tirzepatide [facility]) 5 MG/0.5ML Pen [id number] No Inject 5 mg into the skin once a week. Elenore Paddy, NP 04/07/2023 Active             Home Care and Equipment/Supplies: Were Home Health Services Ordered?: NA Any new equipment or medical supplies ordered?: NA  Functional Questionnaire: Do you need assistance with bathing/showering or dressing?: No Do you  need assistance with meal preparation?: No Do you need assistance with eating?: No Do you have difficulty maintaining continence: No Do you need assistance with getting out of bed/getting out of a chair/moving?: No Do you have difficulty managing or taking your medications?: No  Follow up appointments reviewed: PCP Follow-up appointment confirmed?: NA Specialist Hospital Follow-up  appointment confirmed?: Yes Date of Specialist follow-up appointment?: 05/03/23 Follow-Up Specialty Provider:: uro Do you need transportation to your follow-up appointment?: No Do you understand care options if your condition(s) worsen?: Yes-patient verbalized understanding    SIGNATURE Karena Addison, LPN [facility] Nurse Health Advisor Direct Dial [phone]

## 2023-05-03 ENCOUNTER — Other Ambulatory Visit (INDEPENDENT_AMBULATORY_CARE_PROVIDER_SITE_OTHER): Payer: 59

## 2023-05-03 DIAGNOSIS — D582 Other hemoglobinopathies: Secondary | ICD-10-CM | POA: Diagnosis not present

## 2023-05-03 DIAGNOSIS — R3915 Urgency of urination: Secondary | ICD-10-CM | POA: Diagnosis not present

## 2023-05-03 DIAGNOSIS — N132 Hydronephrosis with renal and ureteral calculous obstruction: Secondary | ICD-10-CM | POA: Diagnosis not present

## 2023-05-03 DIAGNOSIS — R8271 Bacteriuria: Secondary | ICD-10-CM | POA: Diagnosis not present

## 2023-05-03 LAB — IRON: Iron: 85 ug/dL (ref 42–145)

## 2023-05-03 LAB — CBC
HCT: 46.6 % — ABNORMAL HIGH (ref 36.0–46.0)
Hemoglobin: 15.5 g/dL — ABNORMAL HIGH (ref 12.0–15.0)
MCHC: 33.3 g/dL (ref 30.0–36.0)
MCV: 86.8 fl (ref 78.0–100.0)
Platelets: 216 10*3/uL (ref 150.0–400.0)
RBC: 5.37 Mil/uL — ABNORMAL HIGH (ref 3.87–5.11)
RDW: 13.8 % (ref 11.5–15.5)
WBC: 5.2 10*3/uL (ref 4.0–10.5)

## 2023-05-03 LAB — FERRITIN: Ferritin: 91.7 ng/mL (ref 10.0–291.0)

## 2023-05-06 ENCOUNTER — Other Ambulatory Visit (HOSPITAL_COMMUNITY): Payer: Self-pay

## 2023-05-06 ENCOUNTER — Other Ambulatory Visit: Payer: Self-pay | Admitting: Nurse Practitioner

## 2023-05-06 DIAGNOSIS — D582 Other hemoglobinopathies: Secondary | ICD-10-CM

## 2023-05-06 MED ORDER — FLUCONAZOLE 200 MG PO TABS
200.0000 mg | ORAL_TABLET | Freq: Every day | ORAL | 0 refills | Status: DC
  Filled 2023-05-06 – 2023-06-06 (×2): qty 1, 1d supply, fill #0

## 2023-05-10 NOTE — Progress Notes (Signed)
Patient Care Team: Elenore Paddy, NP as PCP - General (Nurse Practitioner) Ob/Gyn, [facility] as Consulting Physician (Obstetrics and Gynecology)  DIAGNOSIS: No diagnosis found.  SUMMARY OF ONCOLOGIC HISTORY: Oncology History   No history exists.    CHIEF COMPLIANT:   INTERVAL HISTORY: Emma Mathews is a   ALLERGIES:  is allergic to diphenhydramine hcl, diphenhydramine hcl, iodinated contrast media, chlorhexidine gluconate, and codeine.  MEDICATIONS:  Current Outpatient Medications  Medication Sig Dispense Refill   docusate sodium (COLACE) 100 MG capsule Take 1 capsule (100 mg total) by mouth daily as needed for up to 30 doses. 30 capsule 0   fluconazole (DIFLUCAN) 200 MG tablet Take 1 tablet (200 mg total) by mouth daily. 1 tablet 0   HYDROcodone-acetaminophen (NORCO/VICODIN) 5-325 MG tablet Take 1 tablet by mouth every 6 hours as needed for severe pain. 10 tablet 0   hydrOXYzine (ATARAX) 10 MG tablet Take 0.5-1 tablets (5-10 mg total) by mouth 3 (three) times daily as needed. (Patient taking differently: Take 5-10 mg by mouth as needed.) 30 tablet 0   levonorgestrel (MIRENA) 20 MCG/24HR IUD Mirena 20 mcg/24 hours (5 yrs) 52 mg intrauterine device     mometasone (NASONEX) 50 MCG/ACT nasal spray Nasonex     Multiple Vitamins-Minerals (MULTIPLE VITAMINS/WOMENS PO) Multiple Vitamin, Womens     ondansetron (ZOFRAN-ODT) 4 MG disintegrating tablet Take 1 tablet (4 mg total) by mouth every 8 (eight) hours as needed for nausea or vomiting. 10 tablet 0   pantoprazole (PROTONIX) 40 MG tablet Take 1 tablet (40 mg total) by mouth daily. Take 30 minutes before breakfast. 90 tablet 1   rizatriptan (MAXALT-MLT) 10 MG disintegrating tablet DISSOLVE 1 TABLET (10 MG TOTAL) BY MOUTH AS NEEDED. MAY REPEAT IN 2 HOURS IF NEEDED 15 tablet 0   SUMAtriptan 6 MG/0.5ML SOAJ Inject 6mg  at onset of migraine.  May repeat in 2 hrs, if needed.  Max dose: 2 injections/day. This is a 30 day  prescription. 10 Cartridge 5   tirzepatide (MOUNJARO) 5 MG/0.5ML Pen Inject 5 mg into the skin once a week. 2 mL 1   No current facility-administered medications for this visit.    PHYSICAL EXAMINATION: ECOG PERFORMANCE STATUS: {CHL ONC ECOG PS:[id number]}  There were no vitals filed for this visit. There were no vitals filed for this visit.  BREAST:*** No palpable masses or nodules in either right or left breasts. No palpable axillary supraclavicular or infraclavicular adenopathy no breast tenderness or nipple discharge. (exam performed in the presence of a chaperone)  LABORATORY DATA:  I have reviewed the data as listed    Latest Ref Rng & Units 04/20/2023    7:46 AM 04/09/2023   11:40 AM 02/03/2023    3:19 PM  CMP  Glucose 70 - 99 mg/dL 96  85  79   BUN 6 - 20 mg/dL 12  14  16    Creatinine 0.44 - 1.00 mg/dL 5.40  9.81  1.91   Sodium 135 - 145 mmol/L 139  136  137   Potassium 3.5 - 5.1 mmol/L 3.7  4.5  3.7   Chloride 98 - 111 mmol/L 106  103  100   CO2 22 - 32 mmol/L 24  25  30    Calcium 8.9 - 10.3 mg/dL 9.5  9.8  47.8   Total Protein 6.5 - 8.1 g/dL 8.4     Total Bilirubin 0.3 - 1.2 mg/dL 0.7     Alkaline Phos 38 - 126 U/L 64  AST 15 - 41 U/L 20     ALT 0 - 44 U/L 19       Lab Results  Component Value Date   WBC 5.2 05/03/2023   HGB 15.5 (H) 05/03/2023   HCT 46.6 (H) 05/03/2023   MCV 86.8 05/03/2023   PLT 216.0 05/03/2023   NEUTROABS 5.3 04/20/2023    ASSESSMENT & PLAN:  No problem-specific Assessment & Plan notes found for this encounter.    No orders of the defined types were placed in this encounter.  The patient has a good understanding of the overall plan. she agrees with it. she will call with any problems that may develop before the next visit here. Total time spent: 30 mins including face to face time and time spent for planning, charting and co-ordination of care   Sherlyn Lick, CMA 05/10/23    I Janan Ridge am acting as a Neurosurgeon for  [employer]  ***

## 2023-05-11 ENCOUNTER — Encounter: Payer: Self-pay | Admitting: Hematology and Oncology

## 2023-05-11 ENCOUNTER — Inpatient Hospital Stay: Payer: 59 | Attending: Hematology and Oncology | Admitting: Hematology and Oncology

## 2023-05-11 ENCOUNTER — Other Ambulatory Visit: Payer: Self-pay

## 2023-05-11 ENCOUNTER — Inpatient Hospital Stay: Payer: 59

## 2023-05-11 VITALS — BP 124/70 | HR 83 | Temp 97.2°F | Resp 18 | Ht 66.0 in | Wt 203.8 lb

## 2023-05-11 DIAGNOSIS — Z885 Allergy status to narcotic agent status: Secondary | ICD-10-CM

## 2023-05-11 DIAGNOSIS — Z79899 Other long term (current) drug therapy: Secondary | ICD-10-CM

## 2023-05-11 DIAGNOSIS — D751 Secondary polycythemia: Secondary | ICD-10-CM | POA: Insufficient documentation

## 2023-05-11 DIAGNOSIS — D582 Other hemoglobinopathies: Secondary | ICD-10-CM | POA: Diagnosis not present

## 2023-05-11 HISTORY — DX: Secondary polycythemia: D75.1

## 2023-05-11 LAB — CBC WITH DIFFERENTIAL (CANCER CENTER ONLY)
Abs Immature Granulocytes: 0.01 10*3/uL (ref 0.00–0.07)
Basophils Absolute: 0 10*3/uL (ref 0.0–0.1)
Basophils Relative: 1 %
Eosinophils Absolute: 0.4 10*3/uL (ref 0.0–0.5)
Eosinophils Relative: 8 %
HCT: 44.1 % (ref 36.0–46.0)
Hemoglobin: 15.6 g/dL — ABNORMAL HIGH (ref 12.0–15.0)
Immature Granulocytes: 0 %
Lymphocytes Relative: 30 %
Lymphs Abs: 1.6 10*3/uL (ref 0.7–4.0)
MCH: 29.7 pg (ref 26.0–34.0)
MCHC: 35.4 g/dL (ref 30.0–36.0)
MCV: 84 fL (ref 80.0–100.0)
Monocytes Absolute: 0.4 10*3/uL (ref 0.1–1.0)
Monocytes Relative: 7 %
Neutro Abs: 3.1 10*3/uL (ref 1.7–7.7)
Neutrophils Relative %: 54 %
Platelet Count: 233 10*3/uL (ref 150–400)
RBC: 5.25 MIL/uL — ABNORMAL HIGH (ref 3.87–5.11)
RDW: 13.8 % (ref 11.5–15.5)
WBC Count: 5.5 10*3/uL (ref 4.0–10.5)
nRBC: 0 % (ref 0.0–0.2)

## 2023-05-11 NOTE — Assessment & Plan Note (Signed)
Lab review: 01/13/2023: Hemoglobin 15.7, hematocrit 46.2 05/03/2023: Hemoglobin 15.5, hematocrit 46.6  I discussed with the patient extensively the differential diagnosis of polycythemia 1. Primary polycythemia due to clonal stem cell abnormality 2. Secondary polycythemia due to cause that include hypoxia, heart or lung problems, altitude, athletics, erythropoietin producing lesion/tumors etc  Recommendation: 1. JAK-2 mutation testing to evaluate polycythemia vera 2. Erythropoietin level    Indications for phlebotomy 1. Primary polycythemia with hematocrit over 55 2. Secondary polycythemia with severe symptoms which include strokelike symptoms, severe recurrent headaches, severe fatigue.  Patient does not have any clear-cut symptoms that would require phlebotomy at this time. If the erythropoietin is elevated, he will need ultrasound of the liver and kidney for further evaluation.   Follow-up in 2 weeks by telephone visit

## 2023-05-13 ENCOUNTER — Other Ambulatory Visit (HOSPITAL_COMMUNITY): Payer: Self-pay

## 2023-05-13 ENCOUNTER — Telehealth: Payer: Self-pay | Admitting: Hematology and Oncology

## 2023-05-13 LAB — ERYTHROPOIETIN: Erythropoietin: 6.4 m[IU]/mL (ref 2.6–18.5)

## 2023-05-16 ENCOUNTER — Telehealth: Payer: Self-pay | Admitting: Hematology and Oncology

## 2023-05-17 ENCOUNTER — Inpatient Hospital Stay: Payer: 59 | Attending: Hematology and Oncology

## 2023-05-17 ENCOUNTER — Encounter: Payer: Self-pay | Admitting: Hematology and Oncology

## 2023-05-17 ENCOUNTER — Inpatient Hospital Stay: Payer: 59

## 2023-05-17 ENCOUNTER — Other Ambulatory Visit: Payer: Self-pay

## 2023-05-17 ENCOUNTER — Telehealth: Payer: Self-pay

## 2023-05-17 VITALS — BP 114/80 | HR 85 | Temp 98.3°F | Resp 16

## 2023-05-17 DIAGNOSIS — D751 Secondary polycythemia: Secondary | ICD-10-CM | POA: Insufficient documentation

## 2023-05-17 NOTE — Progress Notes (Signed)
Per OV notes phlebotomy indications contacted Dr. Pamelia Hoit for clarification for Hct 44.1 and Hgb 15.6. Per Dr. Pamelia Hoit cancel phlebotomy. Patient made aware.

## 2023-05-17 NOTE — Progress Notes (Signed)
OK to proceed with 05/17/23 1615 phlebotomy appointment regardless of CBC results/indications per Serena Croissant, MD.

## 2023-05-17 NOTE — Patient Instructions (Signed)

## 2023-05-17 NOTE — Progress Notes (Signed)
Emma Mathews presents today for phlebotomy per MD orders. Phlebotomy procedure started at 1711 and ended at 1725 using 20G IV.  220 grams removed. Patient declined to stay for 30 minute post observation, given beverage. Patient tolerated procedure well. IV needle removed intact.

## 2023-05-17 NOTE — Telephone Encounter (Signed)
Called Pt regarding MyChart message. Pt originally scheduled for 05/17/23 0830 phlebotomy appt. HCT 44.1, indication for phlebotomy per MD listed as 55. Secure chat with infusion RN, desk RN, and MD started by infusion RN. Infusion RN asking if phlebotomy appt should continue or be cancelled. MD responded to chat to cancel appt. Pt relayed in MyChart message that phlebotomy appt was to see if sx improved regardless of labwork. Consulted MD who agrees to bring Pt in for phlebotomy appt. Infusion charge RN scheduled 05/17/23 1615 appt per Pt's availability. Expressed deepest apologies to Pt regarding phlebotomy appt. Pt verbalized understanding.

## 2023-05-18 LAB — JAK2 (INCLUDING V617F AND EXON 12), MPL,& CALR W/RFL MPN PANEL (NGS)

## 2023-05-25 ENCOUNTER — Other Ambulatory Visit (HOSPITAL_COMMUNITY): Payer: Self-pay

## 2023-05-25 DIAGNOSIS — S20461A Insect bite (nonvenomous) of right back wall of thorax, initial encounter: Secondary | ICD-10-CM | POA: Diagnosis not present

## 2023-05-25 DIAGNOSIS — D485 Neoplasm of uncertain behavior of skin: Secondary | ICD-10-CM | POA: Diagnosis not present

## 2023-05-25 DIAGNOSIS — L308 Other specified dermatitis: Secondary | ICD-10-CM | POA: Diagnosis not present

## 2023-05-25 DIAGNOSIS — L298 Other pruritus: Secondary | ICD-10-CM | POA: Diagnosis not present

## 2023-05-25 DIAGNOSIS — L538 Other specified erythematous conditions: Secondary | ICD-10-CM | POA: Diagnosis not present

## 2023-05-25 MED ORDER — DOXYCYCLINE HYCLATE 100 MG PO CAPS
100.0000 mg | ORAL_CAPSULE | Freq: Two times a day (BID) | ORAL | 0 refills | Status: DC
  Filled 2023-05-25: qty 20, 10d supply, fill #0

## 2023-05-26 NOTE — Progress Notes (Signed)
HEMATOLOGY-ONCOLOGY TELEPHONE VISIT PROGRESS NOTE  I connected with our patient on 05/27/23 at  8:45 AM EDT by telephone and verified that I am speaking with the correct person using two identifiers.  I discussed the limitations, risks, security and privacy concerns of performing an evaluation and management service by telephone and the availability of in person appointments.  I also discussed with the patient that there may be a patient responsible charge related to this service. The patient expressed understanding and agreed to proceed.   History of Present Illness: Emma Mathews is a 47 year old has been referred with above-mentioned history of profound itching. She presents to the clinic for a telephone follow-up.  She had remarkable improvement in the itching symptoms after removing some blood.  Unfortunately because it clotted they were not able to remove as much as they would like to.  REVIEW OF SYSTEMS:   Constitutional: Denies fevers, chills or abnormal weight loss All other systems were reviewed with the patient and are negative. Observations/Objective:     Assessment Plan:  Polycythemia, secondary Profound itching of the extremities with exposure to heat (especially hot water or hot weather)   Lab review: 01/13/2023: Hemoglobin 15.7, hematocrit 46.2 05/03/2023: Hemoglobin 15.5, hematocrit 46.6 05/17/2023: MPN panel: Normal, erythropoietin 6.4, hemoglobin 15.6  Phlebotomy: 05/17/2023 (small amount only removed)  I discussed with the patient the results of the MPN panel as well as erythropoietin which are both normal. Itching resolved with phlebotomy.  2 month Phlebotomy (as needed for itching): Please do phlebotomy without checking blood because we are doing phlebotomy not necessarily for the elevated hemoglobin but for her symptoms of itching  6 months Lab and follow up with me along with phlebotomy  I discussed the assessment and treatment plan with the patient. The patient  was provided an opportunity to ask questions and all were answered. The patient agreed with the plan and demonstrated an understanding of the instructions. The patient was advised to call back or seek an in-person evaluation if the symptoms worsen or if the condition fails to improve as anticipated.   I provided 12 minutes of non-face-to-face time during this encounter.  This includes time for charting and coordination of care   Tamsen Meek, MD  I Janan Ridge am acting as a scribe for Dr.Kiyan Burmester  I have reviewed the above documentation for accuracy and completeness, and I agree with the above.

## 2023-05-27 ENCOUNTER — Inpatient Hospital Stay (HOSPITAL_BASED_OUTPATIENT_CLINIC_OR_DEPARTMENT_OTHER): Payer: 59 | Admitting: Hematology and Oncology

## 2023-05-27 DIAGNOSIS — D751 Secondary polycythemia: Secondary | ICD-10-CM | POA: Diagnosis not present

## 2023-05-27 NOTE — Assessment & Plan Note (Signed)
Profound itching of the extremities with exposure to heat (especially hot water or hot weather) Differential diagnosis: Polycythemia, mastocytosis, liver dysfunction, Porphyria   Lab review: 01/13/2023: Hemoglobin 15.7, hematocrit 46.2 05/03/2023: Hemoglobin 15.5, hematocrit 46.6 05/17/2023: MPN panel: Normal, erythropoietin 6.4, hemoglobin 15.6  Phlebotomy: 05/17/2023  I discussed with the patient the results of the MPN panel as well as erythropoietin which are both normal. I also discussed whether phlebotomy helped her itching symptoms.

## 2023-06-01 ENCOUNTER — Encounter: Payer: Self-pay | Admitting: Hematology and Oncology

## 2023-06-01 ENCOUNTER — Other Ambulatory Visit (HOSPITAL_COMMUNITY): Payer: Self-pay

## 2023-06-01 MED ORDER — GEMTESA 75 MG PO TABS
75.0000 mg | ORAL_TABLET | Freq: Every day | ORAL | 11 refills | Status: DC
  Filled 2023-06-01: qty 30, 30d supply, fill #0
  Filled 2023-07-05 – 2023-10-30 (×3): qty 30, 30d supply, fill #1
  Filled 2024-01-27: qty 30, 30d supply, fill #2
  Filled 2024-03-13: qty 30, 30d supply, fill #3
  Filled 2024-04-10: qty 30, 30d supply, fill #4

## 2023-06-02 ENCOUNTER — Other Ambulatory Visit (HOSPITAL_COMMUNITY): Payer: Self-pay

## 2023-06-06 ENCOUNTER — Encounter: Payer: Self-pay | Admitting: Hematology and Oncology

## 2023-06-06 ENCOUNTER — Encounter (HOSPITAL_COMMUNITY): Payer: Self-pay

## 2023-06-06 ENCOUNTER — Other Ambulatory Visit (HOSPITAL_COMMUNITY): Payer: Self-pay

## 2023-06-06 ENCOUNTER — Other Ambulatory Visit: Payer: Self-pay

## 2023-06-07 ENCOUNTER — Other Ambulatory Visit (HOSPITAL_COMMUNITY): Payer: Self-pay

## 2023-06-08 ENCOUNTER — Other Ambulatory Visit (HOSPITAL_COMMUNITY): Payer: Self-pay

## 2023-06-10 ENCOUNTER — Other Ambulatory Visit (HOSPITAL_COMMUNITY): Payer: Self-pay

## 2023-06-10 ENCOUNTER — Telehealth: Payer: 59 | Admitting: Family Medicine

## 2023-06-10 ENCOUNTER — Encounter: Payer: Self-pay | Admitting: Hematology and Oncology

## 2023-06-10 DIAGNOSIS — J069 Acute upper respiratory infection, unspecified: Secondary | ICD-10-CM

## 2023-06-10 MED ORDER — AZITHROMYCIN 250 MG PO TABS
ORAL_TABLET | ORAL | 0 refills | Status: AC
  Filled 2023-06-10: qty 6, 5d supply, fill #0

## 2023-06-10 MED ORDER — BENZONATATE 200 MG PO CAPS
200.0000 mg | ORAL_CAPSULE | Freq: Two times a day (BID) | ORAL | 0 refills | Status: DC | PRN
  Filled 2023-06-10: qty 20, 10d supply, fill #0

## 2023-06-10 NOTE — Progress Notes (Signed)

## 2023-06-15 ENCOUNTER — Encounter (INDEPENDENT_AMBULATORY_CARE_PROVIDER_SITE_OTHER): Payer: Self-pay

## 2023-06-16 ENCOUNTER — Other Ambulatory Visit: Payer: Self-pay | Admitting: Oncology

## 2023-06-16 DIAGNOSIS — Z006 Encounter for examination for normal comparison and control in clinical research program: Secondary | ICD-10-CM

## 2023-07-05 ENCOUNTER — Other Ambulatory Visit (HOSPITAL_COMMUNITY): Payer: Self-pay

## 2023-07-05 ENCOUNTER — Other Ambulatory Visit: Payer: Self-pay

## 2023-07-06 ENCOUNTER — Other Ambulatory Visit: Payer: Self-pay

## 2023-07-06 ENCOUNTER — Encounter: Payer: Self-pay | Admitting: Pharmacist

## 2023-07-06 ENCOUNTER — Institutional Professional Consult (permissible substitution): Payer: 59 | Admitting: Plastic Surgery

## 2023-07-07 ENCOUNTER — Other Ambulatory Visit (HOSPITAL_COMMUNITY): Payer: Self-pay

## 2023-07-07 MED ORDER — MIRABEGRON ER 50 MG PO TB24
50.0000 mg | ORAL_TABLET | Freq: Every day | ORAL | 3 refills | Status: DC
  Filled 2023-07-07: qty 90, 90d supply, fill #0

## 2023-07-08 ENCOUNTER — Encounter: Payer: Self-pay | Admitting: Nurse Practitioner

## 2023-07-08 ENCOUNTER — Ambulatory Visit: Payer: 59 | Admitting: Nurse Practitioner

## 2023-07-08 VITALS — BP 126/74 | HR 86 | Temp 98.6°F | Ht 66.0 in | Wt 192.4 lb

## 2023-07-08 DIAGNOSIS — Z6831 Body mass index (BMI) 31.0-31.9, adult: Secondary | ICD-10-CM

## 2023-07-08 DIAGNOSIS — Z23 Encounter for immunization: Secondary | ICD-10-CM

## 2023-07-08 DIAGNOSIS — Z13220 Encounter for screening for lipoid disorders: Secondary | ICD-10-CM

## 2023-07-08 DIAGNOSIS — E669 Obesity, unspecified: Secondary | ICD-10-CM | POA: Diagnosis not present

## 2023-07-08 DIAGNOSIS — Z719 Counseling, unspecified: Secondary | ICD-10-CM | POA: Insufficient documentation

## 2023-07-08 DIAGNOSIS — Z0000 Encounter for general adult medical examination without abnormal findings: Secondary | ICD-10-CM | POA: Diagnosis not present

## 2023-07-08 DIAGNOSIS — Z0001 Encounter for general adult medical examination with abnormal findings: Secondary | ICD-10-CM | POA: Insufficient documentation

## 2023-07-08 LAB — TSH: TSH: 1.2 u[IU]/mL (ref 0.35–5.50)

## 2023-07-08 LAB — CBC
HCT: 44.1 % (ref 36.0–46.0)
Hemoglobin: 14.7 g/dL (ref 12.0–15.0)
MCHC: 33.3 g/dL (ref 30.0–36.0)
MCV: 88.3 fl (ref 78.0–100.0)
Platelets: 226 10*3/uL (ref 150.0–400.0)
RBC: 4.99 Mil/uL (ref 3.87–5.11)
RDW: 13.6 % (ref 11.5–15.5)
WBC: 5.3 10*3/uL (ref 4.0–10.5)

## 2023-07-08 LAB — COMPREHENSIVE METABOLIC PANEL
ALT: 10 U/L (ref 0–35)
AST: 14 U/L (ref 0–37)
Albumin: 4.3 g/dL (ref 3.5–5.2)
Alkaline Phosphatase: 70 U/L (ref 39–117)
BUN: 14 mg/dL (ref 6–23)
CO2: 26 mEq/L (ref 19–32)
Calcium: 9.6 mg/dL (ref 8.4–10.5)
Chloride: 106 mEq/L (ref 96–112)
Creatinine, Ser: 0.81 mg/dL (ref 0.40–1.20)
GFR: 87.28 mL/min (ref >60.00)
Glucose, Bld: 85 mg/dL (ref 70–99)
Potassium: 4 mEq/L (ref 3.5–5.1)
Sodium: 140 mEq/L (ref 135–145)
Total Bilirubin: 0.8 mg/dL (ref 0.2–1.2)
Total Protein: 7.8 g/dL (ref 6.0–8.3)

## 2023-07-08 LAB — LIPID PANEL
Cholesterol: 143 mg/dL (ref 0–200)
HDL: 29.5 mg/dL — ABNORMAL LOW (ref >39.00)
LDL Cholesterol: 96 mg/dL (ref 0–99)
NonHDL: 113.21
Total CHOL/HDL Ratio: 5
Triglycerides: 86 mg/dL (ref 0.0–149.0)
VLDL: 17.2 mg/dL (ref 0.0–40.0)

## 2023-07-08 LAB — HEMOGLOBIN A1C: Hgb A1c MFr Bld: 5 % (ref 4.6–6.5)

## 2023-07-08 NOTE — Addendum Note (Signed)
Addended by: Aundra Millet on: 07/08/2023 09:03 AM   Modules accepted: Orders

## 2023-07-08 NOTE — Assessment & Plan Note (Signed)
Tdap administered, VIS provided.

## 2023-07-08 NOTE — Progress Notes (Signed)
Complete physical exam  Patient: Emma Mathews   DOB: 1976-08-04   47 y.o. Female  MRN: [id number]  Subjective:    Chief Complaint  Patient presents with   Annual Exam    Physical. No other concerns    Emma Mathews is a 47 y.o. female who presents today for a complete physical exam. She reports consuming a general diet.  Exercise: 2x a week olympic weight lifting (squats, deadlifts, etc), 2-3x/week 2-3mile walk.  She generally feels well. She reports sleeping well. She does not have additional problems to discuss today.    Most recent fall risk assessment:    07/08/2023    8:08 AM  Fall Risk   Falls in the past year? 0  Number falls in past yr: 0  Injury with Fall? 0  Risk for fall due to : No Fall Risks  Follow up Falls evaluation completed     Most recent depression screenings:    07/08/2023    8:08 AM 02/03/2023    2:53 PM  PHQ 2/9 Scores  PHQ - 2 Score 0 1  PHQ- 9 Score  3    Vision:Within last year and Dental: No current dental problems and Receives regular dental care  Patient Active Problem List   Diagnosis Date Noted   Encounter for general adult medical examination with abnormal findings 07/08/2023   Need for vaccination 07/08/2023   Polycythemia, secondary 05/11/2023   Obesity (BMI 30-39.9) 01/13/2023   Acquired equinus deformity of left foot 01/08/2020   Low back pain 10/26/2019   Chronic migraine with aura 03/27/2018   Headache 02/01/2018   Motion sickness 02/01/2018   Degenerative TFCC tear, right 10/25/2017   ADD (attention deficit disorder) 10/28/2009   ALLERGIC RHINITIS 02/25/2009   Past Medical History:  Diagnosis Date   Anemia    Anxiety    Appendicitis, acute 10/02/2013   Cancer (HCC)    melanoma excision in 2013   Cholecystitis    Complication of anesthesia    slow to wake up   Depression    Encounter for lipid screening for cardiovascular disease 01/13/2023   Headache    migraine   Mammographic breast lesion 06/28/2017    S/p biopsy, benign    Plantar fasciitis, left 06/05/2019   Pneumonia 2010   Polycythemia, secondary 05/11/2023   PONV (postoperative nausea and vomiting)    Seasonal allergies    STRESS FRACTURE, FOOT 04/08/2009   Qualifier: Diagnosis of  By: Lovell Sheehan MD, Balinda Quails    Vision abnormalities    s/p Lasik   Past Surgical History:  Procedure Laterality Date   CHOLECYSTECTOMY     EXTRACORPOREAL SHOCK WAVE LITHOTRIPSY Right 04/18/2023   Procedure: RIGHT EXTRACORPOREAL SHOCK WAVE LITHOTRIPSY (ESWL);  Surgeon: Jannifer Hick, MD;  Location: [facility];  Service: Urology;  Laterality: Right;   EYE SURGERY Bilateral    PRK   Labrum Repair Right 11/15/1998   Open   LAPAROSCOPIC APPENDECTOMY N/A 09/02/2013   Procedure: APPENDECTOMY LAPAROSCOPIC;  Surgeon: Shelly Rubenstein, MD;  Location: [facility] OR;  Service: General;  Laterality: N/A;   SHOULDER ARTHROSCOPY Left 11/16/2003   bone spurs    SHOULDER ARTHROSCOPY Right 11/01/2017   Procedure: RIGHT SHOULDER ARTHROSCOPY WITH SUBACROMIAL DECOMPRESSION;  Surgeon: Cammy Copa, MD;  Location[facility];  Service: Orthopedics;  Laterality: Right;   SHOULDER ARTHROSCOPY WITH SUBACROMIAL DECOMPRESSION, ROTATOR CUFF REPAIR AND BICEP TENDON REPAIR Right 02/24/2016   Procedure: RIGHT SHOULDER ARTHROSCOPY WITH DEBRIDEMENT, BICEPS  TENODESIS;  Surgeon: Cammy Copa, MD;  Location: [facility] OR;  Service: Orthopedics;  Laterality: Right;   SHOULDER SURGERY Right 11/15/2005   Revision and repair flap   WISDOM TOOTH EXTRACTION     WRIST ARTHROSCOPY Right 11/09/2017   Procedure: ARTHROSCOPY WRIST WITH DEBRIDEMENT;  Surgeon: Dairl Ponder, MD;  Location: [facility];  Service: Orthopedics;  Laterality: Right;   Social History   Socioeconomic History   Marital status: Married    Spouse name: Clifton Custard   Number of children: 1   Years of education: Not on file   Highest education level: Not on file  Occupational History   Occupation:  Print production planner  Tobacco Use   Smoking status: Never   Smokeless tobacco: Never  Vaping Use   Vaping status: Never Used  Substance and Sexual Activity   Alcohol use: Yes    Comment: ocassional - social 1- 2 drinks   Drug use: No   Sexual activity: Yes    Birth control/protection: I.U.D.  Other Topics Concern   Not on file  Social History Narrative   Patient lives with husband and one child, a son named Max.    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    Social Determinants of Health   Financial Resource Strain: Not on file  Food Insecurity: Not on file  Transportation Needs: Not on file  Physical Activity: Not on file  Stress: Not on file  Social Connections: Not on file  Intimate Partner Violence: Not on file   Family History  Adopted: Yes  Family history unknown: Yes   Allergies  Allergen Reactions   Diphenhydramine Hcl Hives and Other (See Comments)    Pt states she can take Dye free Benadryl   Diphenhydramine Hcl Other (See Comments) and Hives    Pt states she can take Dye free Benadryl   Iodinated Contrast Media Shortness Of Breath, Nausea And Vomiting, Other (See Comments) and Anaphylaxis    Shortness of breath   Chlorhexidine Gluconate Hives   Codeine Hives, Nausea And Vomiting and Rash      Patient Care Team: Elenore Paddy, NP as PCP - General (Nurse Practitioner) Ob/Gyn, [facility] as Consulting Physician (Obstetrics and Gynecology)   Outpatient Medications Prior to Visit  Medication Sig   levonorgestrel (MIRENA) 20 MCG/24HR IUD Mirena 20 mcg/24 hours (5 yrs) 52 mg intrauterine device   mirabegron ER (MYRBETRIQ) 50 MG TB24 tablet Take 1 tablet (50 mg total) by mouth daily.   mometasone (NASONEX) 50 MCG/ACT nasal spray Nasonex   Multiple Vitamins-Minerals (MULTIPLE VITAMINS/WOMENS PO) Multiple Vitamin, Womens   pantoprazole (PROTONIX) 40 MG tablet Take 1 tablet (40 mg total) by mouth daily. Take 30 minutes before  breakfast.   rizatriptan (MAXALT-MLT) 10 MG disintegrating tablet DISSOLVE 1 TABLET (10 MG TOTAL) BY MOUTH AS NEEDED. MAY REPEAT IN 2 HOURS IF NEEDED   SUMAtriptan 6 MG/0.5ML SOAJ Inject 6mg  at onset of migraine.  May repeat in 2 hrs, if needed.  Max dose: 2 injections/day. This is a 30 day prescription.   tirzepatide [facility]) 5 MG/0.5ML Pen Inject 5 mg into the skin once a week.   Vibegron (GEMTESA) 75 MG TABS Take 1 tablet (75 mg total) by mouth daily.   [DISCONTINUED] benzonatate (TESSALON) 200 MG capsule Take 1 capsule (200 mg) by mouth 2 times daily as needed for cough.   [DISCONTINUED] docusate sodium (COLACE) 100 MG capsule Take 1 capsule (100 mg total) by mouth  daily as needed for up to 30 doses.   [DISCONTINUED] doxycycline (VIBRAMYCIN) 100 MG capsule Take 1 capsule (100 mg total) by mouth 2 (two) times daily with a full meal for 10 days   [DISCONTINUED] fluconazole (DIFLUCAN) 200 MG tablet Take 1 tablet (200 mg) by mouth as directed   [DISCONTINUED] HYDROcodone-acetaminophen (NORCO/VICODIN) 5-325 MG tablet Take 1 tablet by mouth every 6 hours as needed for severe pain.   [DISCONTINUED] hydrOXYzine (ATARAX) 10 MG tablet Take 0.5-1 tablets (5-10 mg total) by mouth 3 (three) times daily as needed. (Patient taking differently: Take 5-10 mg by mouth as needed.)   [DISCONTINUED] ondansetron (ZOFRAN-ODT) 4 MG disintegrating tablet Take 1 tablet (4 mg total) by mouth every 8 (eight) hours as needed for nausea or vomiting.   No facility-administered medications prior to visit.    Review of Systems  Constitutional:  Positive for weight loss (intentional). Negative for chills, fever and malaise/fatigue.  HENT:  Negative for hearing loss and tinnitus.   Eyes:  Negative for blurred vision and double vision.  Respiratory:  Negative for cough and wheezing.   Cardiovascular:  Negative for chest pain and palpitations.  Gastrointestinal:  Negative for abdominal pain, blood in stool, diarrhea, nausea  and vomiting.  Genitourinary:  Negative for hematuria.  Skin:  Negative for rash.  Neurological:  Negative for seizures and loss of consciousness.  Psychiatric/Behavioral:  Negative for depression and suicidal ideas. The patient is not nervous/anxious.           Objective:     BP 126/74 (BP Location: Left Arm, Patient Position: Sitting, Cuff Size: Normal)   Pulse 86   Temp 98.6 F (37 C) (Oral)   Ht 5\' 6"  (1.676 m)   Wt 192 lb 6.4 oz (87.3 kg)   SpO2 100%   BMI 31.05 kg/m  BP Readings from Last 3 Encounters:  07/08/23 126/74  05/17/23 114/80  05/17/23 118/72   Wt Readings from Last 3 Encounters:  07/08/23 192 lb 6.4 oz (87.3 kg)  05/11/23 203 lb 12.8 oz (92.4 kg)  04/18/23 209 lb 14.4 oz (95.2 kg)        07/08/2023    8:08 AM 02/03/2023    2:53 PM 01/13/2023   11:28 AM  PHQ9 SCORE ONLY  PHQ-9 Total Score 0 3 3     Physical Exam Vitals reviewed.  Constitutional:      Appearance: Normal appearance.  HENT:     Head: Normocephalic and atraumatic.     Right Ear: Tympanic membrane, ear canal and external ear normal.     Left Ear: Tympanic membrane, ear canal and external ear normal.  Eyes:     General:        Right eye: No discharge.        Left eye: No discharge.     Extraocular Movements: Extraocular movements intact.     Conjunctiva/sclera: Conjunctivae normal.     Pupils: Pupils are equal, round, and reactive to light.  Neck:     Vascular: No carotid bruit.  Cardiovascular:     Rate and Rhythm: Normal rate and regular rhythm.     Pulses: Normal pulses.     Heart sounds: Normal heart sounds. No murmur heard. Pulmonary:     Effort: Pulmonary effort is normal.     Breath sounds: Normal breath sounds.  Chest:     Comments: Breast exam deferred per patient preference Abdominal:     General: Abdomen is flat. Bowel sounds are normal. There is  no distension.     Palpations: Abdomen is soft. There is no mass.     Tenderness: There is no abdominal tenderness.   Musculoskeletal:        General: No tenderness.     Cervical back: Neck supple. No muscular tenderness.     Right lower leg: No edema.     Left lower leg: No edema.  Lymphadenopathy:     Cervical: No cervical adenopathy.     Upper Body:     Right upper body: No supraclavicular adenopathy.     Left upper body: No supraclavicular adenopathy.  Skin:    General: Skin is warm and dry.  Neurological:     General: No focal deficit present.     Mental Status: She is alert and oriented to person, place, and time.     Motor: No weakness.     Gait: Gait normal.  Psychiatric:        Mood and Affect: Mood normal.        Behavior: Behavior normal.        Judgment: Judgment normal.      No results found for any visits on 07/08/23.     Assessment & Plan:    Routine Health Maintenance and Physical Exam  Immunization History  Administered Date(s) Administered   Influenza,inj,Quad PF,6+ Mos 07/26/2019   Influenza-Unspecified 08/06/2017, 08/01/2018, 08/22/2020   PFIZER(Purple Top)SARS-COV-2 Vaccination 11/20/2019, 09/08/2020, 11/04/2020   Typhoid Live 12/28/2017, 02/21/2018    Health Maintenance  Topic Date Due   DTaP/Tdap/Td (1 - Tdap) Never done   INFLUENZA VACCINE  02/13/2024 (Originally 06/16/2023)   PAP SMEAR-Modifier  08/14/2024 (Originally 06/18/2020)   Colonoscopy  03/06/2030   Hepatitis C Screening  Completed   HIV Screening  Completed   HPV VACCINES  Aged Out   COVID-19 Vaccine  Discontinued    Discussed health benefits of physical activity, and encouraged her to engage in regular exercise appropriate for her age and condition.  Problem List Items Addressed This Visit       Other   Obesity (BMI 30-39.9)    Chronic, stable Patient has lost about 18% of her weight since starting Tirzepatide She is currently taking compounded version as prescribed by a different provider.  We did discuss FDA warning related to compounded versions.  She reports she is tolerating medication  well. Patient to follow-up in 1 year or sooner as needed.      Relevant Orders   CBC   Comprehensive metabolic panel   Hemoglobin A1c   Lipid panel   TSH   Encounter for general adult medical examination with abnormal findings - Primary    Due for Tdap.  Due for Pap smear and mammogram which she plans on getting completed at [facility] next month. Otherwise up-to-date with screening recommendations for a female of her age. We discussed healthy lifestyle and she will continue to focus on diet and regular exercise. Screening recommendations discussed, handout provided. VIS for Tdap provided as well.      Relevant Orders   CBC   Comprehensive metabolic panel   Hemoglobin A1c   Lipid panel   TSH   Need for vaccination    Tdap administered, VIS provided.      Return in about 1 year (around 07/07/2024) for CPE with Milas Schappell.     Elenore Paddy, NP

## 2023-07-08 NOTE — Assessment & Plan Note (Signed)
Chronic, stable Patient has lost about 18% of her weight since starting Tirzepatide She is currently taking compounded version as prescribed by a different provider.  We did discuss FDA warning related to compounded versions.  She reports she is tolerating medication well. Patient to follow-up in 1 year or sooner as needed.

## 2023-07-08 NOTE — Assessment & Plan Note (Signed)
Due for Tdap.  Due for Pap smear and mammogram which she plans on getting completed at [facility] next month. Otherwise up-to-date with screening recommendations for a female of her age. We discussed healthy lifestyle and she will continue to focus on diet and regular exercise. Screening recommendations discussed, handout provided. VIS for Tdap provided as well.

## 2023-07-12 ENCOUNTER — Other Ambulatory Visit: Payer: Self-pay | Admitting: Nurse Practitioner

## 2023-07-12 ENCOUNTER — Other Ambulatory Visit: Payer: Self-pay

## 2023-07-12 ENCOUNTER — Other Ambulatory Visit (HOSPITAL_COMMUNITY): Payer: Self-pay

## 2023-07-12 MED ORDER — PANTOPRAZOLE SODIUM 40 MG PO TBEC
40.0000 mg | DELAYED_RELEASE_TABLET | Freq: Every day | ORAL | 1 refills | Status: DC
  Filled 2023-07-12 – 2023-07-28 (×2): qty 90, 90d supply, fill #0
  Filled 2023-10-30: qty 90, 90d supply, fill #1

## 2023-07-22 ENCOUNTER — Other Ambulatory Visit (HOSPITAL_COMMUNITY): Payer: Self-pay

## 2023-07-28 ENCOUNTER — Other Ambulatory Visit (HOSPITAL_COMMUNITY): Payer: Self-pay

## 2023-07-28 ENCOUNTER — Inpatient Hospital Stay: Payer: 59 | Attending: Hematology and Oncology

## 2023-07-28 ENCOUNTER — Ambulatory Visit (INDEPENDENT_AMBULATORY_CARE_PROVIDER_SITE_OTHER): Payer: 59 | Admitting: Plastic Surgery

## 2023-07-28 ENCOUNTER — Encounter: Payer: Self-pay | Admitting: Plastic Surgery

## 2023-07-28 ENCOUNTER — Inpatient Hospital Stay: Payer: 59

## 2023-07-28 VITALS — BP 123/78 | HR 79 | Ht 66.0 in | Wt 189.0 lb

## 2023-07-28 DIAGNOSIS — L989 Disorder of the skin and subcutaneous tissue, unspecified: Secondary | ICD-10-CM

## 2023-07-28 DIAGNOSIS — D751 Secondary polycythemia: Secondary | ICD-10-CM | POA: Diagnosis not present

## 2023-07-28 LAB — CBC WITH DIFFERENTIAL (CANCER CENTER ONLY)
Abs Immature Granulocytes: 0.01 10*3/uL (ref 0.00–0.07)
Basophils Absolute: 0 10*3/uL (ref 0.0–0.1)
Basophils Relative: 1 %
Eosinophils Absolute: 0.4 10*3/uL (ref 0.0–0.5)
Eosinophils Relative: 9 %
HCT: 43.7 % (ref 36.0–46.0)
Hemoglobin: 15.3 g/dL — ABNORMAL HIGH (ref 12.0–15.0)
Immature Granulocytes: 0 %
Lymphocytes Relative: 25 %
Lymphs Abs: 1.3 10*3/uL (ref 0.7–4.0)
MCH: 30 pg (ref 26.0–34.0)
MCHC: 35 g/dL (ref 30.0–36.0)
MCV: 85.7 fL (ref 80.0–100.0)
Monocytes Absolute: 0.5 10*3/uL (ref 0.1–1.0)
Monocytes Relative: 9 %
Neutro Abs: 2.9 10*3/uL (ref 1.7–7.7)
Neutrophils Relative %: 56 %
Platelet Count: 232 10*3/uL (ref 150–400)
RBC: 5.1 MIL/uL (ref 3.87–5.11)
RDW: 12.7 % (ref 11.5–15.5)
WBC Count: 5.1 10*3/uL (ref 4.0–10.5)
nRBC: 0 % (ref 0.0–0.2)

## 2023-07-28 NOTE — Progress Notes (Signed)
Emma Mathews presents today for phlebotomy per MD orders. Phlebotomy procedure started at 1009 and ended at 1029. Pt was a difficult IV stick. Winnifred Friar, RN placed an ultrasound guided 16g IV to R distal vein.  496 cc removed. Patient tolerated procedure well. IV needle removed intact. VSS and pt discharged in stable condition.

## 2023-07-28 NOTE — Patient Instructions (Signed)

## 2023-07-28 NOTE — Progress Notes (Signed)
Referring Provider Elenore Paddy, NP [address] [place],  [place] [address]   CC:  Chief Complaint  Patient presents with   consult      Emma Mathews is an 47 y.o. female.  HPI: [name] is referred for excision of benign skin lesions on her face.  She has a long history of skin lesions including a melanoma on her back.  She is frequently seen by her dermatologist who evaluates the skin lesions for her.  The 3 skin lesions that she would like to have removed 1 on the left side of her nose and 2 on the left upper lip have all been evaluated and felt to be benign.  She presents today with a request for excision of these lesions  Allergies  Allergen Reactions   Diphenhydramine Hcl Hives and Other (See Comments)    Pt states she can take Dye free Benadryl   Diphenhydramine Hcl Other (See Comments) and Hives    Pt states she can take Dye free Benadryl   Iodinated Contrast Media Shortness Of Breath, Nausea And Vomiting, Other (See Comments) and Anaphylaxis    Shortness of breath   Chlorhexidine Gluconate Hives   Codeine Hives, Nausea And Vomiting and Rash    Outpatient Encounter Medications as of 07/28/2023  Medication Sig Note   levonorgestrel (MIRENA) 20 MCG/24HR IUD Mirena 20 mcg/24 hours (5 yrs) 52 mg intrauterine device    mirabegron ER (MYRBETRIQ) 50 MG TB24 tablet Take 1 tablet (50 mg total) by mouth daily.    mometasone (NASONEX) 50 MCG/ACT nasal spray Nasonex    Multiple Vitamins-Minerals (MULTIPLE VITAMINS/WOMENS PO) Multiple Vitamin, Womens    pantoprazole (PROTONIX) 40 MG tablet Take 1 tablet (40 mg total) by mouth daily. Take 30 minutes before breakfast.    rizatriptan (MAXALT-MLT) 10 MG disintegrating tablet DISSOLVE 1 TABLET (10 MG TOTAL) BY MOUTH AS NEEDED. MAY REPEAT IN 2 HOURS IF NEEDED 06/08/2022: PRN   SUMAtriptan 6 MG/0.5ML SOAJ Inject 6mg  at onset of migraine.  May repeat in 2 hrs, if needed.  Emma Mathews dose: 2 injections/day. This is a 30 day prescription.     tirzepatide [facility]) 5 MG/0.5ML Pen Inject 5 mg into the skin once a week.    Vibegron (GEMTESA) 75 MG TABS Take 1 tablet (75 mg total) by mouth daily. (Patient not taking: Reported on 07/28/2023)    No facility-administered encounter medications on file as of 07/28/2023.     Past Medical History:  Diagnosis Date   Anemia    Anxiety    Appendicitis, acute 10/02/2013   Cancer (HCC)    melanoma excision in 2013   Cholecystitis    Complication of anesthesia    slow to wake up   Depression    Encounter for lipid screening for cardiovascular disease 01/13/2023   Headache    migraine   Mammographic breast lesion 06/28/2017   S/p biopsy, benign    Plantar fasciitis, left 06/05/2019   Pneumonia 2010   Polycythemia, secondary 05/11/2023   PONV (postoperative nausea and vomiting)    Seasonal allergies    STRESS FRACTURE, FOOT 04/08/2009   Qualifier: Diagnosis of  By: Lovell Sheehan MD, Balinda Quails    Vision abnormalities    s/p Lasik    Past Surgical History:  Procedure Laterality Date   CHOLECYSTECTOMY     EXTRACORPOREAL SHOCK WAVE LITHOTRIPSY Right 04/18/2023   Procedure: RIGHT EXTRACORPOREAL SHOCK WAVE LITHOTRIPSY (ESWL);  Surgeon: Jannifer Hick, MD;  Location: [facility];  Service: Urology;  Laterality: Right;   EYE SURGERY Bilateral    PRK   Labrum Repair Right 11/15/1998   Open   LAPAROSCOPIC APPENDECTOMY N/A 09/02/2013   Procedure: APPENDECTOMY LAPAROSCOPIC;  Surgeon: Shelly Rubenstein, MD;  Location: [facility] OR;  Service: General;  Laterality: N/A;   SHOULDER ARTHROSCOPY Left 11/16/2003   bone spurs    SHOULDER ARTHROSCOPY Right 11/01/2017   Procedure: RIGHT SHOULDER ARTHROSCOPY WITH SUBACROMIAL DECOMPRESSION;  Surgeon: Cammy Copa, MD;  Location: [facility] OR;  Service: Orthopedics;  Laterality: Right;   SHOULDER ARTHROSCOPY WITH SUBACROMIAL DECOMPRESSION, ROTATOR CUFF REPAIR AND BICEP TENDON REPAIR Right 02/24/2016   Procedure: RIGHT SHOULDER ARTHROSCOPY WITH  DEBRIDEMENT, BICEPS TENODESIS;  Surgeon: Cammy Copa, MD;  Location: [facility] OR;  Service: Orthopedics;  Laterality: Right;   SHOULDER SURGERY Right 11/15/2005   Revision and repair flap   WISDOM TOOTH EXTRACTION     WRIST ARTHROSCOPY Right 11/09/2017   Procedure: ARTHROSCOPY WRIST WITH DEBRIDEMENT;  Surgeon: Dairl Ponder, MD;  Location: [facility];  Service: Orthopedics;  Laterality: Right;    Family History  Adopted: Yes  Family history unknown: Yes    Social History   Social History Narrative   Patient lives with husband and one child, a son named Emma Mathews.    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      Review of Systems General: Denies fevers, chills, weight loss CV: Denies chest pain, shortness of breath, palpitations Skin: Multiple skin lesions including 3 on her face 1 to the left of her nose and 2 on the left upper lip  Physical Exam    07/28/2023    2:58 PM 07/28/2023   10:41 AM 07/28/2023    9:30 AM  Vitals with BMI  Height 5\' 6"     Weight 189 lbs    BMI 30.52    Systolic 123 130 161  Diastolic 78 67 68  Pulse 79 87 75    General:  No acute distress,  Alert and oriented, Non-Toxic, Normal speech and affect Integument: 3 skin lesions 1 approximately 3 mm in size in the crease of the left nare, 2 each approximately 2 mm on the left lateral aspect of the upper lip.  Both of these lesions are at the white line. Mammogram: Not applicable Assessment/Plan Skin lesions: Will plan to remove the skin lesions in the office under local anesthetic.  Discussed with the patient that they are in cosmetically sensitive areas and she will likely have small but manageable scars.  She understands that I will closed the incisions with a nonabsorbable suture which will need to be removed 5 to 7 days postoperatively.  If the lesions return with concerning pathology she may require more extensive surgery.  Will schedule for excision at  her request  Emma Mathews 07/28/2023, 4:19 PM

## 2023-08-03 ENCOUNTER — Other Ambulatory Visit (HOSPITAL_COMMUNITY): Payer: Self-pay

## 2023-08-03 DIAGNOSIS — N3281 Overactive bladder: Secondary | ICD-10-CM | POA: Diagnosis not present

## 2023-08-03 DIAGNOSIS — R3915 Urgency of urination: Secondary | ICD-10-CM | POA: Diagnosis not present

## 2023-08-03 DIAGNOSIS — N200 Calculus of kidney: Secondary | ICD-10-CM | POA: Diagnosis not present

## 2023-08-03 MED ORDER — GEMTESA 75 MG PO TABS
75.0000 mg | ORAL_TABLET | Freq: Every day | ORAL | 11 refills | Status: DC
  Filled 2023-08-03: qty 30, 30d supply, fill #0

## 2023-08-15 ENCOUNTER — Other Ambulatory Visit (HOSPITAL_COMMUNITY): Payer: Self-pay

## 2023-08-24 ENCOUNTER — Encounter: Payer: Self-pay | Admitting: Plastic Surgery

## 2023-08-24 ENCOUNTER — Ambulatory Visit: Payer: 59 | Admitting: Plastic Surgery

## 2023-08-24 VITALS — BP 104/73 | HR 84

## 2023-08-24 DIAGNOSIS — D2239 Melanocytic nevi of other parts of face: Secondary | ICD-10-CM

## 2023-08-24 DIAGNOSIS — D220 Melanocytic nevi of lip: Secondary | ICD-10-CM

## 2023-08-24 DIAGNOSIS — L989 Disorder of the skin and subcutaneous tissue, unspecified: Secondary | ICD-10-CM

## 2023-08-24 NOTE — Progress Notes (Signed)
Procedure Note  Preoperative Dx: Multiple facial nevi  Postoperative Dx: Same  Procedure: Excision of 3 facial nevi 1 at the left nare and to on the left upper lip  Anesthesia: Lidocaine 1% with 1:100,000 epinephrine and 0.25% Sensorcaine   Indication for Procedure: Removal for pathologic diagnosis  Description of Procedure: Risks and complications were explained to the patient including scarring and the need for additional procedures based on pathology.  Consent was confirmed and the patient understands the risks and benefits.  The potential complications and alternatives were explained and the patient consents.  The patient expressed understanding the option of not having the procedure and the risks of a scar.  Time out was called and all information was confirmed to be correct.    The area was prepped and drapped.  Local anesthetic was injected in the subcutaneous tissues.  After waiting for the local to take affect the 3 lesions were excised sharply.  The first lesion was the lesion on the left lateral border of the upper lip.  The lesion measured approximately 2 mm and was excised with a 2 mm incision.  The lesion at the lateral border of the left nare was excised next.  This lesion measured approximately 3 mm and was excised with a 3 mm incision.  The medial left upper lip lesion was excised last.  This lesion measured approximately 2 mm and was excised with a 2 mm incision.  After obtaining hemostasis, the surgical wounds were closed with interrupted 5-0 Prolene sutures.  The surgical wound measured 7 mm in total length.  A dressing was applied.  The patient was given instructions on how to care for the area and a follow up appointment.  Emma Mathews tolerated the procedure well and there were no complications. The specimen was sent to pathology.

## 2023-08-26 ENCOUNTER — Other Ambulatory Visit (HOSPITAL_COMMUNITY): Payer: Self-pay

## 2023-08-29 ENCOUNTER — Ambulatory Visit: Payer: 59 | Admitting: Student

## 2023-08-29 DIAGNOSIS — D220 Melanocytic nevi of lip: Secondary | ICD-10-CM

## 2023-08-29 DIAGNOSIS — L989 Disorder of the skin and subcutaneous tissue, unspecified: Secondary | ICD-10-CM

## 2023-08-29 LAB — DERMATOLOGY PATHOLOGY

## 2023-08-29 NOTE — Progress Notes (Signed)
Patient is a 47 year old female with history of multiple facial nevi.  She underwent excision of 3 facial nevi, one at the left nare, and 2 on the left upper lip.  During the procedure, the lesions were excised and the surgical wounds were closed with 5-0 Prolene sutures.  The specimen was sent to pathology.  Pathology has not yet resulted.  Patient presents to the clinic today for postprocedural follow-up.  Today, patient reports she is doing well.  She denies any issues with the surgical sites.  She denies any drainage, fevers or chills.  Discussed with patient that pathology has not yet resulted.  Discussed with her that we will call her with the results once that has resulted.  Patient expressed understanding.  Chaperone present on exam.  On exam, patient is sitting upright in no acute distress.  Incisions are intact with Prolene sutures.  There is a little bit of surrounding irritation, especially to the lip incisions.  No signs of infection on exam.  Prolene sutures were removed without difficulty.  Patient tolerated well.  Recommended that patient apply Vaseline to her incisions daily for the next week or so, and then transition to a silicone-based scar cream such as Mederma with silicone, Skinuva or Silagen.  Discussed with patient the importance of not exposing the scars to direct sunlight as this can worsen the scar.  Recommended she cover the area or apply sunscreen when out in the sun.  Patient expressed understanding.  Patient to follow back up as needed.  Instructed her to call if she has any questions or concerns about anything.

## 2023-09-05 ENCOUNTER — Telehealth: Payer: Self-pay | Admitting: Student

## 2023-09-05 NOTE — Telephone Encounter (Signed)
I called the patient in regards to the results of her pathology.  Discussed with her all of the lesions were consistent with melanocytic nevi and there was no atypia.  Discussed with the patient to continue to monitor her procedure sites and call us back if she needs anything.

## 2023-09-14 ENCOUNTER — Other Ambulatory Visit (HOSPITAL_BASED_OUTPATIENT_CLINIC_OR_DEPARTMENT_OTHER): Payer: Self-pay

## 2023-09-15 ENCOUNTER — Other Ambulatory Visit (HOSPITAL_COMMUNITY): Payer: 59 | Attending: Oncology

## 2023-09-23 DIAGNOSIS — Z719 Counseling, unspecified: Secondary | ICD-10-CM

## 2023-09-26 ENCOUNTER — Other Ambulatory Visit: Payer: Self-pay

## 2023-09-26 DIAGNOSIS — D751 Secondary polycythemia: Secondary | ICD-10-CM

## 2023-09-27 ENCOUNTER — Inpatient Hospital Stay: Payer: 59 | Attending: Hematology and Oncology

## 2023-09-27 ENCOUNTER — Inpatient Hospital Stay: Payer: 59

## 2023-09-27 VITALS — BP 124/70 | HR 71 | Temp 98.9°F | Resp 13

## 2023-09-27 DIAGNOSIS — D751 Secondary polycythemia: Secondary | ICD-10-CM

## 2023-09-27 DIAGNOSIS — Z79899 Other long term (current) drug therapy: Secondary | ICD-10-CM | POA: Diagnosis not present

## 2023-09-27 LAB — CBC WITH DIFFERENTIAL (CANCER CENTER ONLY)
Abs Immature Granulocytes: 0.02 10*3/uL (ref 0.00–0.07)
Basophils Absolute: 0 10*3/uL (ref 0.0–0.1)
Basophils Relative: 1 %
Eosinophils Absolute: 0.5 10*3/uL (ref 0.0–0.5)
Eosinophils Relative: 8 %
HCT: 42.7 % (ref 36.0–46.0)
Hemoglobin: 15.2 g/dL — ABNORMAL HIGH (ref 12.0–15.0)
Immature Granulocytes: 0 %
Lymphocytes Relative: 33 %
Lymphs Abs: 2.1 10*3/uL (ref 0.7–4.0)
MCH: 30 pg (ref 26.0–34.0)
MCHC: 35.6 g/dL (ref 30.0–36.0)
MCV: 84.2 fL (ref 80.0–100.0)
Monocytes Absolute: 0.5 10*3/uL (ref 0.1–1.0)
Monocytes Relative: 7 %
Neutro Abs: 3.3 10*3/uL (ref 1.7–7.7)
Neutrophils Relative %: 51 %
Platelet Count: 229 10*3/uL (ref 150–400)
RBC: 5.07 MIL/uL (ref 3.87–5.11)
RDW: 12.9 % (ref 11.5–15.5)
WBC Count: 6.4 10*3/uL (ref 4.0–10.5)
nRBC: 0 % (ref 0.0–0.2)

## 2023-09-27 NOTE — Progress Notes (Signed)
Emma Mathews presents today for phlebotomy per MD orders. Phlebotomy procedure started at 1445 and ended at 1455. 512 grams removed. Patient declined post procedure observation. Patient tolerated procedure well. IV needle removed intact.  Ambulated to lobby.

## 2023-09-27 NOTE — Patient Instructions (Signed)

## 2023-11-15 DIAGNOSIS — N83202 Unspecified ovarian cyst, left side: Secondary | ICD-10-CM | POA: Diagnosis not present

## 2023-11-15 DIAGNOSIS — Z01419 Encounter for gynecological examination (general) (routine) without abnormal findings: Secondary | ICD-10-CM | POA: Diagnosis not present

## 2023-11-15 DIAGNOSIS — Z1231 Encounter for screening mammogram for malignant neoplasm of breast: Secondary | ICD-10-CM | POA: Diagnosis not present

## 2023-11-15 DIAGNOSIS — Z30431 Encounter for routine checking of intrauterine contraceptive device: Secondary | ICD-10-CM | POA: Diagnosis not present

## 2023-11-17 ENCOUNTER — Other Ambulatory Visit: Payer: Self-pay | Admitting: Obstetrics and Gynecology

## 2023-11-17 DIAGNOSIS — N83209 Unspecified ovarian cyst, unspecified side: Secondary | ICD-10-CM

## 2023-11-18 DIAGNOSIS — Z30432 Encounter for removal of intrauterine contraceptive device: Secondary | ICD-10-CM | POA: Diagnosis not present

## 2023-11-18 DIAGNOSIS — Z30430 Encounter for insertion of intrauterine contraceptive device: Secondary | ICD-10-CM | POA: Diagnosis not present

## 2023-11-18 DIAGNOSIS — N912 Amenorrhea, unspecified: Secondary | ICD-10-CM | POA: Diagnosis not present

## 2023-11-18 DIAGNOSIS — Z113 Encounter for screening for infections with a predominantly sexual mode of transmission: Secondary | ICD-10-CM | POA: Diagnosis not present

## 2023-11-18 DIAGNOSIS — N83292 Other ovarian cyst, left side: Secondary | ICD-10-CM | POA: Diagnosis not present

## 2023-11-21 DIAGNOSIS — N912 Amenorrhea, unspecified: Secondary | ICD-10-CM | POA: Diagnosis not present

## 2023-11-25 ENCOUNTER — Other Ambulatory Visit: Payer: Self-pay | Admitting: *Deleted

## 2023-11-25 DIAGNOSIS — D751 Secondary polycythemia: Secondary | ICD-10-CM

## 2023-11-28 ENCOUNTER — Inpatient Hospital Stay: Payer: 59

## 2023-11-28 ENCOUNTER — Inpatient Hospital Stay: Payer: 59 | Attending: Hematology and Oncology

## 2023-11-28 ENCOUNTER — Inpatient Hospital Stay (HOSPITAL_BASED_OUTPATIENT_CLINIC_OR_DEPARTMENT_OTHER): Payer: 59 | Admitting: Hematology and Oncology

## 2023-11-28 ENCOUNTER — Inpatient Hospital Stay: Payer: 59 | Admitting: Hematology and Oncology

## 2023-11-28 VITALS — BP 125/50 | HR 69 | Temp 98.2°F | Resp 18 | Ht 66.0 in | Wt 170.9 lb

## 2023-11-28 DIAGNOSIS — Z7951 Long term (current) use of inhaled steroids: Secondary | ICD-10-CM | POA: Diagnosis not present

## 2023-11-28 DIAGNOSIS — R634 Abnormal weight loss: Secondary | ICD-10-CM | POA: Diagnosis not present

## 2023-11-28 DIAGNOSIS — L299 Pruritus, unspecified: Secondary | ICD-10-CM | POA: Diagnosis not present

## 2023-11-28 DIAGNOSIS — Z885 Allergy status to narcotic agent status: Secondary | ICD-10-CM | POA: Insufficient documentation

## 2023-11-28 DIAGNOSIS — N83209 Unspecified ovarian cyst, unspecified side: Secondary | ICD-10-CM | POA: Diagnosis not present

## 2023-11-28 DIAGNOSIS — Z91041 Radiographic dye allergy status: Secondary | ICD-10-CM | POA: Insufficient documentation

## 2023-11-28 DIAGNOSIS — D751 Secondary polycythemia: Secondary | ICD-10-CM | POA: Diagnosis not present

## 2023-11-28 DIAGNOSIS — Z79899 Other long term (current) drug therapy: Secondary | ICD-10-CM | POA: Insufficient documentation

## 2023-11-28 LAB — CBC WITH DIFFERENTIAL (CANCER CENTER ONLY)
Abs Immature Granulocytes: 0.03 10*3/uL (ref 0.00–0.07)
Basophils Absolute: 0 10*3/uL (ref 0.0–0.1)
Basophils Relative: 1 %
Eosinophils Absolute: 0.4 10*3/uL (ref 0.0–0.5)
Eosinophils Relative: 6 %
HCT: 41.1 % (ref 36.0–46.0)
Hemoglobin: 14.1 g/dL (ref 12.0–15.0)
Immature Granulocytes: 1 %
Lymphocytes Relative: 29 %
Lymphs Abs: 1.7 10*3/uL (ref 0.7–4.0)
MCH: 28.1 pg (ref 26.0–34.0)
MCHC: 34.3 g/dL (ref 30.0–36.0)
MCV: 82 fL (ref 80.0–100.0)
Monocytes Absolute: 0.4 10*3/uL (ref 0.1–1.0)
Monocytes Relative: 6 %
Neutro Abs: 3.5 10*3/uL (ref 1.7–7.7)
Neutrophils Relative %: 57 %
Platelet Count: 245 10*3/uL (ref 150–400)
RBC: 5.01 MIL/uL (ref 3.87–5.11)
RDW: 13 % (ref 11.5–15.5)
WBC Count: 6.1 10*3/uL (ref 4.0–10.5)
nRBC: 0 % (ref 0.0–0.2)

## 2023-11-28 NOTE — Progress Notes (Signed)
 Patient Care Team: Emma Lauraine BRAVO, NP as PCP - General (Nurse Practitioner) Ob/Gyn, [facility] as Consulting Physician (Obstetrics and Gynecology)  DIAGNOSIS:  Encounter Diagnosis  Name Primary?   Polycythemia, secondary Yes    CHIEF COMPLIANT: Follow-up of secondary polycythemia after 2 phlebotomy treatments  HISTORY OF PRESENT ILLNESS:  History of Present Illness   The patient, with a history of pruritus managed with hydroxyzine  and topical lotions, presents for a follow-up after three sessions of blood removal treatment. She reports significant improvement, with no itching since the last session. She no longer requires hydroxyzine  or topical lotions, describing the change as 'night and day.'  In addition to the improvement in pruritus, the patient has lost seventy pounds since March due to dietary changes and increased physical activity, prompted by an elevated A1c. She was prescribed 2.5mg  of Mounjaro , cut out fried and processed foods, and only drinks water . 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  The patient also reports recent discovery of two large cysts on her ovaries, one 8cm and the other 4.5cm. She is scheduled for an MRI to further evaluate these cysts. She is not menopausal and has an IUD.         ALLERGIES:  is allergic to diphenhydramine  hcl, diphenhydramine  hcl, iodinated contrast media, chlorhexidine  gluconate, and codeine.  MEDICATIONS:  Current Outpatient Medications  Medication Sig Dispense Refill   levonorgestrel  (MIRENA ) 20 MCG/24HR IUD Mirena  20 mcg/24 hours (5 yrs) 52 mg intrauterine device     mirabegron  ER (MYRBETRIQ ) 50 MG TB24 tablet Take 1 tablet (50 mg total) by mouth daily. 90 tablet 3   mometasone  (NASONEX ) 50 MCG/ACT nasal spray Nasonex      Multiple Vitamins-Minerals (MULTIPLE VITAMINS/WOMENS PO) Multiple Vitamin, Womens     pantoprazole  (PROTONIX ) 40 MG tablet Take 1 tablet (40 mg total) by mouth daily. Take 30 minutes  before breakfast. 90 tablet 1   rizatriptan  (MAXALT -MLT) 10 MG disintegrating tablet DISSOLVE 1 TABLET (10 MG TOTAL) BY MOUTH AS NEEDED. MAY REPEAT IN 2 HOURS IF NEEDED 15 tablet 0   SUMAtriptan  6 MG/0.5ML SOAJ Inject 6mg  at onset of migraine.  May repeat in 2 hrs, if needed.  Max dose: 2 injections/day. This is a 30 day prescription. 10 Cartridge 5   tirzepatide  (MOUNJARO ) 5 MG/0.5ML Pen Inject 5 mg into the skin once a week. 2 mL 1   Vibegron  (GEMTESA ) 75 MG TABS Take 1 tablet (75 mg total) by mouth daily. 30 tablet 11   Vibegron  (GEMTESA ) 75 MG TABS Take 1 tablet (75 mg total) by mouth daily. 30 tablet 11   No current facility-administered medications for this visit.    PHYSICAL EXAMINATION: ECOG PERFORMANCE STATUS: 1 - Symptomatic but completely ambulatory  Vitals:   11/28/23 1353  BP: (!) 125/50  Pulse: 69  Resp: 18  Temp: 98.2 F (36.8 C)  SpO2: 100%   Filed Weights   11/28/23 1353  Weight: 170 lb 14.4 oz (77.5 kg)      LABORATORY DATA:  I have reviewed the data as listed    Latest Ref Rng & Units 07/08/2023    8:52 AM 04/20/2023    7:46 AM 04/09/2023   11:40 AM  CMP  Glucose 70 - 99 mg/dL 85  96  85   BUN 6 - 23 mg/dL 14  12  14    Creatinine 0.40 - 1.20 mg/dL 9.18  9.09  9.27   Sodium 135 - 145 mEq/L 140  139  136  Potassium 3.5 - 5.1 mEq/L 4.0  3.7  4.5   Chloride 96 - 112 mEq/L 106  106  103   CO2 19 - 32 mEq/L 26  24  25    Calcium 8.4 - 10.5 mg/dL 9.6  9.5  9.8   Total Protein 6.0 - 8.3 g/dL 7.8  8.4    Total Bilirubin 0.2 - 1.2 mg/dL 0.8  0.7    Alkaline Phos 39 - 117 U/L 70  64    AST 0 - 37 U/L 14  20    ALT 0 - 35 U/L 10  19      Lab Results  Component Value Date   WBC 6.1 11/28/2023   HGB 14.1 11/28/2023   HCT 41.1 11/28/2023   MCV 82.0 11/28/2023   PLT 245 11/28/2023   NEUTROABS 3.5 11/28/2023    ASSESSMENT & PLAN:  Polycythemia, secondary Profound itching of the extremities with exposure to heat (especially hot water  or hot weather)   Lab  review: 01/13/2023: Hemoglobin 15.7, hematocrit 46.2 05/03/2023: Hemoglobin 15.5, hematocrit 46.6 05/17/2023: MPN panel: Normal, erythropoietin  6.4, hemoglobin 15.6   Phlebotomy: Started 05/17/2023 (every 2 months)   MPN panel as well as erythropoietin  are both normal. Itching resolved with phlebotomy. Patient is unclear if the resolution of itching was due to phlebotomy versus 70 pound weight loss (since starting Mounjaro ) ------------------------------------- Assessment and Plan    Polycythemia Improvement in pruritus after phlebotomies. Hematocrit at 14.1 today, down from 15 two months ago. Discussed the possibility of discontinuing regular phlebotomies given the current hematocrit level and absence of pruritus. -Discontinue regular phlebotomies. -Schedule follow-up in two months to reassess hematocrit level.  Weight Loss Significant weight loss (70 lbs) since March, possibly contributing to improvement in systemic inflammation and pruritus. Currently on 2.5mg  of Mounjaro  for elevated A1c, with dietary changes and exercise contributing to weight loss. -Continue current weight loss regimen.  Ovarian Cysts Two large ovarian cysts identified, one 8cm and the other 4.5cm.  Follows with GYN     34-month follow-up with labs and MD visit to see if she were to need any further phlebotomies in the future.    Orders Placed This Encounter  Procedures   CBC with Differential (Cancer Center Only)    Standing Status:   Future    Expiration Date:   11/27/2024   The patient has a good understanding of the overall plan. she agrees with it. she will call with any problems that may develop before the next visit here. Total time spent: 30 mins including face to face time and time spent for planning, charting and co-ordination of care   [name], MD 11/28/23

## 2023-11-28 NOTE — Assessment & Plan Note (Signed)
 Profound itching of the extremities with exposure to heat (especially hot water  or hot weather)   Lab review: 01/13/2023: Hemoglobin 15.7, hematocrit 46.2 05/03/2023: Hemoglobin 15.5, hematocrit 46.6 05/17/2023: MPN panel: Normal, erythropoietin  6.4, hemoglobin 15.6   Phlebotomy: Started 05/17/2023 (every 2 months)   I discussed with the patient the results of the MPN panel as well as erythropoietin  which are both normal. Itching resolved with phlebotomy.   Q 2 month Phlebotomy (as needed for itching): Please do phlebotomy without checking blood because we are doing phlebotomy not necessarily for the elevated hemoglobin but for her symptoms of itching   6 months Lab and follow up with me along with phlebotomy

## 2023-11-30 ENCOUNTER — Ambulatory Visit
Admission: RE | Admit: 2023-11-30 | Discharge: 2023-11-30 | Disposition: A | Discharge status: Home / Self Care | Payer: 59 | Source: Ambulatory Visit | Attending: Obstetrics and Gynecology | Admitting: Obstetrics and Gynecology

## 2023-11-30 DIAGNOSIS — N83209 Unspecified ovarian cyst, unspecified side: Secondary | ICD-10-CM

## 2023-11-30 DIAGNOSIS — N83292 Other ovarian cyst, left side: Secondary | ICD-10-CM | POA: Diagnosis not present

## 2023-11-30 DIAGNOSIS — Z975 Presence of (intrauterine) contraceptive device: Secondary | ICD-10-CM | POA: Diagnosis not present

## 2023-12-07 ENCOUNTER — Telehealth: Payer: Self-pay | Admitting: *Deleted

## 2023-12-07 NOTE — Telephone Encounter (Signed)
Spoke with the patient regarding the referral to GYN oncology. Patient scheduled as new patient with Dr Alvester Morin on 1/27 at 9 am. Patient given an arrival time of 8:30 am.  Explained to the patient the the doctor will perform a pelvic exam at this visit. Patient given the policy that only one visitor allowed and that visitor must be over 16 yrs are allowed in the Cancer Center. Patient given the address/phone number for the clinic and that the center offers free valet service. Patient aware that masks required.

## 2023-12-09 ENCOUNTER — Encounter: Payer: Self-pay | Admitting: Psychiatry

## 2023-12-12 ENCOUNTER — Other Ambulatory Visit (HOSPITAL_COMMUNITY): Payer: Self-pay

## 2023-12-12 ENCOUNTER — Inpatient Hospital Stay: Payer: 59 | Admitting: Psychiatry

## 2023-12-12 ENCOUNTER — Inpatient Hospital Stay: Payer: 59

## 2023-12-12 ENCOUNTER — Other Ambulatory Visit: Payer: Self-pay | Admitting: Gynecologic Oncology

## 2023-12-12 ENCOUNTER — Inpatient Hospital Stay (HOSPITAL_BASED_OUTPATIENT_CLINIC_OR_DEPARTMENT_OTHER): Payer: 59 | Admitting: Gynecologic Oncology

## 2023-12-12 ENCOUNTER — Encounter: Payer: Self-pay | Admitting: Psychiatry

## 2023-12-12 VITALS — BP 124/78 | HR 75 | Temp 98.6°F | Resp 20 | Ht 66.0 in | Wt 168.4 lb

## 2023-12-12 DIAGNOSIS — R1900 Intra-abdominal and pelvic swelling, mass and lump, unspecified site: Secondary | ICD-10-CM

## 2023-12-12 DIAGNOSIS — D751 Secondary polycythemia: Secondary | ICD-10-CM | POA: Diagnosis not present

## 2023-12-12 LAB — CEA (ACCESS): CEA (CHCC): 1.4 ng/mL (ref 0.00–5.00)

## 2023-12-12 MED ORDER — ONDANSETRON HCL 4 MG PO TABS
4.0000 mg | ORAL_TABLET | Freq: Three times a day (TID) | ORAL | 0 refills | Status: DC | PRN
  Filled 2023-12-12: qty 20, 7d supply, fill #0

## 2023-12-12 MED ORDER — OXYCODONE HCL 5 MG PO TABS
5.0000 mg | ORAL_TABLET | ORAL | 0 refills | Status: DC | PRN
  Filled 2023-12-12: qty 15, 3d supply, fill #0

## 2023-12-12 NOTE — Patient Instructions (Addendum)
Preparing for your Surgery  Plan for surgery on December 20, 2023 with Dr. Clide Cliff at [facility]. You will be scheduled for robotic assisted laparoscopic unilateral oophorectomy (removal of one ovary), bilateral salpingectomy (removal of both fallopian tubes), possible robotic assisted total hysterectomy (removal of the uterus and cervix), possible staging, IUD removal if no hysterectomy at the time of surgery.  You will need to hold your mounjaro 7 days before surgery.   We will check lab work today and you will be notified of the results.   Pre-operative Testing -You will receive a phone call from presurgical testing at [facility] to arrange for a pre-operative appointment and lab work.  -Bring your insurance card, copy of an advanced directive if applicable, medication list  -At that visit, you will be asked to sign a consent for a possible blood transfusion in case a transfusion becomes necessary during surgery.  The need for a blood transfusion is rare but having consent is a necessary part of your care.     -You should not be taking blood thinners or aspirin at least ten days prior to surgery unless instructed by your surgeon.  -Do not take supplements such as fish oil (omega 3), red yeast rice, turmeric before your surgery. STOP TAKING AT LEAST 10 DAYS BEFORE SURGERY. You want to avoid medications with aspirin in them including headache powders such as BC or Goody's), Excedrin migraine.  Day Before Surgery at Home -You will be asked to take in a light diet the day before surgery. You will be advised you can have clear liquids up until 3 hours before your surgery.    Eat a light diet the day before surgery.  Examples including soups, broths, toast, yogurt, mashed potatoes.  AVOID GAS PRODUCING FOODS AND BEVERAGES. Things to avoid include carbonated beverages (fizzy beverages, sodas), raw fruits and raw vegetables (uncooked), or beans.   If your bowels are  filled with gas, your surgeon will have difficulty visualizing your pelvic organs which increases your surgical risks.  Your role in recovery Your role is to become active as soon as directed by your doctor, while still giving yourself time to heal.  Rest when you feel tired. You will be asked to do the following in order to speed your recovery:  - Cough and breathe deeply. This helps to clear and expand your lungs and can prevent pneumonia after surgery.  - STAY ACTIVE WHEN YOU GET HOME. Do mild physical activity. Walking or moving your legs help your circulation and body functions return to normal. Do not try to get up or walk alone the first time after surgery.   -If you develop swelling on one leg or the other, pain in the back of your leg, redness/warmth in one of your legs, please call the office or go to the Emergency Room to have a doppler to rule out a blood clot. For shortness of breath, chest pain-seek care in the Emergency Room as soon as possible. - Actively manage your pain. Managing your pain lets you move in comfort. We will ask you to rate your pain on a scale of zero to 10. It is your responsibility to tell your doctor or nurse where and how much you hurt so your pain can be treated.  Special Considerations -If you are diabetic, you may be placed on insulin after surgery to have closer control over your blood sugars to promote healing and recovery.  This does not mean that you  will be discharged on insulin.  If applicable, your oral antidiabetics will be resumed when you are tolerating a solid diet.  -Your final pathology results from surgery should be available around one week after surgery and the results will be relayed to you when available.  -Dr. Antionette Char is the surgeon that assists your GYN Oncologist with surgery.  If you end up staying the night, the next day after your surgery you will either see Dr. Pricilla Holm, Dr. Alvester Morin, or Dr. Antionette Char.  -FMLA forms can  be faxed to [phone] and please allow 5-7 business days for completion.  Pain Management After Surgery -You will be prescribed your pain medication and bowel regimen medications before surgery so that you can have these available when you are discharged from the hospital. The pain medication is for use ONLY AFTER surgery and a new prescription will not be given.   -Make sure that you have Tylenol and Ibuprofen IF YOU ARE ABLE TO TAKE THESE MEDICATIONS at home to use on a regular basis after surgery for pain control. We recommend alternating the medications every hour to six hours since they work differently and are processed in the body differently for pain relief.  -Review the attached handout on narcotic use and their risks and side effects.   Bowel Regimen -You will be prescribed Sennakot-S to take nightly to prevent constipation especially if you are taking the narcotic pain medication intermittently.  It is important to prevent constipation and drink adequate amounts of liquids. You can stop taking this medication when you are not taking pain medication and you are back on your normal bowel routine.  Risks of Surgery Risks of surgery are low but include bleeding, infection, damage to surrounding structures, re-operation, blood clots, and very rarely death.   Blood Transfusion Information (For the consent to be signed before surgery)  We will be checking your blood type before surgery so in case of emergencies, we will know what type of blood you would need.                                            WHAT IS A BLOOD TRANSFUSION?  A transfusion is the replacement of blood or some of its parts. Blood is made up of multiple cells which provide different functions. Red blood cells carry oxygen and are used for blood loss replacement. White blood cells fight against infection. Platelets control bleeding. Plasma helps clot blood. Other blood products are available for specialized needs,  such as hemophilia or other clotting disorders. BEFORE THE TRANSFUSION  Who gives blood for transfusions?  You may be able to donate blood to be used at a later date on yourself (autologous donation). Relatives can be asked to donate blood. This is generally not any safer than if you have received blood from a stranger. The same precautions are taken to ensure safety when a relative's blood is donated. Healthy volunteers who are fully evaluated to make sure their blood is safe. This is blood bank blood. Transfusion therapy is the safest it has ever been in the practice of medicine. Before blood is taken from a donor, a complete history is taken to make sure that person has no history of diseases nor engages in risky social behavior (examples are intravenous drug use or sexual activity with multiple partners). The donor's travel history is screened to minimize risk  of transmitting infections, such as malaria. The donated blood is tested for signs of infectious diseases, such as HIV and hepatitis. The blood is then tested to be sure it is compatible with you in order to minimize the chance of a transfusion reaction. If you or a relative donates blood, this is often done in anticipation of surgery and is not appropriate for emergency situations. It takes many days to process the donated blood. RISKS AND COMPLICATIONS Although transfusion therapy is very safe and saves many lives, the main dangers of transfusion include:  Getting an infectious disease. Developing a transfusion reaction. This is an allergic reaction to something in the blood you were given. Every precaution is taken to prevent this. The decision to have a blood transfusion has been considered carefully by your caregiver before blood is given. Blood is not given unless the benefits outweigh the risks.  AFTER SURGERY INSTRUCTIONS  Return to work: 4-6 weeks if applicable  Activity: 1. Be up and out of the bed during the day.  Take a nap if  needed.  You may walk up steps but be careful and use the hand rail.  Stair climbing will tire you more than you think, you may need to stop part way and rest.   2. No lifting or straining for 6 weeks over 10 pounds. No pushing, pulling, straining for 6 weeks.  3. No driving for 3-47 days when the following criteria have been met: Do not drive if you are taking narcotic pain medicine and make sure that your reaction time has returned.   4. You can shower as soon as the next day after surgery. Shower daily.  Use your regular soap and water (not directly on the incision) and pat your incision(s) dry afterwards; don't rub.  No tub baths or submerging your body in water until cleared by your surgeon. If you have the soap that was given to you by pre-surgical testing that was used before surgery, you do not need to use it afterwards because this can irritate your incisions.   5. No sexual activity and nothing in the vagina for 6 weeks if no hysterectomy, 12 weeks if you have a hysterectomy (removal of the uterus and cervix).  6. You may experience a small amount of clear drainage from your incisions, which is normal.  If the drainage persists, increases, or changes color please call the office.  7. Do not use creams, lotions, or ointments such as neosporin on your incisions after surgery until advised by your surgeon because they can cause removal of the dermabond glue on your incisions.    8. You may experience vaginal spotting after surgery or when the stitches at the top of the vagina begin to dissolve (if you have a hysterectomy).  The spotting is normal but if you experience heavy bleeding, call our office.  9. Take Tylenol or ibuprofen first for pain if you are able to take these medications and only use narcotic pain medication for severe pain not relieved by the Tylenol or Ibuprofen.  Monitor your Tylenol intake to a max of 4,000 mg in a 24 hour period. You can alternate these medications after  surgery.  Diet: 1. Low sodium Heart Healthy Diet is recommended but you are cleared to resume your normal (before surgery) diet after your procedure.  2. It is safe to use a laxative, such as Miralax or Colace, if you have difficulty moving your bowels before surgery. You have been prescribed Sennakot-S to take  at bedtime every evening after surgery to keep bowel movements regular and to prevent constipation.    Wound Care: 1. Keep clean and dry.  Shower daily.  Reasons to call the Doctor: Fever - Oral temperature greater than 100.4 degrees Fahrenheit Foul-smelling vaginal discharge Difficulty urinating Nausea and vomiting Increased pain at the site of the incision that is unrelieved with pain medicine. Difficulty breathing with or without chest pain New calf pain especially if only on one side Sudden, continuing increased vaginal bleeding with or without clots.   Contacts: For questions or concerns you should contact:  Dr. Clide Cliff at [phone]  Warner Mccreedy, NP at [phone]  After Hours: call [phone] and have the GYN Oncologist paged/contacted (after 5 pm or on the weekends). You will speak with an after hours RN and let he or she know you have had surgery.  Messages sent via mychart are for non-urgent matters and are not responded to after hours so for urgent needs, please call the after hours number.

## 2023-12-12 NOTE — Progress Notes (Signed)
GYNECOLOGIC ONCOLOGY NEW PATIENT CONSULTATION  Date of Service: 12/12/2023 Referring Provider: Osborn Coho, MD   ASSESSMENT AND PLAN: Emma Mathews is a 48 y.o. woman with 9.2 cm complex cystic mass, normal CA125.  We reviewed that the exact etiology of the pelvic mass is unclear, but could include a benign, borderline, or malignant process.  The recommended treatment is surgical excision to make a definitive diagnosis. Because the mass is relatively small, we feel that a minimally invasive approach is feasible, using robotic assistance.   In the event of malignancy or borderline tumor on frozen section, we will perform indicated staging procedures. We discussed that these procedures may include hysterectomy, contralateral salpingo-oophorectomy, omentectomy pelvic and/or para-aortic lymphadenectomy, peritoneal biopsies. We would also remove any tissue concerning for metastatic disease which could require additional procedures including bowel surgery.  Patient was consented for: Robotic assisted unilateral salpingo-oophorectomy, contralateral salpingectomy, intrauterine device removal, possible staging including hysterectomy, contralateral oophorectomy on 12/20/23.  The risks of surgery were discussed in detail and she understands these to including but not limited to bleeding requiring a blood transfusion, infection, injury to adjacent organs (including but not limited to the bowels, bladder, ureters, nerves, blood vessels), thromboembolic events, wound separation, hernia, vaginal cuff separation, possible risk of lymphedema and lymphocyst if lymphadenectomy performed, unforseen complication, and possible need for re-exploration.  If the patient experiences any of these events, she understands that her hospitalization or recovery may be prolonged and that she may need to take additional medications for a prolonged period. The patient will receive DVT and antibiotic prophylaxis as indicated.  She voiced a clear understanding. She had the opportunity to ask questions and informed consent was obtained today. She wishes to proceed.  She will proceed to the lab today for CEA, CA19-9. She does not require preoperative clearance. Her METs are >4.  All preoperative instructions were reviewed. Postoperative expectations were also reviewed. Written handouts were provided to the patient.  We did have an extensive conversation about her incision closures.  She has had complication following some other surgeries in terms of reaction to certain sutures.  In review of her chart, she had Monocryl placed for her laparoscopic incisions for appendectomy.  She reports that she healed well with this.  Thus we should be okay to close her port sites in our usual fashion with Monocryl and surgical glue.   A copy of this note was sent to the patient's referring provider.  Clide Cliff, MD Gynecologic Oncology   Medical Decision Making I personally spent  TOTAL 70 minutes face-to-face and non-face-to-face in the care of this patient, which includes all pre, intra, and post visit time on the date of service.   ------------  CC: Pelvic mass  HISTORY OF PRESENT ILLNESS:  Emma Mathews is a 48 y.o. woman who is seen in consultation at the request of  Osborn Coho, MD for evaluation of cystic pelvic mass.  Patient presented to the emergency department in June 2024 with flank pain.  She underwent a CT renal stone study at that time which incidentally identified her IUD to be within the myometrium partially and additionally noted a 8 cm ovarian cystic lesion.  She followed up with her OB/GYN in December for an annual exam.  Notes indicate that patient has had a long history of ovarian cysts.  Last ultrasound in [facility] from 2018 noted a 5.4 cm benign-appearing left ovarian cyst.  At the time of her OB/GYN visit in December, she had a Pap  smear collected, her IUD removed and replaced, and a CA125  collected.  The CA125 was normal at 9.5.  Her Pap smear returned NILM, HPV negative.  She then underwent an MRI pelvis on 11/30/2023 which noted a 9.2 x 8 x 6.2 cm complex cystic lesion in the pelvis likely arising from the left ovary.  Today patient presents with her husband.  She reports that she has experienced some pelvic pain after her IUD was removed and replaced but this is overall calm down.  Experiences occasional random cramping and fullness.  Does note urinary frequency for the past year approximately.  She had been seeing [facility] in the setting of her kidney stone and was started on Gemtesa which has helped some but not completely resolved symptoms.  She also has been experiencing early satiety for about a year.  She reports that this preceded her starting Mounjaro.  She has lost about 70 pounds of Mounjaro.  She also notes constipation that has been going on for about 1-1 and half years for which she takes MiraLAX, Colace, and occasional milk of magnesia and suppository.  She follows with her PCP for this but is up-to-date on her colonoscopy.  She denies abdominal bloating.    PAST MEDICAL HISTORY: Past Medical History:  Diagnosis Date   Anemia    Anxiety    Appendicitis, acute 10/02/2013   Cancer (HCC)    melanoma excision in 2013   Cholecystitis    Complication of anesthesia    slow to wake up   Depression    Encounter for lipid screening for cardiovascular disease 01/13/2023   Headache    migraine   Mammographic breast lesion 06/28/2017   S/p biopsy, benign    Plantar fasciitis, left 06/05/2019   Pneumonia 2010   Polycythemia, secondary 05/11/2023   PONV (postoperative nausea and vomiting)    Seasonal allergies    STRESS FRACTURE, FOOT 04/08/2009   Qualifier: Diagnosis of  By: Lovell Sheehan MD, Balinda Quails    Vision abnormalities    s/p Lasik    PAST SURGICAL HISTORY: Past Surgical History:  Procedure Laterality Date   CHOLECYSTECTOMY     EXTRACORPOREAL SHOCK WAVE  LITHOTRIPSY Right 04/18/2023   Procedure: RIGHT EXTRACORPOREAL SHOCK WAVE LITHOTRIPSY (ESWL);  Surgeon: Jannifer Hick, MD;  Location: [facility];  Service: Urology;  Laterality: Right;   EYE SURGERY Bilateral    PRK   Labrum Repair Right 11/15/1998   Open   LAPAROSCOPIC APPENDECTOMY N/A 09/02/2013   Procedure: APPENDECTOMY LAPAROSCOPIC;  Surgeon: Shelly Rubenstein, MD;  Location: [facility] OR;  Service: General;  Laterality: N/A;   SHOULDER ARTHROSCOPY Left 11/16/2003   bone spurs    SHOULDER ARTHROSCOPY Right 11/01/2017   Procedure: RIGHT SHOULDER ARTHROSCOPY WITH SUBACROMIAL DECOMPRESSION;  Surgeon: Cammy Copa, MD;  [facility];  Service: Orthopedics;  Laterality: Right;   SHOULDER ARTHROSCOPY WITH SUBACROMIAL DECOMPRESSION, ROTATOR CUFF REPAIR AND BICEP TENDON REPAIR Right 02/24/2016   Procedure: RIGHT SHOULDER ARTHROSCOPY WITH DEBRIDEMENT, BICEPS TENODESIS;  Surgeon: Cammy Copa, MD;  Location: [facility] OR;  Service: Orthopedics;  Laterality: Right;   SHOULDER SURGERY Right 11/15/2005   Revision and repair flap   WISDOM TOOTH EXTRACTION     WRIST ARTHROSCOPY Right 11/09/2017   Procedure: ARTHROSCOPY WRIST WITH DEBRIDEMENT;  Surgeon: Dairl Ponder, MD;  Location: [facility];  Service: Orthopedics;  Laterality: Right;    OB/GYN HISTORY: OB History  No obstetric history on file.  Age at menarche: 65 Age at menopause: n/a Hx of HRT: IUD in place Hx of STI: no Last pap: 11/15/23 NILM HPV HR neg History of abnormal pap smears: no  SCREENING STUDIES:  Last mammogram: 10/2023 Last colonoscopy: 2024  MEDICATIONS:  Current Outpatient Medications:    FIBER GUMMIES PO, Take by mouth., Disp: , Rfl:    levonorgestrel (MIRENA) 20 MCG/24HR IUD, Mirena 20 mcg/24 hours (5 yrs) 52 mg intrauterine device, Disp: , Rfl:    mometasone (NASONEX) 50 MCG/ACT nasal spray, Nasonex, Disp: , Rfl:    Multiple Vitamins-Minerals (MULTIPLE VITAMINS/WOMENS  PO), Multiple Vitamin, Womens, Disp: , Rfl:    pantoprazole (PROTONIX) 40 MG tablet, Take 1 tablet (40 mg total) by mouth daily. Take 30 minutes before breakfast., Disp: 90 tablet, Rfl: 1   tirzepatide (MOUNJARO) 5 MG/0.5ML Pen, Inject 5 mg into the skin once a week., Disp: 2 mL, Rfl: 1   Vibegron (GEMTESA) 75 MG TABS, Take 1 tablet (75 mg total) by mouth daily., Disp: 30 tablet, Rfl: 11   levocetirizine (XYZAL ALLERGY 24HR) 5 MG tablet, Take 1 tablet every day by oral route., Disp: , Rfl:    mirabegron ER (MYRBETRIQ) 50 MG TB24 tablet, Take 1 tablet (50 mg total) by mouth daily., Disp: 90 tablet, Rfl: 3   rizatriptan (MAXALT-MLT) 10 MG disintegrating tablet, DISSOLVE 1 TABLET (10 MG TOTAL) BY MOUTH AS NEEDED. MAY REPEAT IN 2 HOURS IF NEEDED (Patient not taking: Reported on 12/09/2023), Disp: 15 tablet, Rfl: 0   SUMAtriptan 6 MG/0.5ML SOAJ, Inject 6mg  at onset of migraine.  May repeat in 2 hrs, if needed.  Max dose: 2 injections/day. This is a 30 day prescription. (Patient not taking: Reported on 12/09/2023), Disp: 10 Cartridge, Rfl: 5   Vibegron (GEMTESA) 75 MG TABS, Take 1 tablet (75 mg total) by mouth daily. (Patient not taking: Reported on 12/09/2023), Disp: 30 tablet, Rfl: 11  ALLERGIES: Allergies  Allergen Reactions   Diphenhydramine Hcl Hives and Other (See Comments)    Pt states she can take Dye free Benadryl   Diphenhydramine Hcl Other (See Comments) and Hives    Pt states she can take Dye free Benadryl   Iodinated Contrast Media Shortness Of Breath, Nausea And Vomiting, Other (See Comments) and Anaphylaxis    Shortness of breath   Chlorhexidine Gluconate Hives   Codeine Hives, Nausea And Vomiting and Rash    FAMILY HISTORY: Family History  Adopted: Yes  Family history unknown: Yes    SOCIAL HISTORY: Social History   Socioeconomic History   Marital status: Married    Spouse name: Clifton Custard   Number of children: 1   Years of education: Not on file   Highest education level: Not  on file  Occupational History   Occupation: Print production planner  Tobacco Use   Smoking status: Never   Smokeless tobacco: Never  Vaping Use   Vaping status: Never Used  Substance and Sexual Activity   Alcohol use: Yes    Comment: ocassional - social 1- 2 drinks   Drug use: No   Sexual activity: Yes    Birth control/protection: I.U.D.  Other Topics Concern   Not on file  Social History Narrative   Patient lives with husband and one child, a son named Max.    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    Social Drivers of Corporate investment banker Strain: Not on file  Food Insecurity: No Food Insecurity (12/09/2023)   Hunger  Vital Sign    Worried About Programme researcher, broadcasting/film/video in the Last Year: Never true    Ran Out of Food in the Last Year: Never true  Transportation Needs: No Transportation Needs (12/09/2023)   PRAPARE - Administrator, Civil Service (Medical): No    Lack of Transportation (Non-Medical): No  Physical Activity: Not on file  Stress: Not on file  Social Connections: Not on file  Intimate Partner Violence: Not At Risk (12/09/2023)   Humiliation, Afraid, Rape, and Kick questionnaire    Fear of Current or Ex-Partner: No    Emotionally Abused: No    Physically Abused: No    Sexually Abused: No    REVIEW OF SYSTEMS: New patient intake form was reviewed.  Complete 10-system review is negative except for the following: Constipation, urinary frequency, fatigue, intermittent vaginal bleeding  PHYSICAL EXAM: BP 124/78 (BP Location: Left Arm, Patient Position: Sitting)   Pulse 75   Temp 98.6 F (37 C) (Oral)   Resp 20   Ht 5\' 6"  (1.676 m)   Wt 168 lb 6.4 oz (76.4 kg)   SpO2 100%   BMI 27.18 kg/m  Constitutional: No acute distress. Neuro/Psych: Alert, oriented.  Head and Neck: Normocephalic, atraumatic. Neck symmetric without masses. Sclera anicteric.  Respiratory: Normal work of breathing. Clear to auscultation  bilaterally. Cardiovascular: Regular rate and rhythm, no murmurs, rubs, or gallops. Abdomen: Normoactive bowel sounds. Soft, non-distended, non-tender to palpation. No masses appreciated. Well healed laparoscopic incisions. Extremities: Grossly normal range of motion. Warm, well perfused. No edema bilaterally. Skin: No rashes or lesions. Lymphatic: No cervical, supraclavicular, or inguinal adenopathy. Genitourinary: External genitalia without lesions. Urethral meatus without lesions or prolapse. On speculum exam, vagina and cervix without lesions. Bimanual exam reveals normal cervix, small uterus. Fullness in the posterior cul-de-sac.  Exam chaperoned by Warner Mccreedy, NP   LABORATORY AND RADIOLOGIC DATA: Outside medical records were reviewed to synthesize the above history, along with the history and physical obtained during the visit.  Outside laboratory, pathology, and imaging reports were reviewed, with pertinent results below.  I personally reviewed the outside images.  WBC  Date Value Ref Range Status  07/08/2023 5.3 4.0 - 10.5 K/uL Final   WBC Count  Date Value Ref Range Status  11/28/2023 6.1 4.0 - 10.5 K/uL Final   Hemoglobin  Date Value Ref Range Status  11/28/2023 14.1 12.0 - 15.0 g/dL Final  16/08/9603 54.0 11.1 - 15.9 g/dL Final   HCT  Date Value Ref Range Status  11/28/2023 41.1 36.0 - 46.0 % Final   Hematocrit  Date Value Ref Range Status  12/08/2020 44.9 34.0 - 46.6 % Final   Platelets  Date Value Ref Range Status  02/01/2018 216 150 - 379 x10E3/uL Final   Platelet Count  Date Value Ref Range Status  11/28/2023 245 150 - 400 K/uL Final   Magnesium  Date Value Ref Range Status  04/14/2009 1.8 1.5 - 2.5 mg/dL Final   Creatinine, Ser  Date Value Ref Range Status  07/08/2023 0.81 0.40 - 1.20 mg/dL Final   AST  Date Value Ref Range Status  07/08/2023 14 0 - 37 U/L Final   ALT  Date Value Ref Range Status  07/08/2023 10 0 - 35 U/L Final   CA125  (11/15/23): 9.8  MR PELVIS WO CONTRAST 11/30/2023  Narrative CLINICAL DATA:  Left adnexal cyst noted on recent CT scan.  EXAM: MRI PELVIS WITHOUT CONTRAST  TECHNIQUE: Multiplanar multisequence MR imaging of  the pelvis was performed. No intravenous contrast was administered.  COMPARISON:  Noncontrast CT scan 04/20/2023  FINDINGS: Urinary Tract: The bladder is unremarkable. No bladder mass or calculi.  Bowel: The rectum, sigmoid colon and visualized small-bowel loops are grossly normal.  Vascular/Lymphatic: The major vascular structures are grossly without contrast. No lower abdominal or pelvic adenopathy. No inguinal adenopathy.  Reproductive: An IUD is noted in the endometrial canal. Slight thickening and irregularity of the junctional zone could suggest adenomyosis. No worrisome myometrial findings. Nabothian cysts are noted at the cervix. Normal cervical stroma.  Both ovaries are normal.  As demonstrated on the prior CT scan there is a large cystic lesion in the pelvis most likely arising from the left ovary and measuring a maximum 9.2 x 8.0 x 6.2 cm. It has septations. There is a small nodular component on the right side. This measures a maximum of 15 mm and has some internal increased T1 signal intensity suggesting hemorrhage or proteinaceous debris. This nodule is diffusion positive. The rest of the lesion is diffusion negative. No contrast was administered.  Other: No significant free pelvic fluid collections. No inguinal mass or hernia.  Musculoskeletal: No significant bony findings. Degenerative disc disease at L4-5 with a bulging annulus noted.  IMPRESSION: 1. 9.2 x 8.0 x 6.2 cm complex cystic lesion in the pelvis most likely arising from the left ovary. This has septations and a small nodular component on the right side. This nodule is diffusion positive. The rest of the lesion is diffusion negative. 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  Recommend gynecologic oncology consultation. 2. An IUD is noted in the endometrial canal. 3. Slight thickening and irregularity of the junctional zone could suggest adenomyosis. 4. No pelvic adenopathy.   Electronically Signed By: Rudie Meyer M.D. On: 12/06/2023 14:58

## 2023-12-12 NOTE — Patient Instructions (Signed)
SURGICAL WAITING ROOM VISITATION Patients having surgery or a procedure may have no more than 2 support people in the waiting area - these visitors may rotate in the visitor waiting room.   Due to an increase in RSV and influenza rates and associated hospitalizations, children ages 67 and under may not visit patients in [facility] hospitals. If the patient needs to stay at the hospital during part of their recovery, the visitor guidelines for inpatient rooms apply.  PRE-OP VISITATION  Pre-op nurse will coordinate an appropriate time for 1 support person to accompany the patient in pre-op.  This support person may not rotate.  This visitor will be contacted when the time is appropriate for the visitor to come back in the pre-op area.  Please refer to the [facility] website for the visitor guidelines for Inpatients (after your surgery is over and you are in a regular room).  You are not required to quarantine at this time prior to your surgery. However, you must do this: Hand Hygiene often Do NOT share personal items Notify your provider if you are in close contact with someone who has COVID or you develop fever 100.4 or greater, new onset of sneezing, cough, sore throat, shortness of breath or body aches.  If you test positive for Covid or have been in contact with anyone that has tested positive in the last 10 days please notify you surgeon.    Your procedure is scheduled on:  TUESDAY  December 20, 2023  Report to [facility] Main Entrance: [facility] entrance where the [employer] is available.   Report to admitting at: 11:15    AM  Call this number if you have any questions or problems the morning of surgery [phone]  FOLLOW ANY ADDITIONAL PRE OP INSTRUCTIONS YOU RECEIVED FROM YOUR SURGEON'S OFFICE!!!  Eat a light diet the day before surgery.  Examples including soups, broths, toast, yogurt, mashed potatoes.  AVOID GAS PRODUCING FOODS. Things to avoid include carbonated  beverages (fizzy beverages, sodas), raw fruits and raw vegetables (uncooked), or beans.  Do not eat food after Midnight the night prior to your surgery/procedure.  After Midnight you may have the following liquids until   10:30 AM DAY OF SURGERY  Clear Liquid Diet Water Black Coffee (sugar ok, NO MILK/CREAM OR CREAMERS)  Tea (sugar ok, NO MILK/CREAM OR CREAMERS) regular and decaf                             Plain Jell-O  with no fruit (NO RED)                                           Fruit ices (not with fruit pulp, NO RED)                                     Popsicles (NO RED)                                                                  Juice: NO  CITRUS JUICES: only apple, WHITE grape, WHITE cranberry Sports drinks like Gatorade or Powerade (NO RED)            Oral Hygiene is also important to reduce your risk of infection.        Remember - BRUSH YOUR TEETH THE MORNING OF SURGERY WITH YOUR REGULAR TOOTHPASTE  Do NOT smoke after Midnight the night before surgery.  STOP TAKING all Vitamins, Herbs and supplements 1 week before your surgery.   Take ONLY these medicines the morning of surgery with A SIP OF WATER: pantoprazole, Vibegron (Gemtesa).  You may use your Nasonex nasal spray if needed.  You may not have any metal on your body including hair pins, jewelry, and body piercing  Do not wear make-up, lotions, powders, perfumes  or deodorant  Do not wear nail polish including gel and S&S, artificial / acrylic nails, or any other type of covering on natural nails including finger and toenails. If you have artificial nails, gel coating, etc., that needs to be removed by a nail salon, Please have this removed prior to surgery. Not doing so may mean that your surgery could be cancelled or delayed if the Surgeon or anesthesia staff feels like they are unable to monitor you safely.   Do not shave 48 hours prior to surgery to avoid nicks in your skin which may contribute to postoperative  infections.   Contacts, Hearing Aids, dentures or bridgework may not be worn into surgery. DENTURES WILL BE REMOVED PRIOR TO SURGERY PLEASE DO NOT APPLY "Poly grip" OR ADHESIVES!!!  You may bring a small overnight bag with you on the day of surgery, only pack items that are not valuable. [facility] IS NOT RESPONSIBLE   FOR VALUABLES THAT ARE LOST OR STOLEN.   Patients discharged on the day of surgery will not be allowed to drive home.  Someone NEEDS to stay with you for the first 24 hours after anesthesia.  Do not bring your home medications to the hospital. The Pharmacy will dispense medications listed on your medication list to you during your admission in the Hospital.  Special Instructions: Bring a copy of your healthcare power of attorney and living will documents the day of surgery, if you wish to have them scanned into your [facility] Records- EPIC  Please read over the following fact sheets you were given: IF YOU HAVE QUESTIONS ABOUT YOUR PRE-OP INSTRUCTIONS, PLEASE CALL [phone]   [facility] - Preparing for Surgery Before surgery, you can play an important role.  Because skin is not sterile, your skin needs to be as free of germs as possible.  You can reduce the number of germs on your skin by washing with CHG (chlorahexidine gluconate) soap before surgery.  CHG is an antiseptic cleaner which kills germs and bonds with the skin to continue killing germs even after washing. Please DO NOT use if you have an allergy to CHG or antibacterial soaps.  If your skin becomes reddened/irritated stop using the CHG and inform your nurse when you arrive at Short Stay. Do not shave (including legs and underarms) for at least 48 hours prior to the first CHG shower.  You may shave your face/neck.  Please follow these instructions carefully:  1.  Shower with CHG Soap the night before surgery and the  morning of surgery.  2.  If you choose to wash your hair, wash your hair first as usual  with your normal  shampoo.  3.  After you shampoo,  rinse your hair and body thoroughly to remove the shampoo.                             4.  Use CHG as you would any other liquid soap.  You can apply chg directly to the skin and wash.  Gently with a scrungie or clean washcloth.  5.  Apply the CHG Soap to your body ONLY FROM THE NECK DOWN.   Do not use on face/ open                           Wound or open sores. Avoid contact with eyes, ears mouth and genitals (private parts).                       Wash face,  Genitals (private parts) with your normal soap.             6.  Wash thoroughly, paying special attention to the area where your  surgery  will be performed.  7.  Thoroughly rinse your body with warm water from the neck down.  8.  DO NOT shower/wash with your normal soap after using and rinsing off the CHG Soap.            9.  Pat yourself dry with a clean towel.            10.  Wear clean pajamas.            11.  Place clean sheets on your bed the night of your first shower and do not  sleep with pets.  ON THE DAY OF SURGERY : Do not apply any lotions/deodorants the morning of surgery.  Please wear clean clothes to the hospital/surgery center.     FAILURE TO FOLLOW THESE INSTRUCTIONS MAY RESULT IN THE CANCELLATION OF YOUR SURGERY  PATIENT SIGNATURE_________________________________  NURSE SIGNATURE__________________________________  ________________________________________________________________________

## 2023-12-12 NOTE — H&P (View-Only) (Signed)
 GYNECOLOGIC ONCOLOGY NEW PATIENT CONSULTATION  Date of Service: 12/12/2023 Referring Provider: Osborn Coho, MD   ASSESSMENT AND PLAN: Emma Mathews is a 48 y.o. woman with 9.2 cm complex cystic mass, normal CA125.  We reviewed that the exact etiology of the pelvic mass is unclear, but could include a benign, borderline, or malignant process.  The recommended treatment is surgical excision to make a definitive diagnosis. Because the mass is relatively small, we feel that a minimally invasive approach is feasible, using robotic assistance.   In the event of malignancy or borderline tumor on frozen section, we will perform indicated staging procedures. We discussed that these procedures may include hysterectomy, contralateral salpingo-oophorectomy, omentectomy pelvic and/or para-aortic lymphadenectomy, peritoneal biopsies. We would also remove any tissue concerning for metastatic disease which could require additional procedures including bowel surgery.  Patient was consented for: Robotic assisted unilateral salpingo-oophorectomy, contralateral salpingectomy, intrauterine device removal, possible staging including hysterectomy, contralateral oophorectomy on 12/20/23.  The risks of surgery were discussed in detail and she understands these to including but not limited to bleeding requiring a blood transfusion, infection, injury to adjacent organs (including but not limited to the bowels, bladder, ureters, nerves, blood vessels), thromboembolic events, wound separation, hernia, vaginal cuff separation, possible risk of lymphedema and lymphocyst if lymphadenectomy performed, unforseen complication, and possible need for re-exploration.  If the patient experiences any of these events, she understands that her hospitalization or recovery may be prolonged and that she may need to take additional medications for a prolonged period. The patient will receive DVT and antibiotic prophylaxis as indicated.  She voiced a clear understanding. She had the opportunity to ask questions and informed consent was obtained today. She wishes to proceed.  She will proceed to the lab today for CEA, CA19-9. She does not require preoperative clearance. Her METs are >4.  All preoperative instructions were reviewed. Postoperative expectations were also reviewed. Written handouts were provided to the patient.  We did have an extensive conversation about her incision closures.  She has had complication following some other surgeries in terms of reaction to certain sutures.  In review of her chart, she had Monocryl placed for her laparoscopic incisions for appendectomy.  She reports that she healed well with this.  Thus we should be okay to close her port sites in our usual fashion with Monocryl and surgical glue.   A copy of this note was sent to the patient's referring provider.  Emma Cliff, MD Gynecologic Oncology   Medical Decision Making I personally spent  TOTAL 70 minutes face-to-face and non-face-to-face in the care of this patient, which includes all pre, intra, and post visit time on the date of service.   ------------  CC: Pelvic mass  HISTORY OF PRESENT ILLNESS:  Emma Mathews is a 48 y.o. woman who is seen in consultation at the request of  Osborn Coho, MD for evaluation of cystic pelvic mass.  Patient presented to the emergency department in June 2024 with flank pain.  She underwent a CT renal stone study at that time which incidentally identified her IUD to be within the myometrium partially and additionally noted a 8 cm ovarian cystic lesion.  She followed up with her OB/GYN in December for an annual exam.  Notes indicate that patient has had a long history of ovarian cysts.  Last ultrasound in [facility] from 2018 noted a 5.4 cm benign-appearing left ovarian cyst.  At the time of her OB/GYN visit in December, she had a Pap  smear collected, her IUD removed and replaced, and a CA125  collected.  The CA125 was normal at 9.5.  Her Pap smear returned NILM, HPV negative.  She then underwent an MRI pelvis on 11/30/2023 which noted a 9.2 x 8 x 6.2 cm complex cystic lesion in the pelvis likely arising from the left ovary.  Today patient presents with her husband.  She reports that she has experienced some pelvic pain after her IUD was removed and replaced but this is overall calm down.  Experiences occasional random cramping and fullness.  Does note urinary frequency for the past year approximately.  She had been seeing [facility] in the setting of her kidney stone and was started on Gemtesa which has helped some but not completely resolved symptoms.  She also has been experiencing early satiety for about a year.  She reports that this preceded her starting Mounjaro.  She has lost about 70 pounds of Mounjaro.  She also notes constipation that has been going on for about 1-1 and half years for which she takes MiraLAX, Colace, and occasional milk of magnesia and suppository.  She follows with her PCP for this but is up-to-date on her colonoscopy.  She denies abdominal bloating.    PAST MEDICAL HISTORY: Past Medical History:  Diagnosis Date   Anemia    Anxiety    Appendicitis, acute 10/02/2013   Cancer (HCC)    melanoma excision in 2013   Cholecystitis    Complication of anesthesia    slow to wake up   Depression    Encounter for lipid screening for cardiovascular disease 01/13/2023   Headache    migraine   Mammographic breast lesion 06/28/2017   S/p biopsy, benign    Plantar fasciitis, left 06/05/2019   Pneumonia 2010   Polycythemia, secondary 05/11/2023   PONV (postoperative nausea and vomiting)    Seasonal allergies    STRESS FRACTURE, FOOT 04/08/2009   Qualifier: Diagnosis of  By: Lovell Sheehan MD, Balinda Quails    Vision abnormalities    s/p Lasik    PAST SURGICAL HISTORY: Past Surgical History:  Procedure Laterality Date   CHOLECYSTECTOMY     EXTRACORPOREAL SHOCK WAVE  LITHOTRIPSY Right 04/18/2023   Procedure: RIGHT EXTRACORPOREAL SHOCK WAVE LITHOTRIPSY (ESWL);  Surgeon: Jannifer Hick, MD;  Location: [facility];  Service: Urology;  Laterality: Right;   EYE SURGERY Bilateral    PRK   Labrum Repair Right 11/15/1998   Open   LAPAROSCOPIC APPENDECTOMY N/A 09/02/2013   Procedure: APPENDECTOMY LAPAROSCOPIC;  Surgeon: Shelly Rubenstein, MD;  Location: [facility] OR;  Service: General;  Laterality: N/A;   SHOULDER ARTHROSCOPY Left 11/16/2003   bone spurs    SHOULDER ARTHROSCOPY Right 11/01/2017   Procedure: RIGHT SHOULDER ARTHROSCOPY WITH SUBACROMIAL DECOMPRESSION;  Surgeon: Cammy Copa, MD;  [facility];  Service: Orthopedics;  Laterality: Right;   SHOULDER ARTHROSCOPY WITH SUBACROMIAL DECOMPRESSION, ROTATOR CUFF REPAIR AND BICEP TENDON REPAIR Right 02/24/2016   Procedure: RIGHT SHOULDER ARTHROSCOPY WITH DEBRIDEMENT, BICEPS TENODESIS;  Surgeon: Cammy Copa, MD;  Location: [facility] OR;  Service: Orthopedics;  Laterality: Right;   SHOULDER SURGERY Right 11/15/2005   Revision and repair flap   WISDOM TOOTH EXTRACTION     WRIST ARTHROSCOPY Right 11/09/2017   Procedure: ARTHROSCOPY WRIST WITH DEBRIDEMENT;  Surgeon: Dairl Ponder, MD;  Location: [facility];  Service: Orthopedics;  Laterality: Right;    OB/GYN HISTORY: OB History  No obstetric history on file.  Age at menarche: 65 Age at menopause: n/a Hx of HRT: IUD in place Hx of STI: no Last pap: 11/15/23 NILM HPV HR neg History of abnormal pap smears: no  SCREENING STUDIES:  Last mammogram: 10/2023 Last colonoscopy: 2024  MEDICATIONS:  Current Outpatient Medications:    FIBER GUMMIES PO, Take by mouth., Disp: , Rfl:    levonorgestrel (MIRENA) 20 MCG/24HR IUD, Mirena 20 mcg/24 hours (5 yrs) 52 mg intrauterine device, Disp: , Rfl:    mometasone (NASONEX) 50 MCG/ACT nasal spray, Nasonex, Disp: , Rfl:    Multiple Vitamins-Minerals (MULTIPLE VITAMINS/WOMENS  PO), Multiple Vitamin, Womens, Disp: , Rfl:    pantoprazole (PROTONIX) 40 MG tablet, Take 1 tablet (40 mg total) by mouth daily. Take 30 minutes before breakfast., Disp: 90 tablet, Rfl: 1   tirzepatide (MOUNJARO) 5 MG/0.5ML Pen, Inject 5 mg into the skin once a week., Disp: 2 mL, Rfl: 1   Vibegron (GEMTESA) 75 MG TABS, Take 1 tablet (75 mg total) by mouth daily., Disp: 30 tablet, Rfl: 11   levocetirizine (XYZAL ALLERGY 24HR) 5 MG tablet, Take 1 tablet every day by oral route., Disp: , Rfl:    mirabegron ER (MYRBETRIQ) 50 MG TB24 tablet, Take 1 tablet (50 mg total) by mouth daily., Disp: 90 tablet, Rfl: 3   rizatriptan (MAXALT-MLT) 10 MG disintegrating tablet, DISSOLVE 1 TABLET (10 MG TOTAL) BY MOUTH AS NEEDED. MAY REPEAT IN 2 HOURS IF NEEDED (Patient not taking: Reported on 12/09/2023), Disp: 15 tablet, Rfl: 0   SUMAtriptan 6 MG/0.5ML SOAJ, Inject 6mg  at onset of migraine.  May repeat in 2 hrs, if needed.  Max dose: 2 injections/day. This is a 30 day prescription. (Patient not taking: Reported on 12/09/2023), Disp: 10 Cartridge, Rfl: 5   Vibegron (GEMTESA) 75 MG TABS, Take 1 tablet (75 mg total) by mouth daily. (Patient not taking: Reported on 12/09/2023), Disp: 30 tablet, Rfl: 11  ALLERGIES: Allergies  Allergen Reactions   Diphenhydramine Hcl Hives and Other (See Comments)    Pt states she can take Dye free Benadryl   Diphenhydramine Hcl Other (See Comments) and Hives    Pt states she can take Dye free Benadryl   Iodinated Contrast Media Shortness Of Breath, Nausea And Vomiting, Other (See Comments) and Anaphylaxis    Shortness of breath   Chlorhexidine Gluconate Hives   Codeine Hives, Nausea And Vomiting and Rash    FAMILY HISTORY: Family History  Adopted: Yes  Family history unknown: Yes    SOCIAL HISTORY: Social History   Socioeconomic History   Marital status: Married    Spouse name: Clifton Custard   Number of children: 1   Years of education: Not on file   Highest education level: Not  on file  Occupational History   Occupation: Print production planner  Tobacco Use   Smoking status: Never   Smokeless tobacco: Never  Vaping Use   Vaping status: Never Used  Substance and Sexual Activity   Alcohol use: Yes    Comment: ocassional - social 1- 2 drinks   Drug use: No   Sexual activity: Yes    Birth control/protection: I.U.D.  Other Topics Concern   Not on file  Social History Narrative   Patient lives with husband and one child, a son named Max.    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    Social Drivers of Corporate investment banker Strain: Not on file  Food Insecurity: No Food Insecurity (12/09/2023)   Hunger  Vital Sign    Worried About Programme researcher, broadcasting/film/video in the Last Year: Never true    Ran Out of Food in the Last Year: Never true  Transportation Needs: No Transportation Needs (12/09/2023)   PRAPARE - Administrator, Civil Service (Medical): No    Lack of Transportation (Non-Medical): No  Physical Activity: Not on file  Stress: Not on file  Social Connections: Not on file  Intimate Partner Violence: Not At Risk (12/09/2023)   Humiliation, Afraid, Rape, and Kick questionnaire    Fear of Current or Ex-Partner: No    Emotionally Abused: No    Physically Abused: No    Sexually Abused: No    REVIEW OF SYSTEMS: New patient intake form was reviewed.  Complete 10-system review is negative except for the following: Constipation, urinary frequency, fatigue, intermittent vaginal bleeding  PHYSICAL EXAM: BP 124/78 (BP Location: Left Arm, Patient Position: Sitting)   Pulse 75   Temp 98.6 F (37 C) (Oral)   Resp 20   Ht 5\' 6"  (1.676 m)   Wt 168 lb 6.4 oz (76.4 kg)   SpO2 100%   BMI 27.18 kg/m  Constitutional: No acute distress. Neuro/Psych: Alert, oriented.  Head and Neck: Normocephalic, atraumatic. Neck symmetric without masses. Sclera anicteric.  Respiratory: Normal work of breathing. Clear to auscultation  bilaterally. Cardiovascular: Regular rate and rhythm, no murmurs, rubs, or gallops. Abdomen: Normoactive bowel sounds. Soft, non-distended, non-tender to palpation. No masses appreciated. Well healed laparoscopic incisions. Extremities: Grossly normal range of motion. Warm, well perfused. No edema bilaterally. Skin: No rashes or lesions. Lymphatic: No cervical, supraclavicular, or inguinal adenopathy. Genitourinary: External genitalia without lesions. Urethral meatus without lesions or prolapse. On speculum exam, vagina and cervix without lesions. Bimanual exam reveals normal cervix, small uterus. Fullness in the posterior cul-de-sac.  Exam chaperoned by Warner Mccreedy, NP   LABORATORY AND RADIOLOGIC DATA: Outside medical records were reviewed to synthesize the above history, along with the history and physical obtained during the visit.  Outside laboratory, pathology, and imaging reports were reviewed, with pertinent results below.  I personally reviewed the outside images.  WBC  Date Value Ref Range Status  07/08/2023 5.3 4.0 - 10.5 K/uL Final   WBC Count  Date Value Ref Range Status  11/28/2023 6.1 4.0 - 10.5 K/uL Final   Hemoglobin  Date Value Ref Range Status  11/28/2023 14.1 12.0 - 15.0 g/dL Final  16/08/9603 54.0 11.1 - 15.9 g/dL Final   HCT  Date Value Ref Range Status  11/28/2023 41.1 36.0 - 46.0 % Final   Hematocrit  Date Value Ref Range Status  12/08/2020 44.9 34.0 - 46.6 % Final   Platelets  Date Value Ref Range Status  02/01/2018 216 150 - 379 x10E3/uL Final   Platelet Count  Date Value Ref Range Status  11/28/2023 245 150 - 400 K/uL Final   Magnesium  Date Value Ref Range Status  04/14/2009 1.8 1.5 - 2.5 mg/dL Final   Creatinine, Ser  Date Value Ref Range Status  07/08/2023 0.81 0.40 - 1.20 mg/dL Final   AST  Date Value Ref Range Status  07/08/2023 14 0 - 37 U/L Final   ALT  Date Value Ref Range Status  07/08/2023 10 0 - 35 U/L Final   CA125  (11/15/23): 9.8  MR PELVIS WO CONTRAST 11/30/2023  Narrative CLINICAL DATA:  Left adnexal cyst noted on recent CT scan.  EXAM: MRI PELVIS WITHOUT CONTRAST  TECHNIQUE: Multiplanar multisequence MR imaging of  the pelvis was performed. No intravenous contrast was administered.  COMPARISON:  Noncontrast CT scan 04/20/2023  FINDINGS: Urinary Tract: The bladder is unremarkable. No bladder mass or calculi.  Bowel: The rectum, sigmoid colon and visualized small-bowel loops are grossly normal.  Vascular/Lymphatic: The major vascular structures are grossly without contrast. No lower abdominal or pelvic adenopathy. No inguinal adenopathy.  Reproductive: An IUD is noted in the endometrial canal. Slight thickening and irregularity of the junctional zone could suggest adenomyosis. No worrisome myometrial findings. Nabothian cysts are noted at the cervix. Normal cervical stroma.  Both ovaries are normal.  As demonstrated on the prior CT scan there is a large cystic lesion in the pelvis most likely arising from the left ovary and measuring a maximum 9.2 x 8.0 x 6.2 cm. It has septations. There is a small nodular component on the right side. This measures a maximum of 15 mm and has some internal increased T1 signal intensity suggesting hemorrhage or proteinaceous debris. This nodule is diffusion positive. The rest of the lesion is diffusion negative. No contrast was administered.  Other: No significant free pelvic fluid collections. No inguinal mass or hernia.  Musculoskeletal: No significant bony findings. Degenerative disc disease at L4-5 with a bulging annulus noted.  IMPRESSION: 1. 9.2 x 8.0 x 6.2 cm complex cystic lesion in the pelvis most likely arising from the left ovary. This has septations and a small nodular component on the right side. This nodule is diffusion positive. The rest of the lesion is diffusion negative. 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  Recommend gynecologic oncology consultation. 2. An IUD is noted in the endometrial canal. 3. Slight thickening and irregularity of the junctional zone could suggest adenomyosis. 4. No pelvic adenopathy.   Electronically Signed By: Rudie Meyer M.D. On: 12/06/2023 14:58

## 2023-12-12 NOTE — Progress Notes (Signed)
Patient here for a pre-operative appointment prior to her scheduled surgery on 12/20/2023. She is scheduled for a robotic assisted laparoscopic unilateral oophorectomy, bilateral salpingectomy, possible robotic assisted total hysterectomy, possible staging, IUD removal if no hysterectomy at the time of surgery. The surgery was discussed in detail.  See after visit summary for additional details.     Discussed post-op pain management in detail including the aspects of the enhanced recovery pathway.  Advised her that a new prescription would be sent in for Oxycodone and it is only to be used for after her upcoming surgery.  We discussed the use of tylenol post-op and to monitor for a maximum of 4,000 mg in a 24 hour period.  Also prescribed sennakot to be used after surgery and to hold if having loose stools.  Discussed bowel regimen in detail.     Discussed the use of SCDs and measures to take at home to prevent DVT including frequent mobility.  Reportable signs and symptoms of DVT discussed. Post-operative instructions discussed and expectations for after surgery. Incisional care discussed as well including reportable signs and symptoms including erythema, drainage, wound separation.     30 minutes spent with the patient.  Verbalizing understanding of material discussed. No needs or concerns voiced at the end of the visit.   Advised patient to call for any needs.  Advised that her post-operative medications had been prescribed and could be picked up at any time.    This appointment is included in the global surgical bundle as pre-operative teaching and has no charge.

## 2023-12-12 NOTE — Progress Notes (Signed)
COVID Vaccine received:  []  No [x]  Yes Date of any COVID positive Test in last 90 days:  PCP - Jiles Prows, NP  Cardiologist -  none  Chest x-ray - 09-06-2021  2v  Epic EKG -  no history to warrant Stress Test -  ECHO -  Cardiac Cath -   PCR screen: []  Ordered & Completed []   No Order but Needs PROFEND     []   N/A for this surgery  Surgery Plan:  [x]  Ambulatory   []  Outpatient in bed  []  Admit Anesthesia:    [x]  General  []  Spinal  []   Choice []   MAC  Bowel Prep - []  No  []   Yes __GYN diet  Pacemaker / ICD device [x]  No []  Yes   Spinal Cord Stimulator:[x]  No []  Yes       History of Sleep Apnea? [x]  No []  Yes   CPAP used?- [x]  No []  Yes    Does the patient monitor blood sugar?   [x]  N/A   []  No []  Yes  Patient has: [x]  NO Hx DM   []  Pre-DM   []  DM1  []   DM2 Last A1c was:   5.0 normal on 07-08-2023      GLP1 agonist / usual dose - Mounjaro- for weight loss.     GLP1 instructions: Hold x 7 days  Blood Thinner / Instructions:none Aspirin Instructions:  none  ERAS Protocol Ordered: []  No  [x]  Yes PRE-SURGERY []  ENSURE  []  G2   [x]  No Drink Ordered Patient is to be NPO after: 1030  Dental hx: []  Dentures:  []  N/A      []  Bridge or Partial:                   []  Loose or Damaged teeth:   Comments:   Activity level: Patient is able / unable to climb a flight of stairs without difficulty; []  No CP  []  No SOB, but would have ___   Patient can / can not perform ADLs without assistance.   Anesthesia review: ADD, polycythemia (had therapeutic phlebotomies), anemia  "slow to wake up"  Patient denies shortness of breath, fever, cough and chest pain at PAT appointment.  Patient verbalized understanding and agreement to the Pre-Surgical Instructions that were given to them at this PAT appointment. Patient was also educated of the need to review these PAT instructions again prior to her surgery.I reviewed the appropriate phone numbers to call if they have any and questions or concerns.

## 2023-12-13 LAB — CANCER ANTIGEN 19-9: CA 19-9: 5 U/mL (ref 0–35)

## 2023-12-14 ENCOUNTER — Encounter (HOSPITAL_COMMUNITY)
Admission: RE | Admit: 2023-12-14 | Discharge: 2023-12-14 | Disposition: A | Discharge status: Home / Self Care | Payer: 59 | Source: Ambulatory Visit | Attending: Psychiatry | Admitting: Psychiatry

## 2023-12-14 ENCOUNTER — Other Ambulatory Visit: Payer: Self-pay

## 2023-12-14 ENCOUNTER — Encounter (HOSPITAL_COMMUNITY): Payer: Self-pay

## 2023-12-14 ENCOUNTER — Encounter: Payer: Self-pay | Admitting: Gynecologic Oncology

## 2023-12-14 DIAGNOSIS — R1900 Intra-abdominal and pelvic swelling, mass and lump, unspecified site: Secondary | ICD-10-CM | POA: Insufficient documentation

## 2023-12-14 DIAGNOSIS — Z01812 Encounter for preprocedural laboratory examination: Secondary | ICD-10-CM | POA: Diagnosis not present

## 2023-12-14 LAB — COMPREHENSIVE METABOLIC PANEL
ALT: 11 U/L (ref 0–44)
AST: 15 U/L (ref 15–41)
Albumin: 4.1 g/dL (ref 3.5–5.0)
Alkaline Phosphatase: 58 U/L (ref 38–126)
Anion gap: 8 (ref 5–15)
BUN: 17 mg/dL (ref 6–20)
CO2: 23 mmol/L (ref 22–32)
Calcium: 9.6 mg/dL (ref 8.9–10.3)
Chloride: 106 mmol/L (ref 98–111)
Creatinine, Ser: 0.7 mg/dL (ref 0.44–1.00)
GFR, Estimated: >60 mL/min (ref >60)
Glucose, Bld: 80 mg/dL (ref 70–99)
Potassium: 4.3 mmol/L (ref 3.5–5.1)
Sodium: 137 mmol/L (ref 135–145)
Total Bilirubin: 0.9 mg/dL (ref 0.0–1.2)
Total Protein: 7.8 g/dL (ref 6.5–8.1)

## 2023-12-14 LAB — CBC
HCT: 43.5 % (ref 36.0–46.0)
Hemoglobin: 14.4 g/dL (ref 12.0–15.0)
MCH: 28.6 pg (ref 26.0–34.0)
MCHC: 33.1 g/dL (ref 30.0–36.0)
MCV: 86.3 fL (ref 80.0–100.0)
Platelets: 228 10*3/uL (ref 150–400)
RBC: 5.04 MIL/uL (ref 3.87–5.11)
RDW: 13.7 % (ref 11.5–15.5)
WBC: 4.6 10*3/uL (ref 4.0–10.5)
nRBC: 0 % (ref 0.0–0.2)

## 2023-12-15 ENCOUNTER — Telehealth: Payer: Self-pay

## 2023-12-15 NOTE — Telephone Encounter (Signed)
[employer] forms for FMLA filled out with requested information and faxed back to [phone]  Pt is aware

## 2023-12-17 ENCOUNTER — Telehealth: Payer: Self-pay | Admitting: Psychiatry

## 2023-12-17 NOTE — Telephone Encounter (Signed)
Called pt to see if she is willing to move surgery to 2/11 or 2/18 due to an urgent procedure. Pt reports that her dad died yesterday and they selected a funeral date surrounding her surgical date. Condolences shared.

## 2023-12-19 ENCOUNTER — Telehealth: Payer: Self-pay | Admitting: *Deleted

## 2023-12-19 ENCOUNTER — Encounter: Payer: Self-pay | Admitting: Obstetrics and Gynecology

## 2023-12-19 NOTE — Telephone Encounter (Signed)
Telephone call to check on pre-operative status.  Patient compliant with pre-operative instructions.  Reinforced nothing to eat after midnight. Clear liquids until 0715. Patient to arrive at 0815.  No questions or concerns voiced.  Instructed to call for any needs. 

## 2023-12-20 ENCOUNTER — Encounter (HOSPITAL_COMMUNITY): Payer: Self-pay | Admitting: Psychiatry

## 2023-12-20 ENCOUNTER — Ambulatory Visit (HOSPITAL_COMMUNITY): Payer: 59 | Admitting: Anesthesiology

## 2023-12-20 ENCOUNTER — Ambulatory Visit (HOSPITAL_BASED_OUTPATIENT_CLINIC_OR_DEPARTMENT_OTHER): Payer: 59 | Admitting: Anesthesiology

## 2023-12-20 ENCOUNTER — Encounter (HOSPITAL_COMMUNITY)
Admission: RE | Disposition: A | Discharge status: Home / Self Care | Payer: Self-pay | Source: Home / Self Care | Attending: Psychiatry

## 2023-12-20 ENCOUNTER — Other Ambulatory Visit: Payer: Self-pay

## 2023-12-20 ENCOUNTER — Ambulatory Visit (HOSPITAL_COMMUNITY)
Admission: RE | Admit: 2023-12-20 | Discharge: 2023-12-20 | Disposition: A | Discharge status: Home / Self Care | Payer: 59 | Attending: Psychiatry | Admitting: Psychiatry

## 2023-12-20 DIAGNOSIS — K5900 Constipation, unspecified: Secondary | ICD-10-CM | POA: Insufficient documentation

## 2023-12-20 DIAGNOSIS — D271 Benign neoplasm of left ovary: Secondary | ICD-10-CM | POA: Diagnosis not present

## 2023-12-20 DIAGNOSIS — Z30432 Encounter for removal of intrauterine contraceptive device: Secondary | ICD-10-CM | POA: Insufficient documentation

## 2023-12-20 DIAGNOSIS — N736 Female pelvic peritoneal adhesions (postinfective): Secondary | ICD-10-CM | POA: Insufficient documentation

## 2023-12-20 DIAGNOSIS — Z79899 Other long term (current) drug therapy: Secondary | ICD-10-CM | POA: Insufficient documentation

## 2023-12-20 DIAGNOSIS — Z9049 Acquired absence of other specified parts of digestive tract: Secondary | ICD-10-CM | POA: Diagnosis not present

## 2023-12-20 DIAGNOSIS — D279 Benign neoplasm of unspecified ovary: Secondary | ICD-10-CM | POA: Diagnosis not present

## 2023-12-20 DIAGNOSIS — N83202 Unspecified ovarian cyst, left side: Secondary | ICD-10-CM | POA: Diagnosis not present

## 2023-12-20 DIAGNOSIS — N7092 Oophoritis, unspecified: Secondary | ICD-10-CM | POA: Diagnosis not present

## 2023-12-20 DIAGNOSIS — R1900 Intra-abdominal and pelvic swelling, mass and lump, unspecified site: Secondary | ICD-10-CM

## 2023-12-20 HISTORY — PX: IUD REMOVAL: SHX5392

## 2023-12-20 HISTORY — PX: ROBOTIC ASSISTED SALPINGO OOPHERECTOMY: SHX6082

## 2023-12-20 LAB — TYPE AND SCREEN
ABO/RH(D): B POS
Antibody Screen: NEGATIVE

## 2023-12-20 LAB — ABO/RH: ABO/RH(D): B POS

## 2023-12-20 LAB — POCT PREGNANCY, URINE: Preg Test, Ur: NEGATIVE

## 2023-12-20 SURGERY — SALPINGO-OOPHORECTOMY, ROBOT-ASSISTED
Anesthesia: General

## 2023-12-20 MED ORDER — ORAL CARE MOUTH RINSE
15.0000 mL | Freq: Once | OROMUCOSAL | Status: DC
Start: 2023-12-20 — End: 2023-12-20

## 2023-12-20 MED ORDER — BUPIVACAINE HCL 0.25 % IJ SOLN
INTRAMUSCULAR | Status: DC | PRN
  Administered 2023-12-20: 20 mL

## 2023-12-20 MED ORDER — DEXAMETHASONE SODIUM PHOSPHATE 4 MG/ML IJ SOLN
4.0000 mg | INTRAMUSCULAR | Status: AC
  Administered 2023-12-20: 8 mg via INTRAVENOUS

## 2023-12-20 MED ORDER — LACTATED RINGERS IV SOLN: INTRAVENOUS | Status: DC

## 2023-12-20 MED ORDER — GABAPENTIN 300 MG PO CAPS
300.0000 mg | ORAL_CAPSULE | ORAL | Status: AC
  Administered 2023-12-20: 300 mg via ORAL
  Filled 2023-12-20: qty 1

## 2023-12-20 MED ORDER — MIDAZOLAM HCL 2 MG/2ML IJ SOLN
INTRAMUSCULAR | Status: AC
  Filled 2023-12-20: qty 2

## 2023-12-20 MED ORDER — LACTATED RINGERS IV SOLN: INTRAVENOUS | Status: DC | PRN

## 2023-12-20 MED ORDER — ONDANSETRON HCL 4 MG/2ML IJ SOLN
INTRAMUSCULAR | Status: AC
  Filled 2023-12-20: qty 2

## 2023-12-20 MED ORDER — PHENYLEPHRINE HCL-NACL 20-0.9 MG/250ML-% IV SOLN
INTRAVENOUS | Status: DC | PRN
  Administered 2023-12-20: 25 ug/min via INTRAVENOUS

## 2023-12-20 MED ORDER — SCOPOLAMINE 1 MG/3DAYS TD PT72
1.0000 | MEDICATED_PATCH | TRANSDERMAL | Status: DC
  Administered 2023-12-20: 1.5 mg via TRANSDERMAL
  Filled 2023-12-20: qty 1

## 2023-12-20 MED ORDER — MIDAZOLAM HCL 2 MG/2ML IJ SOLN
INTRAMUSCULAR | Status: DC | PRN
  Administered 2023-12-20: 2 mg via INTRAVENOUS

## 2023-12-20 MED ORDER — PROPOFOL 1000 MG/100ML IV EMUL
INTRAVENOUS | Status: AC
  Filled 2023-12-20: qty 100

## 2023-12-20 MED ORDER — FENTANYL CITRATE (PF) 100 MCG/2ML IJ SOLN
INTRAMUSCULAR | Status: AC
  Filled 2023-12-20: qty 2

## 2023-12-20 MED ORDER — APREPITANT 40 MG PO CAPS: 40.0000 mg | ORAL_CAPSULE | Freq: Once | ORAL | Status: DC

## 2023-12-20 MED ORDER — HYDROMORPHONE HCL 1 MG/ML IJ SOLN
INTRAMUSCULAR | Status: AC
  Administered 2023-12-20: 0.5 mg via INTRAVENOUS
  Filled 2023-12-20: qty 1

## 2023-12-20 MED ORDER — CHLORHEXIDINE GLUCONATE 0.12 % MT SOLN
15.0000 mL | Freq: Once | OROMUCOSAL | Status: DC
Start: 2023-12-20 — End: 2023-12-20

## 2023-12-20 MED ORDER — SODIUM CHLORIDE 0.9 % IV SOLN
150.0000 mg | INTRAVENOUS | Status: AC
  Administered 2023-12-20: 150 mg via INTRAVENOUS
  Filled 2023-12-20: qty 5

## 2023-12-20 MED ORDER — BUPIVACAINE HCL 0.25 % IJ SOLN
INTRAMUSCULAR | Status: AC
  Filled 2023-12-20: qty 1

## 2023-12-20 MED ORDER — PHENYLEPHRINE 80 MCG/ML (10ML) SYRINGE FOR IV PUSH (FOR BLOOD PRESSURE SUPPORT)
PREFILLED_SYRINGE | INTRAVENOUS | Status: AC
  Filled 2023-12-20: qty 10

## 2023-12-20 MED ORDER — DROPERIDOL 2.5 MG/ML IJ SOLN
0.6250 mg | Freq: Once | INTRAMUSCULAR | Status: AC | PRN
  Administered 2023-12-20: 0.625 mg via INTRAVENOUS

## 2023-12-20 MED ORDER — LACTATED RINGERS IR SOLN
Status: DC | PRN
  Administered 2023-12-20: 1000 mL

## 2023-12-20 MED ORDER — LIDOCAINE HCL (PF) 2 % IJ SOLN
INTRAMUSCULAR | Status: AC
  Filled 2023-12-20: qty 5

## 2023-12-20 MED ORDER — STERILE WATER FOR IRRIGATION IR SOLN
Status: DC | PRN
  Administered 2023-12-20: 1000 mL

## 2023-12-20 MED ORDER — ACETAMINOPHEN 500 MG PO TABS
1000.0000 mg | ORAL_TABLET | ORAL | Status: AC
  Administered 2023-12-20: 1000 mg via ORAL
  Filled 2023-12-20: qty 2

## 2023-12-20 MED ORDER — FENTANYL CITRATE (PF) 100 MCG/2ML IJ SOLN
INTRAMUSCULAR | Status: DC | PRN
  Administered 2023-12-20 (×2): 50 ug via INTRAVENOUS

## 2023-12-20 MED ORDER — DEXAMETHASONE SODIUM PHOSPHATE 10 MG/ML IJ SOLN
INTRAMUSCULAR | Status: AC
  Filled 2023-12-20: qty 1

## 2023-12-20 MED ORDER — PHENYLEPHRINE HCL (PRESSORS) 10 MG/ML IV SOLN
INTRAVENOUS | Status: DC | PRN
  Administered 2023-12-20: 80 ug via INTRAVENOUS
  Administered 2023-12-20: 160 ug via INTRAVENOUS

## 2023-12-20 MED ORDER — HYDROMORPHONE HCL 1 MG/ML IJ SOLN
0.2500 mg | INTRAMUSCULAR | Status: DC | PRN
  Administered 2023-12-20 (×2): 0.5 mg via INTRAVENOUS

## 2023-12-20 MED ORDER — PROPOFOL 1000 MG/100ML IV EMUL
INTRAVENOUS | Status: AC
Start: 2023-12-20 — End: ?
  Filled 2023-12-20: qty 100

## 2023-12-20 MED ORDER — SUGAMMADEX SODIUM 200 MG/2ML IV SOLN
INTRAVENOUS | Status: DC | PRN
  Administered 2023-12-20 (×2): 200 mg via INTRAVENOUS

## 2023-12-20 MED ORDER — HEPARIN SODIUM (PORCINE) 5000 UNIT/ML IJ SOLN
5000.0000 [IU] | INTRAMUSCULAR | Status: AC
  Administered 2023-12-20: 5000 [IU] via SUBCUTANEOUS
  Filled 2023-12-20: qty 1

## 2023-12-20 MED ORDER — DROPERIDOL 2.5 MG/ML IJ SOLN
INTRAMUSCULAR | Status: AC
  Filled 2023-12-20: qty 2

## 2023-12-20 MED ORDER — ROCURONIUM BROMIDE 100 MG/10ML IV SOLN
INTRAVENOUS | Status: DC | PRN
  Administered 2023-12-20: 60 mg via INTRAVENOUS

## 2023-12-20 MED ORDER — PROPOFOL 10 MG/ML IV BOLUS
INTRAVENOUS | Status: DC | PRN
  Administered 2023-12-20: 200 mg via INTRAVENOUS
  Administered 2023-12-20: 150 ug/kg/min via INTRAVENOUS

## 2023-12-20 MED ORDER — ROCURONIUM BROMIDE 10 MG/ML (PF) SYRINGE
PREFILLED_SYRINGE | INTRAVENOUS | Status: AC
  Filled 2023-12-20: qty 10

## 2023-12-20 MED ORDER — ONDANSETRON HCL 4 MG/2ML IJ SOLN
INTRAMUSCULAR | Status: DC | PRN
  Administered 2023-12-20: 4 mg via INTRAVENOUS

## 2023-12-20 SURGICAL SUPPLY — 70 items
APPLICATOR SURGIFLO ENDO (HEMOSTASIS) IMPLANT
BAG LAPAROSCOPIC 12 15 PORT 16 (BASKET) IMPLANT
BAG RETRIEVAL 12/15 (BASKET) IMPLANT
BLADE SURG SZ10 CARB STEEL (BLADE) IMPLANT
COVER BACK TABLE 60X90IN (DRAPES) ×2 IMPLANT
COVER TIP SHEARS 8 DVNC (MISCELLANEOUS) ×2 IMPLANT
DERMABOND ADVANCED .7 DNX12 (GAUZE/BANDAGES/DRESSINGS) ×2 IMPLANT
DRAPE ARM DVNC X/XI (DISPOSABLE) ×8 IMPLANT
DRAPE COLUMN DVNC XI (DISPOSABLE) ×2 IMPLANT
DRAPE SHEET LG 3/4 BI-LAMINATE (DRAPES) ×2 IMPLANT
DRAPE SURG IRRIG POUCH 19X23 (DRAPES) ×2 IMPLANT
DRIVER NDL MEGA SUTCUT DVNCXI (INSTRUMENTS) ×1 IMPLANT
DRIVER NDLE MEGA SUTCUT DVNCXI (INSTRUMENTS) ×2 IMPLANT
DRSG OPSITE POSTOP 4X6 (GAUZE/BANDAGES/DRESSINGS) IMPLANT
DRSG OPSITE POSTOP 4X8 (GAUZE/BANDAGES/DRESSINGS) IMPLANT
ELECT PENCIL ROCKER SW 15FT (MISCELLANEOUS) IMPLANT
ELECT REM PT RETURN 15FT ADLT (MISCELLANEOUS) ×2 IMPLANT
FORCEPS BPLR FENES DVNC XI (FORCEP) ×2 IMPLANT
FORCEPS PROGRASP DVNC XI (FORCEP) ×2 IMPLANT
GAUZE 4X4 16PLY ~~LOC~~+RFID DBL (SPONGE) ×2 IMPLANT
GLOVE BIO SURGEON STRL SZ 6 (GLOVE) ×8 IMPLANT
GLOVE BIO SURGEON STRL SZ 6.5 (GLOVE) ×2 IMPLANT
GLOVE BIOGEL PI IND STRL 6.5 (GLOVE) ×4 IMPLANT
GOWN STRL REUS W/ TWL LRG LVL3 (GOWN DISPOSABLE) ×8 IMPLANT
GRASPER SUT TROCAR 14GX15 (MISCELLANEOUS) ×1 IMPLANT
HOLDER FOLEY CATH W/STRAP (MISCELLANEOUS) IMPLANT
IRRIG SUCT STRYKERFLOW 2 WTIP (MISCELLANEOUS) ×2 IMPLANT
IRRIGATION SUCT STRKRFLW 2 WTP (MISCELLANEOUS) ×1 IMPLANT
KIT PROCEDURE DVNC SI (MISCELLANEOUS) IMPLANT
KIT TURNOVER KIT A (KITS) IMPLANT
LIGASURE IMPACT 36 18CM CVD LR (INSTRUMENTS) IMPLANT
MANIPULATOR ADVINCU DEL 3.0 PL (MISCELLANEOUS) IMPLANT
MANIPULATOR ADVINCU DEL 3.5 PL (MISCELLANEOUS) IMPLANT
MANIPULATOR UTERINE 4.5 ZUMI (MISCELLANEOUS) IMPLANT
NDL HYPO 21X1.5 SAFETY (NEEDLE) ×1 IMPLANT
NDL INSUFFLATION 14GA 120MM (NEEDLE) IMPLANT
NDL SPNL 20GX3.5 QUINCKE YW (NEEDLE) IMPLANT
NEEDLE HYPO 21X1.5 SAFETY (NEEDLE) ×2 IMPLANT
NEEDLE INSUFFLATION 14GA 120MM (NEEDLE) IMPLANT
NEEDLE SPNL 20GX3.5 QUINCKE YW (NEEDLE) IMPLANT
OBTURATOR OPTICAL STND 8 DVNC (TROCAR) ×2 IMPLANT
OBTURATOR OPTICALSTD 8 DVNC (TROCAR) ×1 IMPLANT
PACK ROBOT GYN CUSTOM WL (TRAY / TRAY PROCEDURE) ×2 IMPLANT
PAD ARMBOARD 7.5X6 YLW CONV (MISCELLANEOUS) ×2 IMPLANT
PAD POSITIONING PINK XL (MISCELLANEOUS) ×2 IMPLANT
PORT ACCESS TROCAR AIRSEAL 12 (TROCAR) ×1 IMPLANT
SCISSORS MNPLR CVD DVNC XI (INSTRUMENTS) ×2 IMPLANT
SCRUB CHG 4% DYNA-HEX 4OZ (MISCELLANEOUS) ×2 IMPLANT
SEAL UNIV 5-12 XI (MISCELLANEOUS) ×8 IMPLANT
SET TRI-LUMEN FLTR TB AIRSEAL (TUBING) ×2 IMPLANT
SPIKE FLUID TRANSFER (MISCELLANEOUS) ×2 IMPLANT
SPONGE T-LAP 18X18 ~~LOC~~+RFID (SPONGE) IMPLANT
SURGIFLO W/THROMBIN 8M KIT (HEMOSTASIS) IMPLANT
SUT MNCRL AB 4-0 PS2 18 (SUTURE) IMPLANT
SUT PDS AB 1 TP1 54 (SUTURE) IMPLANT
SUT VIC AB 0 CT1 27XBRD ANTBC (SUTURE) IMPLANT
SUT VIC AB 2-0 CT1 TAPERPNT 27 (SUTURE) IMPLANT
SUT VIC AB 4-0 PS2 18 (SUTURE) ×4 IMPLANT
SUT VICRYL 0 27 CT2 27 ABS (SUTURE) ×2 IMPLANT
SYR 10ML LL (SYRINGE) IMPLANT
SYS BAG RETRIEVAL 10MM (BASKET) ×2 IMPLANT
SYS WOUND ALEXIS 18CM MED (MISCELLANEOUS) IMPLANT
SYSTEM BAG RETRIEVAL 10MM (BASKET) IMPLANT
SYSTEM WOUND ALEXIS 18CM MED (MISCELLANEOUS) IMPLANT
TRAP SPECIMEN MUCUS 40CC (MISCELLANEOUS) ×1 IMPLANT
TRAY FOLEY MTR SLVR 16FR STAT (SET/KITS/TRAYS/PACK) ×2 IMPLANT
TROCAR PORT AIRSEAL 5X120 (TROCAR) IMPLANT
UNDERPAD 30X36 HEAVY ABSORB (UNDERPADS AND DIAPERS) ×4 IMPLANT
WATER STERILE IRR 1000ML POUR (IV SOLUTION) ×1 IMPLANT
YANKAUER SUCT BULB TIP 10FT TU (MISCELLANEOUS) IMPLANT

## 2023-12-20 NOTE — Interval H&P Note (Signed)
 History and Physical Interval Note:  12/20/2023 9:58 AM  Emma Mathews  has presented today for surgery, with the diagnosis of pelvic mass.  The various methods of treatment have been discussed with the patient and family. After consideration of risks, benefits and other options for treatment, the patient has consented to  Procedure(s): XI ROBOTIC ASSISTED UNILATERAL OOPHORECTOMY LATERALITY TBD (N/A) XI ROBOTIC ASSISTED SALPINGECTOMY (Bilateral) POSSIBLE XI ROBOTIC ASSISTED TOTAL HYSTERECTOMY WITH POSSIBLE STAGING (N/A) INTRAUTERINE DEVICE (IUD) REMOVAL (N/A) as a surgical intervention.  The patient's history has been reviewed, patient examined, no change in status, stable for surgery.  I have reviewed the patient's chart and labs.  Questions were answered to the patient's satisfaction.     Aizlyn Schifano

## 2023-12-20 NOTE — Anesthesia Procedure Notes (Signed)
 Date/Time: 12/20/2023 11:00 AM  Performed by: Harl Armida PARAS, CRNAPre-anesthesia Checklist: Emergency Drugs available, Patient identified, Patient being monitored and Suction available Patient Re-evaluated:Patient Re-evaluated prior to induction Oxygen Delivery Method: Circle system utilized Preoxygenation: Pre-oxygenation with 100% oxygen Induction Type: IV induction Ventilation: Mask ventilation without difficulty Laryngoscope Size: Mac and 3 Grade View: Grade I Tube type: Oral Tube size: 7.5 mm Number of attempts: 1 Airway Equipment and Method: Stylet Placement Confirmation: ETT inserted through vocal cords under direct vision, positive ETCO2 and breath sounds checked- equal and bilateral Secured at: 22 cm Tube secured with: Tape Dental Injury: Teeth and Oropharynx as per pre-operative assessment

## 2023-12-20 NOTE — Anesthesia Postprocedure Evaluation (Signed)
 Anesthesia Post Note  Patient: Emma Mathews  Procedure(s) Performed: XI ROBOTIC ASSISTED LEFT SALPINGO OOPHORECTOMY RIGHT SALPINGECTOMY INTRAUTERINE DEVICE (IUD) REMOVAL     Patient location during evaluation: PACU Anesthesia Type: General Level of consciousness: sedated and patient cooperative Pain management: pain level controlled Vital Signs Assessment: post-procedure vital signs reviewed and stable Respiratory status: spontaneous breathing Cardiovascular status: stable Anesthetic complications: no   No notable events documented.  Last Vitals:  Vitals:   12/20/23 1400 12/20/23 1425  BP: 108/60 106/69  Pulse: (!) 56 74  Resp:  15  Temp:    SpO2: 98% 97%    Last Pain:  Vitals:   12/20/23 1425  TempSrc:   PainSc: 3                  Norleen Pope

## 2023-12-20 NOTE — Discharge Instructions (Addendum)
AFTER SURGERY INSTRUCTIONS   Return to work: 4-6 weeks if applicable   Activity: 1. Be up and out of the bed during the day.  Take a nap if needed.  You may walk up steps but be careful and use the hand rail.  Stair climbing will tire you more than you think, you may need to stop part way and rest.    2. No lifting or straining for 6 weeks over 10 pounds. No pushing, pulling, straining for 6 weeks.   3. No driving for 8-11 days when the following criteria have been met: Do not drive if you are taking narcotic pain medicine and make sure that your reaction time has returned.    4. You can shower as soon as the next day after surgery. Shower daily.  Use your regular soap and water (not directly on the incision) and pat your incision(s) dry afterwards; don't rub.  No tub baths or submerging your body in water until cleared by your surgeon. If you have the soap that was given to you by pre-surgical testing that was used before surgery, you do not need to use it afterwards because this can irritate your incisions.    5. No sexual activity and nothing in the vagina for 6 weeks if no hysterectomy, 12 weeks if you have a hysterectomy (removal of the uterus and cervix).   6. You may experience a small amount of clear drainage from your incisions, which is normal.  If the drainage persists, increases, or changes color please call the office.   7. Do not use creams, lotions, or ointments such as neosporin on your incisions after surgery until advised by your surgeon because they can cause removal of the dermabond glue on your incisions.     8. You may experience vaginal spotting after surgery or when the stitches at the top of the vagina begin to dissolve (if you have a hysterectomy).  The spotting is normal but if you experience heavy bleeding, call our office.   9. Take Tylenol or ibuprofen first for pain if you are able to take these medications and only use narcotic pain medication for severe pain not  relieved by the Tylenol or Ibuprofen.  Monitor your Tylenol intake to a max of 4,000 mg in a 24 hour period. You can alternate these medications after surgery.   Diet: 1. Low sodium Heart Healthy Diet is recommended but you are cleared to resume your normal (before surgery) diet after your procedure.   2. It is safe to use a laxative, such as Miralax or Colace, if you have difficulty moving your bowels before surgery. You have been prescribed Sennakot-S to take at bedtime every evening after surgery to keep bowel movements regular and to prevent constipation.     Wound Care: 1. Keep clean and dry.  Shower daily.   Reasons to call the Doctor: Fever - Oral temperature greater than 100.4 degrees Fahrenheit Foul-smelling vaginal discharge Difficulty urinating Nausea and vomiting Increased pain at the site of the incision that is unrelieved with pain medicine. Difficulty breathing with or without chest pain New calf pain especially if only on one side Sudden, continuing increased vaginal bleeding with or without clots.   Contacts: For questions or concerns you should contact:   Dr. Clide Cliff at [phone]   Warner Mccreedy, NP at [phone]   After Hours: call [phone] and have the GYN Oncologist paged/contacted (after 5 pm or on the weekends). You will speak with an after  hours RN and let he or she know you have had surgery.   Messages sent via mychart are for non-urgent matters and are not responded to after hours so for urgent needs, please call the after hours number.

## 2023-12-20 NOTE — Progress Notes (Signed)
Father died this pass week

## 2023-12-20 NOTE — Anesthesia Preprocedure Evaluation (Addendum)
Anesthesia Evaluation  Patient identified by MRN, date of birth, ID band Patient awake    Reviewed: Allergy & Precautions, NPO status , Patient's Chart, lab work & pertinent test results  History of Anesthesia Complications (+) PONV and history of anesthetic complications  Airway Mallampati: II  TM Distance: >3 FB Neck ROM: Full    Dental no notable dental hx. (+) Dental Advisory Given, Teeth Intact   Pulmonary pneumonia, resolved   Pulmonary exam normal breath sounds clear to auscultation       Cardiovascular negative cardio ROS Normal cardiovascular exam Rhythm:Regular Rate:Normal     Neuro/Psych  Headaches PSYCHIATRIC DISORDERS Anxiety Depression       GI/Hepatic negative GI ROS, Neg liver ROS,,,  Endo/Other  negative endocrine ROS    Renal/GU negative Renal ROS     Musculoskeletal negative musculoskeletal ROS (+)    Abdominal   Peds  Hematology  (+) Blood dyscrasia, anemia   Anesthesia Other Findings   Reproductive/Obstetrics                              Anesthesia Physical Anesthesia Plan  ASA: 2  Anesthesia Plan: General   Post-op Pain Management: Tylenol PO (pre-op)* and Gabapentin PO (pre-op)*   Induction: Intravenous  PONV Risk Score and Plan: 4 or greater and Ondansetron, Propofol infusion, Treatment may vary due to age or medical condition, Dexamethasone, Scopolamine patch - Pre-op, TIVA, Midazolam and Aprepitant  Airway Management Planned: Oral ETT  Additional Equipment:   Intra-op Plan:   Post-operative Plan: Extubation in OR  Informed Consent: I have reviewed the patients History and Physical, chart, labs and discussed the procedure including the risks, benefits and alternatives for the proposed anesthesia with the patient or authorized representative who has indicated his/her understanding and acceptance.     Dental advisory given  Plan Discussed with:  CRNA  Anesthesia Plan Comments: (2 x PIV)         Anesthesia Quick Evaluation

## 2023-12-20 NOTE — Transfer of Care (Signed)
 Immediate Anesthesia Transfer of Care Note  Patient: Emma Mathews  Procedure(s) Performed: XI ROBOTIC ASSISTED LEFT SALPINGO OOPHORECTOMY RIGHT SALPINGECTOMY INTRAUTERINE DEVICE (IUD) REMOVAL  Patient Location: PACU  Anesthesia Type:General  Level of Consciousness: drowsy  Airway & Oxygen Therapy: Patient Spontanous Breathing and Patient connected to face mask oxygen  Post-op Assessment: Report given to RN and Post -op Vital signs reviewed and stable  Post vital signs: Reviewed and stable  Last Vitals:  Vitals Value Taken Time  BP 114/61 12/20/23 1231  Temp    Pulse 81 12/20/23 1233  Resp 15 12/20/23 1233  SpO2 100 % 12/20/23 1233  Vitals shown include unfiled device data.  Last Pain:  Vitals:   12/20/23 0900  TempSrc:   PainSc: 0-No pain      Patients Stated Pain Goal: 4 (12/20/23 0900)  Complications: No notable events documented.

## 2023-12-20 NOTE — Op Note (Signed)
 GYNECOLOGIC ONCOLOGY OPERATIVE NOTE  Date of Service: 12/20/2023  Preoperative Diagnosis: Complex pelvic mass  Postoperative Diagnosis: Benign left ovarian cystic mass  Procedures: Robotic assisted left salpingo-oophorectomy, right salpingectomy, intrauterine device removal  Surgeon: Emma Masters, MD  Assistants: Olam Mill, MD and (an MD assistant was necessary for tissue manipulation, management of robotic instrumentation, retraction and positioning due to the complexity of the case and hospital policies)  Anesthesia: General  Estimated Blood Loss: 10 mL    Fluids: , crystalloid  Urine Output: 50ml, clear yellow  Findings: On entry to abdomen, normal upper abdominal survey with smooth diaphragm, liver, stomach and normal appearing omentum and bowel. In pelvis, multicystic left ovarian mass. Normal appearing uterus, right fallopian tube and ovary. Surgically absent appendix. Adhesions of the sigmoid colon to the left pelvic sidewall. On pelvic exam, normal appearing cervix with IUD strings visualized. IUD removed without issue. IOFS consistent with benign ovarian cyst.   Specimens:  ID Type Source Tests Collected by Time Destination  1 : Left Tube and Ovary Tissue PATH Gyn tumor resection SURGICAL PATHOLOGY Mathews Hoy, MD 12/20/2023 1125   2 : Right Fallopian Tube Tissue PATH Gyn benign resection SURGICAL PATHOLOGY Mathews Hoy, MD 12/20/2023 1141   A : Pelvic Washing Body Fluid PATH Cytology Pelvic Washing CYTOLOGY - NON PAP Mathews Hoy, MD 12/20/2023 1110     Complications:  None  Indications for Procedure: Emma Mathews is a 48 y.o. woman with a complex adnexal mass.  Prior to the procedure, all risks, benefits, and alternatives were discussed and informed surgical consent was signed.  Procedure: Patient was taken to the operating room where general anesthesia was achieved.  She was positioned in dorsal lithotomy and prepped and draped.  A foley  catheter was inserted into the bladder.  A speculum was placed in the vagina. The IUD strings were grasped with a ring forcep, and the IUD was removed without issue. A hulka manipulator was secured in the cervix.    A 12 mm incision was made in the left upper quadrant near Palmer's point.  The abdomen was entered with a 5 mm OptiView trocar under direct visualization.  The abdomen was insufflated, the patient placed in steep Trendelenburg, and additional trocars were placed as follows: an 8mm robotic trocar superior to the umbilicus, one 8 mm robotic trocar in the right abdomen, and one 8 mm robotic trocar in the left abdomen.  The left upper quadrant trocar was removed and replaced with a 12 mm airseal trocar.  All trocars were placed under direct visualization.  The bowels were moved into the upper abdomen.  The DaVinci robotic surgical system was brought to the patient's bedside and docked.  Adhesions of the sigmoid colon to the left pelvic sidewall were lysed sharply. The peritoneum of the left pelvic sidewall was incised and the left retroperitoneum entered.  The left ureter was identified.  The left infundibulopelvic ligament was isolated, cauterized, and transected.  The broad ligament was incised to the uterine cornu.  The utero-ovarian ligament and the proximal fallopian tube were isolated, cauterized, and transected. On the right, the fallopian tube was grasped and elevated. The mesosalpinx was serially cauterized and transected toward the cornua. The proximal fallopian tube was then cauterized and transected. The right fallopian tube was removed through the assistant trocar. The left adnexa was then placed in an Endo-catch bag and removed through the 12 mm trocar with controlled drainage of the cyst in the bag for removal.  The pelvis  was irrigated and all operative sites were found to be hemostatic.  All instruments were removed and the robot was taken from the patient's bedside. The fascia at the  12 mm incision was closed with 0 Vicryl with the assistance of a PMI device. The abdomen was desufflated and all ports were removed. The skin at all incisions was closed with 4-0 Vicryl to reapproximate the subcutaneous tissue and 4-0 monocryl in a subcuticular fashion followed by surgical glue. The hulka was removed from the cervix.  Patient tolerated the procedure well. Sponge, lap, and instrument counts were correct.  No perioperative antibiotic prophylaxis was indicated for this procedure.  She was extubated and taken to the PACU in stable condition.  Emma Masters, MD Gynecologic Oncology

## 2023-12-21 ENCOUNTER — Encounter (HOSPITAL_COMMUNITY): Payer: Self-pay | Admitting: Psychiatry

## 2023-12-21 ENCOUNTER — Telehealth: Payer: Self-pay

## 2023-12-21 LAB — CYTOLOGY - NON PAP

## 2023-12-21 NOTE — Telephone Encounter (Signed)
 Spoke with Emma Mathews this morning. She states she is eating, drinking and urinating well. She has not had a BM yet but is passing gas. She is taking senokot as prescribed and encouraged her to drink plenty of water . She denies fever or chills. Incisions are dry and intact. She rates her pain 5/10. Her pain is controlled with Tylenol /Advil     Instructed to call office with any fever, chills, purulent drainage, uncontrolled pain or any other questions or concerns. Patient verbalizes understanding.   Pt aware of post op appointments as well as the office number [phone] and after hours number [phone] to call if she has any questions or concerns

## 2023-12-22 LAB — SURGICAL PATHOLOGY

## 2023-12-25 ENCOUNTER — Encounter: Payer: Self-pay | Admitting: Psychiatry

## 2023-12-28 ENCOUNTER — Encounter: Payer: 59 | Admitting: Nurse Practitioner

## 2024-01-02 ENCOUNTER — Encounter: Payer: Self-pay | Admitting: Hematology and Oncology

## 2024-01-02 ENCOUNTER — Inpatient Hospital Stay: Payer: 59 | Attending: Hematology and Oncology | Admitting: Psychiatry

## 2024-01-02 ENCOUNTER — Encounter: Payer: Self-pay | Admitting: Nurse Practitioner

## 2024-01-02 ENCOUNTER — Encounter: Payer: Self-pay | Admitting: Psychiatry

## 2024-01-02 VITALS — BP 116/77 | HR 67 | Temp 97.6°F | Resp 19 | Wt 165.0 lb

## 2024-01-02 DIAGNOSIS — Z90722 Acquired absence of ovaries, bilateral: Secondary | ICD-10-CM

## 2024-01-02 DIAGNOSIS — Z9079 Acquired absence of other genital organ(s): Secondary | ICD-10-CM

## 2024-01-02 DIAGNOSIS — R1900 Intra-abdominal and pelvic swelling, mass and lump, unspecified site: Secondary | ICD-10-CM

## 2024-01-02 DIAGNOSIS — D271 Benign neoplasm of left ovary: Secondary | ICD-10-CM

## 2024-01-02 NOTE — Progress Notes (Signed)
Gynecologic Oncology Return Clinic Visit  Date of Service: 01/02/2024 Referring Provider: Osborn Coho, MD   Assessment & Plan: Emma Mathews is a 48 y.o. woman with a complex adnexal cystic mass who is s/p RA-LSO, right salpingectomy, IUD removal on 12/20/23, final path benign.  Postop: - Pt recovering well from surgery and healing appropriately postoperatively - Intraoperative findings and pathology results reviewed. - Ongoing postoperative expectations and precautions reviewed.  Lifting restrictions are complete. - Pt works in [employer]. Okay to return to work for half days starting Thursday 2/20 with full return to work on Tuesday 2/25. - Okay to return to routine care.   RTC prn.  Clide Cliff, MD Gynecologic Oncology     ----------------------- Reason for Visit: Postop  Interval History: Pt reports that she is recovering well from surgery. She is using tylenol prn for pain. She is eating and drinking well. She is voiding without issue and having regular bowel movements with the assistance of stool softeners. Has had some irregular bleeding with the IUD removed.   Past Medical/Surgical History: Past Medical History:  Diagnosis Date   Anemia    Anxiety    Appendicitis, acute 10/02/2013   Cancer (HCC)    melanoma excision in 2013   Cholecystitis    Complication of anesthesia    slow to wake up   Depression    Encounter for lipid screening for cardiovascular disease 01/13/2023   Headache    migraine   History of kidney stones 03/2023   had lithotripsy   Mammographic breast lesion 06/28/2017   S/p biopsy, benign    Plantar fasciitis, left 06/05/2019   Pneumonia 2010   Polycythemia, secondary 05/11/2023   PONV (postoperative nausea and vomiting)    Seasonal allergies    STRESS FRACTURE, FOOT 04/08/2009   Qualifier: Diagnosis of  By: Lovell Sheehan MD, Balinda Quails    Vision abnormalities    s/p Lasik    Past Surgical History:  Procedure Laterality Date    CHOLECYSTECTOMY     EXTRACORPOREAL SHOCK WAVE LITHOTRIPSY Right 04/18/2023   Procedure: RIGHT EXTRACORPOREAL SHOCK WAVE LITHOTRIPSY (ESWL);  Surgeon: Jannifer Hick, MD;  Location: [facility];  Service: Urology;  Laterality: Right;   EYE SURGERY Bilateral    PRK   IUD REMOVAL N/A 12/20/2023   Procedure: INTRAUTERINE DEVICE (IUD) REMOVAL;  Surgeon: Clide Cliff, MD;  Location: [facility];  Service: Gynecology;  Laterality: N/A;   Labrum Repair Right 11/15/1998   Open   LAPAROSCOPIC APPENDECTOMY N/A 09/02/2013   Procedure: APPENDECTOMY LAPAROSCOPIC;  Surgeon: Shelly Rubenstein, MD;  Location: [facility];  Service: General;  Laterality: N/A;   ROBOTIC ASSISTED SALPINGO OOPHERECTOMY N/A 12/20/2023   Procedure: XI ROBOTIC ASSISTED LEFT SALPINGO OOPHORECTOMY RIGHT SALPINGECTOMY;  Surgeon: Clide Cliff, MD;  Location: [facility];  Service: Gynecology;  Laterality: N/A;   SHOULDER ARTHROSCOPY Left 11/16/2003   bone spurs    SHOULDER ARTHROSCOPY Right 11/01/2017   Procedure: RIGHT SHOULDER ARTHROSCOPY WITH SUBACROMIAL DECOMPRESSION;  Surgeon: Cammy Copa, MD;  Location: [facility];  Service: Orthopedics;  Laterality: Right;   SHOULDER ARTHROSCOPY WITH SUBACROMIAL DECOMPRESSION, ROTATOR CUFF REPAIR AND BICEP TENDON REPAIR Right 02/24/2016   Procedure: RIGHT SHOULDER ARTHROSCOPY WITH DEBRIDEMENT, BICEPS TENODESIS;  Surgeon: Cammy Copa, MD;  Location: [facility];  Service: Orthopedics;  Laterality: Right;   SHOULDER SURGERY Right 11/15/2005   Revision and repair flap   WISDOM TOOTH EXTRACTION     WRIST ARTHROSCOPY Right 11/09/2017   Procedure: ARTHROSCOPY WRIST  WITH DEBRIDEMENT;  Surgeon: Dairl Ponder, MD;  Location: [facility];  Service: Orthopedics;  Laterality: Right;    Family History  Adopted: Yes  Family history unknown: Yes    Social History   Socioeconomic History   Marital status: Married    Spouse name: Clifton Custard   Number of children: 1   Years of  education: Not on file   Highest education level: Not on file  Occupational History   Occupation: Print production planner  Tobacco Use   Smoking status: Never   Smokeless tobacco: Never  Vaping Use   Vaping status: Never Used  Substance and Sexual Activity   Alcohol use: Yes    Comment: ocassional - social 1- 2 drinks   Drug use: No   Sexual activity: Yes    Birth control/protection: I.U.D.  Other Topics Concern   Not on file  Social History Narrative   Patient lives with husband and one child, a son named Max.    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.    Social Drivers of Corporate investment banker Strain: Not on file  Food Insecurity: No Food Insecurity (12/09/2023)   Hunger Vital Sign    Worried About Running Out of Food in the Last Year: Never true    Ran Out of Food in the Last Year: Never true  Transportation Needs: No Transportation Needs (12/09/2023)   PRAPARE - Administrator, Civil Service (Medical): No    Lack of Transportation (Non-Medical): No  Physical Activity: Not on file  Stress: Not on file  Social Connections: Not on file    Current Medications:  Current Outpatient Medications:    FIBER GUMMIES PO, Take 3 each by mouth daily., Disp: , Rfl:    ibuprofen (ADVIL) 100 MG tablet, Take 400 mg by mouth every 6 (six) hours as needed for fever., Disp: , Rfl:    levocetirizine (XYZAL ALLERGY 24HR) 5 MG tablet, Take 5 mg by mouth every evening., Disp: , Rfl:    mometasone (NASONEX) 50 MCG/ACT nasal spray, Place 2 sprays into the nose daily., Disp: , Rfl:    Multiple Vitamins-Minerals (MULTIPLE VITAMINS/WOMENS PO), Take 1 tablet by mouth daily., Disp: , Rfl:    pantoprazole (PROTONIX) 40 MG tablet, Take 1 tablet (40 mg total) by mouth daily. Take 30 minutes before breakfast., Disp: 90 tablet, Rfl: 1   rizatriptan (MAXALT-MLT) 10 MG disintegrating tablet, DISSOLVE 1 TABLET (10 MG TOTAL) BY MOUTH AS NEEDED. MAY REPEAT IN 2 HOURS  IF NEEDED, Disp: 15 tablet, Rfl: 0   SUMAtriptan 6 MG/0.5ML SOAJ, Inject 6mg  at onset of migraine.  May repeat in 2 hrs, if needed.  Max dose: 2 injections/day. This is a 30 day prescription., Disp: 10 Cartridge, Rfl: 5   tirzepatide (MOUNJARO) 5 MG/0.5ML Pen, Inject 5 mg into the skin once a week., Disp: 2 mL, Rfl: 1   Vibegron (GEMTESA) 75 MG TABS, Take 1 tablet (75 mg total) by mouth daily., Disp: 30 tablet, Rfl: 11  Review of Symptoms: Complete 10-system review is positive for: Constipation, fatigue, abdominal soreness  Physical Exam: BP 116/77 (BP Location: Left Arm, Patient Position: Sitting)   Pulse 67   Temp 97.6 F (36.4 C) (Oral)   Resp 19   Wt 165 lb (74.8 kg)   LMP  (LMP Unknown)   SpO2 96%   BMI 26.63 kg/m  General: Alert, oriented, no acute distress. HEENT: Normocephalic, atraumatic. Neck symmetric without masses.  Sclera anicteric.  Chest: Normal work of breathing. Abdomen: Soft, mildly TTP LUQ incision.  Normoactive bowel sounds.  No masses appreciated.  Well-healing incisions. Extremities: Grossly normal range of motion.  Warm, well perfused.  No edema bilaterally. Skin: No rashes or lesions noted.  Laboratory & Radiologic Studies: Surgical pathology (12/20/23): A. OVARY AND FALLOPIAN TUBE, LEFT, SALPINGO OOPHORECTOMY:       Mucinous cystadenoma with inflammation and necrosis.       Benign fimbriated fallopian tube without significant diagnostic  alteration.  No evidence of borderline features or malignancy.   B. FALLOPIAN TUBE, RIGHT, SALPINGO OOPHORECTOMY:       Benign fimbriated fallopian tube without significant diagnostic  alteration.      Negative for malignancy.  Cytology - negative

## 2024-01-02 NOTE — Patient Instructions (Signed)
It was a pleasure to see you in clinic today. - Okay to reintroduce normal activity. No lifting restrictions - Okay to return to routine care.   Thank you very much for allowing me to provide care for you today.  I appreciate your confidence in choosing our Gynecologic Oncology team at [facility].  If you have any questions about your visit today please call our office or send [place] a MyChart message and we will get back to you as soon as possible.

## 2024-01-04 ENCOUNTER — Telehealth: Payer: 59 | Admitting: Physician Assistant

## 2024-01-04 DIAGNOSIS — R6889 Other general symptoms and signs: Secondary | ICD-10-CM | POA: Diagnosis not present

## 2024-01-04 MED ORDER — OSELTAMIVIR PHOSPHATE 75 MG PO CAPS
75.0000 mg | ORAL_CAPSULE | Freq: Two times a day (BID) | ORAL | 0 refills | Status: DC
  Filled 2024-01-04: qty 10, 5d supply, fill #0

## 2024-01-04 NOTE — Progress Notes (Signed)
 I have spent 5 minutes in review of e-visit questionnaire, review and updating patient chart, medical decision making and response to patient.   Piedad Climes, PA-C

## 2024-01-04 NOTE — Progress Notes (Signed)
 E visit for Flu like symptoms   We are sorry that you are not feeling well.  Here is how we plan to help! Based on what you have shared with me it looks like you may have a respiratory virus that may be influenza.  Influenza or "the flu" is   an infection caused by a respiratory virus. The flu virus is highly contagious and persons who did not receive their yearly flu vaccination may "catch" the flu from close contact.  We have anti-viral medications to treat the viruses that cause this infection. They are not a "cure" and only shorten the course of the infection. These prescriptions are most effective when they are given within the first 2 days of "flu" symptoms. Antiviral medication are indicated if you have a high risk of complications from the flu. You should  also consider an antiviral medication if you are in close contact with someone who is at risk. These medications can help patients avoid complications from the flu  but have side effects that you should know. Possible side effects from Tamiflu or oseltamivir include nausea, vomiting, diarrhea, dizziness, headaches, eye redness, sleep problems or other respiratory symptoms. You should not take Tamiflu if you have an allergy to oseltamivir or any to the ingredients in Tamiflu.  Based upon your symptoms and potential risk factors I have prescribed Oseltamivir (Tamiflu).  It has been sent to your designated pharmacy.  You will take one 75 mg capsule orally twice a day for the next 5 days. and I recommend that you follow the flu symptoms recommendation that I have listed below.  Please keep well-hydrated and try to get plenty of rest. If you have a humidifier, place it in the bedroom and run it at night. Start a saline nasal rinse for nasal congestion. You can consider use of a nasal steroid spray like Flonase or Nasacort OTC. You can alternate between Tylenol and Ibuprofen if needed for fever, body aches, headache and/or throat pain. Salt  water-gargles and chloraseptic spray can be very beneficial for sore throat. Mucinex-DM for congestion or cough. Please take all prescribed medications as directed.  Remain out of work until [employer] for 24 hours without a fever-reducing medication, and you are feeling better.  You should mask until symptoms are resolved.  If anything worsens despite treatment, you need to be evaluated in-person. Please do not delay care.  ANYONE WHO HAS FLU SYMPTOMS SHOULD: Stay home. The flu is highly contagious and going out or to work exposes others! Be sure to drink plenty of fluids. Water is fine as well as fruit juices, sodas and electrolyte beverages. You may want to stay away from caffeine or alcohol. If you are nauseated, try taking small sips of liquids. How do you know if you are getting enough fluid? Your urine should be a pale yellow or almost colorless. Get rest. Taking a steamy shower or using a humidifier may help nasal congestion and ease sore throat pain. Using a saline nasal spray works much the same way. Cough drops, hard candies and sore throat lozenges may ease your cough. Line up a caregiver. Have someone check on you regularly.   GET HELP RIGHT AWAY IF: You cannot keep down liquids or your medications. You become short of breath Your fell like you are going to pass out or loose consciousness. Your symptoms persist after you have completed your treatment plan MAKE SURE YOU  Understand these instructions. Will watch your condition. Will get help right away  if you are not doing well or get worse.  Your e-visit answers were reviewed by a board certified advanced clinical practitioner to complete your personal care plan.  Depending on the condition, your plan could have included both over the counter or prescription medications.  If there is a problem please reply  once you have received a response from your provider.  Your safety is important to [place].  If you have drug allergies  check your prescription carefully.    You can use MyChart to ask questions about today's visit, request a non-urgent call back, or ask for a work or school excuse for 24 hours related to this e-Visit. If it has been greater than 24 hours you will need to follow up with your provider, or enter a new e-Visit to address those concerns.  You will get an e-mail in the next two days asking about your experience.  I hope that your e-visit has been valuable and will speed your recovery. Thank you for using e-visits.

## 2024-01-04 NOTE — Progress Notes (Signed)
 Message sent to patient requesting further input regarding current symptoms. Awaiting patient response.

## 2024-01-05 ENCOUNTER — Other Ambulatory Visit (HOSPITAL_COMMUNITY): Payer: Self-pay

## 2024-01-19 ENCOUNTER — Other Ambulatory Visit (HOSPITAL_COMMUNITY): Payer: Self-pay

## 2024-01-25 DIAGNOSIS — M9902 Segmental and somatic dysfunction of thoracic region: Secondary | ICD-10-CM | POA: Diagnosis not present

## 2024-01-25 DIAGNOSIS — M9901 Segmental and somatic dysfunction of cervical region: Secondary | ICD-10-CM | POA: Diagnosis not present

## 2024-01-25 DIAGNOSIS — M542 Cervicalgia: Secondary | ICD-10-CM | POA: Diagnosis not present

## 2024-01-25 DIAGNOSIS — M9903 Segmental and somatic dysfunction of lumbar region: Secondary | ICD-10-CM | POA: Diagnosis not present

## 2024-01-27 ENCOUNTER — Ambulatory Visit: Payer: 59 | Admitting: Nurse Practitioner

## 2024-01-27 ENCOUNTER — Other Ambulatory Visit (HOSPITAL_COMMUNITY): Payer: Self-pay

## 2024-01-27 ENCOUNTER — Other Ambulatory Visit: Payer: Self-pay | Admitting: Nurse Practitioner

## 2024-01-27 ENCOUNTER — Other Ambulatory Visit: Payer: Self-pay

## 2024-01-27 VITALS — BP 122/78 | HR 77 | Temp 97.6°F | Ht 66.0 in | Wt 158.5 lb

## 2024-01-27 DIAGNOSIS — E669 Obesity, unspecified: Secondary | ICD-10-CM | POA: Diagnosis not present

## 2024-01-27 DIAGNOSIS — M542 Cervicalgia: Secondary | ICD-10-CM | POA: Diagnosis not present

## 2024-01-27 DIAGNOSIS — M9903 Segmental and somatic dysfunction of lumbar region: Secondary | ICD-10-CM | POA: Diagnosis not present

## 2024-01-27 DIAGNOSIS — Z6825 Body mass index (BMI) 25.0-25.9, adult: Secondary | ICD-10-CM

## 2024-01-27 DIAGNOSIS — M9902 Segmental and somatic dysfunction of thoracic region: Secondary | ICD-10-CM | POA: Diagnosis not present

## 2024-01-27 DIAGNOSIS — M9901 Segmental and somatic dysfunction of cervical region: Secondary | ICD-10-CM | POA: Diagnosis not present

## 2024-01-27 MED ORDER — PANTOPRAZOLE SODIUM 40 MG PO TBEC
40.0000 mg | DELAYED_RELEASE_TABLET | Freq: Every day | ORAL | 1 refills | Status: DC
  Filled 2024-01-27: qty 90, 90d supply, fill #0
  Filled 2024-04-25: qty 90, 90d supply, fill #1

## 2024-01-27 MED ORDER — ZEPBOUND 2.5 MG/0.5ML ~~LOC~~ SOLN: 2.5000 mg | SUBCUTANEOUS | 0 refills | Status: DC

## 2024-01-27 NOTE — Assessment & Plan Note (Addendum)
 Chronic Starting weight 234 pounds, starting BMI 38.06 Current weight 158 pounds, current BMI 25.58 She would like to have Zepbound prescribed to her directly through [employer] as her insurance is not cover this medication. Will prescribe Zepbound 2.5 mg weekly injection through [employer]. Patient was advised to reach out to me after completing 3 doses to let me know how she is tolerating medication as well as current weight.  At that point may consider increasing dose.  She reports her understanding.

## 2024-01-27 NOTE — Progress Notes (Signed)
 Established Patient Office Visit  Subjective   Patient ID: Emma Mathews, female    DOB: 20-Jun-1976  Age: 48 y.o. MRN: [id number]  Chief Complaint  Patient presents with   Obesity    Patient arrives to discuss weight loss. She has been using tirzepatide compounded to help her with weight loss, however compounding is going to be discontinued so she is here to discuss possibly being prescribed Zepbound.  She is currently on compounded tirzepatide at 5 mg/week.  She tolerates the medication well.  She started her weight loss journey about 13 months ago and starting weight at that time was 234 pounds with a BMI of 38.06.  Today she weighs 158 pounds with a BMI of 25.58.  She does report constipation a side effect but otherwise has been tolerating the medication well.  She recently underwent gynecological surgery in which both fallopian tubes were removed as well as 1 ovary was removed.  This was for evaluation of a mass which turned out to be benign with potential to turn into malignancy.  Overall she is recovering well from her surgery.     ROS: see HPI    Objective:     BP 122/78   Pulse 77   Temp 97.6 F (36.4 C) (Temporal)   Ht 5\' 6"  (1.676 m)   Wt 158 lb 8 oz (71.9 kg)   SpO2 98%   BMI 25.58 kg/m  BP Readings from Last 3 Encounters:  01/27/24 122/78  01/02/24 116/77  12/20/23 106/69   Wt Readings from Last 3 Encounters:  01/27/24 158 lb 8 oz (71.9 kg)  01/02/24 165 lb (74.8 kg)  12/20/23 163 lb (73.9 kg)      Physical Exam Vitals reviewed.  Constitutional:      General: She is not in acute distress.    Appearance: Normal appearance.  HENT:     Head: Normocephalic and atraumatic.  Neck:     Vascular: No carotid bruit.  Cardiovascular:     Rate and Rhythm: Normal rate and regular rhythm.     Pulses: Normal pulses.     Heart sounds: Normal heart sounds.  Pulmonary:     Effort: Pulmonary effort is normal.     Breath sounds: Normal breath sounds.  Skin:     General: Skin is warm and dry.  Neurological:     General: No focal deficit present.     Mental Status: She is alert and oriented to person, place, and time.  Psychiatric:        Mood and Affect: Mood normal.        Behavior: Behavior normal.        Judgment: Judgment normal.      No results found for any visits on 01/27/24.    The 10-year ASCVD risk score (Arnett DK, et al., 2019) is: 1.2%    Assessment & Plan:   Problem List Items Addressed This Visit       Other   Obesity (BMI 30-39.9) - Primary   Chronic Starting weight 234 pounds, starting BMI 38.06 Current weight 158 pounds, current BMI 25.58 She would like to have Zepbound prescribed to her directly through [employer] as her insurance is not cover this medication. Will prescribe Zepbound 2.5 mg weekly injection through [employer]. Patient was advised to reach out to me after completing 3 doses to let me know how she is tolerating medication as well as current weight.  At that point may consider increasing  dose.  She reports her understanding.      Relevant Medications   tirzepatide (ZEPBOUND) 2.5 MG/0.5ML injection vial    Return in about 7 months (around 08/28/2024) for CPE with Maralyn Sago.    Elenore Paddy, NP

## 2024-02-13 ENCOUNTER — Inpatient Hospital Stay: Payer: 59 | Attending: Hematology and Oncology

## 2024-02-13 ENCOUNTER — Inpatient Hospital Stay: Payer: 59

## 2024-02-13 ENCOUNTER — Inpatient Hospital Stay (HOSPITAL_BASED_OUTPATIENT_CLINIC_OR_DEPARTMENT_OTHER): Payer: 59 | Admitting: Hematology and Oncology

## 2024-02-13 VITALS — BP 116/63 | HR 65 | Resp 16

## 2024-02-13 VITALS — BP 116/61 | HR 67 | Temp 98.2°F | Resp 18 | Ht 66.0 in | Wt 160.6 lb

## 2024-02-13 DIAGNOSIS — L299 Pruritus, unspecified: Secondary | ICD-10-CM | POA: Diagnosis not present

## 2024-02-13 DIAGNOSIS — Z91041 Radiographic dye allergy status: Secondary | ICD-10-CM | POA: Insufficient documentation

## 2024-02-13 DIAGNOSIS — D751 Secondary polycythemia: Secondary | ICD-10-CM | POA: Insufficient documentation

## 2024-02-13 DIAGNOSIS — Z885 Allergy status to narcotic agent status: Secondary | ICD-10-CM | POA: Diagnosis not present

## 2024-02-13 DIAGNOSIS — Z79899 Other long term (current) drug therapy: Secondary | ICD-10-CM | POA: Insufficient documentation

## 2024-02-13 LAB — CBC WITH DIFFERENTIAL (CANCER CENTER ONLY)
Abs Immature Granulocytes: 0.01 10*3/uL (ref 0.00–0.07)
Basophils Absolute: 0 10*3/uL (ref 0.0–0.1)
Basophils Relative: 1 %
Eosinophils Absolute: 0.5 10*3/uL (ref 0.0–0.5)
Eosinophils Relative: 9 %
HCT: 44.2 % (ref 36.0–46.0)
Hemoglobin: 15 g/dL (ref 12.0–15.0)
Immature Granulocytes: 0 %
Lymphocytes Relative: 37 %
Lymphs Abs: 2.1 10*3/uL (ref 0.7–4.0)
MCH: 28.5 pg (ref 26.0–34.0)
MCHC: 33.9 g/dL (ref 30.0–36.0)
MCV: 83.9 fL (ref 80.0–100.0)
Monocytes Absolute: 0.4 10*3/uL (ref 0.1–1.0)
Monocytes Relative: 8 %
Neutro Abs: 2.6 10*3/uL (ref 1.7–7.7)
Neutrophils Relative %: 45 %
Platelet Count: 209 10*3/uL (ref 150–400)
RBC: 5.27 MIL/uL — ABNORMAL HIGH (ref 3.87–5.11)
RDW: 13.9 % (ref 11.5–15.5)
WBC Count: 5.8 10*3/uL (ref 4.0–10.5)
nRBC: 0 % (ref 0.0–0.2)

## 2024-02-13 NOTE — Progress Notes (Signed)
 Faythe Ghee presents today for phlebotomy per MD orders. Phlebotomy procedure started at 1537 and ended at 1553. 502 cc removed via US guided LAC PIV Patient tolerated procedure well observed for 5 min post procedure. Declined to stay for full 30 min observation. Tolerated previous phlebotomy well.  IV needle removed intact.

## 2024-02-13 NOTE — Patient Instructions (Signed)

## 2024-02-13 NOTE — Progress Notes (Signed)
 Patient Care Team: Elenore Paddy, NP as PCP - General (Nurse Practitioner) Ob/Gyn, [facility] as Consulting Physician (Obstetrics and Gynecology)  DIAGNOSIS:  Encounter Diagnosis  Name Primary?   Polycythemia, secondary Yes      CHIEF COMPLIANT: Follow-up of polycythemia, complains of itching  HISTORY OF PRESENT ILLNESS: Emma Mathews is a 48 year old with above-mentioned history of secondary polycythemia.  She does profound itching and the phlebotomy appears to resolve that itching sensation.  She had a total of 2 episodes of phlebotomy and today she is here for another phlebotomy treatment.  It appears that last month she had 2 episodes of severe itching.  She is uncertain if the itching is related to hot weather or lack of phlebotomy treatments.  We have not done a phlebotomy since August last year.    ALLERGIES:  is allergic to diphenhydramine hcl, diphenhydramine hcl, iodinated contrast media, chlorhexidine gluconate, and codeine.  MEDICATIONS:  Current Outpatient Medications  Medication Sig Dispense Refill   FIBER GUMMIES PO Take 3 each by mouth daily.     levocetirizine (XYZAL ALLERGY 24HR) 5 MG tablet Take 5 mg by mouth every evening.     mometasone (NASONEX) 50 MCG/ACT nasal spray Place 2 sprays into the nose daily.     Multiple Vitamins-Minerals (MULTIPLE VITAMINS/WOMENS PO) Take 1 tablet by mouth daily.     pantoprazole (PROTONIX) 40 MG tablet Take 1 tablet (40 mg total) by mouth daily. Take 30 minutes before breakfast. 90 tablet 1   rizatriptan (MAXALT-MLT) 10 MG disintegrating tablet DISSOLVE 1 TABLET (10 MG TOTAL) BY MOUTH AS NEEDED. MAY REPEAT IN 2 HOURS IF NEEDED 15 tablet 0   SUMAtriptan 6 MG/0.5ML SOAJ Inject 6mg  at onset of migraine.  May repeat in 2 hrs, if needed.  Max dose: 2 injections/day. This is a 30 day prescription. 10 Cartridge 5   tirzepatide (ZEPBOUND) 2.5 MG/0.5ML injection vial Inject 2.5 mg into the skin once a week. 2 mL 0   Vibegron (GEMTESA) 75  MG TABS Take 1 tablet (75 mg total) by mouth daily. 30 tablet 11   No current facility-administered medications for this visit.    PHYSICAL EXAMINATION: ECOG PERFORMANCE STATUS: 1 - Symptomatic but completely ambulatory  Vitals:   02/13/24 1428  BP: 116/61  Pulse: 67  Resp: 18  Temp: 98.2 F (36.8 C)  SpO2: 100%   Filed Weights   02/13/24 1428  Weight: 160 lb 9.6 oz (72.8 kg)      LABORATORY DATA:  I have reviewed the data as listed    Latest Ref Rng & Units 12/14/2023    8:30 AM 07/08/2023    8:52 AM 04/20/2023    7:46 AM  CMP  Glucose 70 - 99 mg/dL 80  85  96   BUN 6 - 20 mg/dL 17  14  12    Creatinine 0.44 - 1.00 mg/dL 1.61  0.96  0.45   Sodium 135 - 145 mmol/L 137  140  139   Potassium 3.5 - 5.1 mmol/L 4.3  4.0  3.7   Chloride 98 - 111 mmol/L 106  106  106   CO2 22 - 32 mmol/L 23  26  24    Calcium 8.9 - 10.3 mg/dL 9.6  9.6  9.5   Total Protein 6.5 - 8.1 g/dL 7.8  7.8  8.4   Total Bilirubin 0.0 - 1.2 mg/dL 0.9  0.8  0.7   Alkaline Phos 38 - 126 U/L 58  70  64   AST  15 - 41 U/L 15  14  20    ALT 0 - 44 U/L 11  10  19      Lab Results  Component Value Date   WBC 5.8 02/13/2024   HGB 15.0 02/13/2024   HCT 44.2 02/13/2024   MCV 83.9 02/13/2024   PLT 209 02/13/2024   NEUTROABS 2.6 02/13/2024    ASSESSMENT & PLAN:  Polycythemia, secondary Profound itching of the extremities with exposure to heat (especially hot water or hot weather)   Lab review: 01/13/2023: Hemoglobin 15.7, hematocrit 46.2 05/03/2023: Hemoglobin 15.5, hematocrit 46.6 05/17/2023: MPN panel: Normal, erythropoietin 6.4, hemoglobin 15.6 02/13/2024: Hemoglobin 15, hematocrit 44,   Phlebotomy: Started 05/17/2023 (only done 3 times) 12/20/2023: Robotic left salpingo-oophorectomy, right salpingectomy IUD removal: Mucinous cystadenoma with inflammation  MPN panel as well as erythropoietin are both normal. Itching resolved with phlebotomy. The other potential cause of itching could be the hot weather. I  recommended proceeding with phlebotomy at this time.  If in spite of the phlebotomy she gets itching then we can attribute most of it to hot weather and we do not have to do as often phlebotomy treatments.  Recheck labs in 4 months and decide if she needs a phlebotomy then.     No orders of the defined types were placed in this encounter.  The patient has a good understanding of the overall plan. she agrees with it. she will call with any problems that may develop before the next visit here. Total time spent: 30 mins including face to face time and time spent for planning, charting and co-ordination of care   Tamsen Meek, MD 02/13/24

## 2024-02-13 NOTE — Assessment & Plan Note (Signed)
 Profound itching of the extremities with exposure to heat (especially hot water or hot weather)   Lab review: 01/13/2023: Hemoglobin 15.7, hematocrit 46.2 05/03/2023: Hemoglobin 15.5, hematocrit 46.6 05/17/2023: MPN panel: Normal, erythropoietin 6.4, hemoglobin 15.6   Phlebotomy: Started 05/17/2023 (every 2 months)   MPN panel as well as erythropoietin are both normal. Itching resolved with phlebotomy. Patient is unclear if the resolution of itching was due to phlebotomy versus 70 pound weight loss (since starting [facility])  12/20/2023: Robotic left salpingo-oophorectomy, right salpingectomy IUD removal: Mucinous cystadenoma with inflammation

## 2024-02-14 ENCOUNTER — Telehealth: Payer: Self-pay | Admitting: Hematology and Oncology

## 2024-02-14 NOTE — Telephone Encounter (Signed)
 Scheduled appt for pt, left a voice mail of date and time of appt

## 2024-02-16 ENCOUNTER — Encounter: Payer: Self-pay | Admitting: Nurse Practitioner

## 2024-02-17 ENCOUNTER — Other Ambulatory Visit: Payer: Self-pay | Admitting: Nurse Practitioner

## 2024-02-17 DIAGNOSIS — E669 Obesity, unspecified: Secondary | ICD-10-CM

## 2024-02-17 MED ORDER — ZEPBOUND 5 MG/0.5ML ~~LOC~~ SOLN: 5.0000 mg | SUBCUTANEOUS | 3 refills | Status: DC

## 2024-03-01 ENCOUNTER — Telehealth: Admitting: Physician Assistant

## 2024-03-01 DIAGNOSIS — R3989 Other symptoms and signs involving the genitourinary system: Secondary | ICD-10-CM

## 2024-03-02 ENCOUNTER — Other Ambulatory Visit (HOSPITAL_COMMUNITY): Payer: Self-pay

## 2024-03-02 MED ORDER — NITROFURANTOIN MONOHYD MACRO 100 MG PO CAPS
100.0000 mg | ORAL_CAPSULE | Freq: Two times a day (BID) | ORAL | 0 refills | Status: DC
  Filled 2024-03-02: qty 10, 5d supply, fill #0

## 2024-03-02 NOTE — Progress Notes (Signed)

## 2024-03-07 DIAGNOSIS — L814 Other melanin hyperpigmentation: Secondary | ICD-10-CM | POA: Diagnosis not present

## 2024-03-07 DIAGNOSIS — L089 Local infection of the skin and subcutaneous tissue, unspecified: Secondary | ICD-10-CM | POA: Diagnosis not present

## 2024-03-07 DIAGNOSIS — Z85820 Personal history of malignant melanoma of skin: Secondary | ICD-10-CM | POA: Diagnosis not present

## 2024-03-07 DIAGNOSIS — L910 Hypertrophic scar: Secondary | ICD-10-CM | POA: Diagnosis not present

## 2024-03-07 DIAGNOSIS — D229 Melanocytic nevi, unspecified: Secondary | ICD-10-CM | POA: Diagnosis not present

## 2024-03-07 DIAGNOSIS — L578 Other skin changes due to chronic exposure to nonionizing radiation: Secondary | ICD-10-CM | POA: Diagnosis not present

## 2024-03-08 ENCOUNTER — Other Ambulatory Visit (HOSPITAL_COMMUNITY): Payer: Self-pay

## 2024-03-08 DIAGNOSIS — L814 Other melanin hyperpigmentation: Secondary | ICD-10-CM | POA: Diagnosis not present

## 2024-03-08 DIAGNOSIS — D229 Melanocytic nevi, unspecified: Secondary | ICD-10-CM | POA: Diagnosis not present

## 2024-03-08 DIAGNOSIS — L578 Other skin changes due to chronic exposure to nonionizing radiation: Secondary | ICD-10-CM | POA: Diagnosis not present

## 2024-03-08 DIAGNOSIS — L910 Hypertrophic scar: Secondary | ICD-10-CM | POA: Diagnosis not present

## 2024-03-08 DIAGNOSIS — L089 Local infection of the skin and subcutaneous tissue, unspecified: Secondary | ICD-10-CM | POA: Diagnosis not present

## 2024-03-08 DIAGNOSIS — Z85820 Personal history of malignant melanoma of skin: Secondary | ICD-10-CM | POA: Diagnosis not present

## 2024-03-08 MED ORDER — TRETINOIN 0.025 % EX CREA
TOPICAL_CREAM | CUTANEOUS | 2 refills | Status: AC
Start: 2024-03-08 — End: ?
  Filled 2024-03-08: qty 45, 30d supply, fill #0

## 2024-03-09 ENCOUNTER — Encounter: Payer: Self-pay | Admitting: Nurse Practitioner

## 2024-03-12 ENCOUNTER — Ambulatory Visit: Admitting: Orthopedic Surgery

## 2024-03-12 ENCOUNTER — Other Ambulatory Visit (HOSPITAL_COMMUNITY): Payer: Self-pay

## 2024-03-12 MED ORDER — PROGESTERONE 200 MG PO CAPS
200.0000 mg | ORAL_CAPSULE | Freq: Every day | ORAL | 4 refills | Status: DC
  Filled 2024-03-12: qty 30, 30d supply, fill #0
  Filled 2024-04-10: qty 30, 30d supply, fill #1
  Filled 2024-04-25 – 2024-05-15 (×2): qty 30, 30d supply, fill #2
  Filled 2024-06-14: qty 30, 30d supply, fill #3
  Filled 2024-07-14: qty 30, 30d supply, fill #4

## 2024-03-12 MED ORDER — ESTRADIOL 0.025 MG/24HR TD PTTW
1.0000 | MEDICATED_PATCH | TRANSDERMAL | 3 refills | Status: DC
Start: 2024-03-12 — End: 2024-06-22
  Filled 2024-03-12: qty 8, 28d supply, fill #0
  Filled 2024-04-05: qty 8, 28d supply, fill #1
  Filled 2024-04-25 – 2024-04-26 (×2): qty 8, 28d supply, fill #2
  Filled 2024-04-27: qty 8, 28d supply, fill #0
  Filled 2024-05-15 – 2024-05-31 (×3): qty 8, 28d supply, fill #1

## 2024-03-17 ENCOUNTER — Other Ambulatory Visit: Payer: Self-pay

## 2024-03-17 ENCOUNTER — Emergency Department (HOSPITAL_BASED_OUTPATIENT_CLINIC_OR_DEPARTMENT_OTHER)

## 2024-03-17 ENCOUNTER — Emergency Department (HOSPITAL_BASED_OUTPATIENT_CLINIC_OR_DEPARTMENT_OTHER)
Admission: EM | Admit: 2024-03-17 | Discharge: 2024-03-17 | Disposition: A | Discharge status: Home / Self Care | Attending: Emergency Medicine | Admitting: Emergency Medicine

## 2024-03-17 ENCOUNTER — Encounter (HOSPITAL_BASED_OUTPATIENT_CLINIC_OR_DEPARTMENT_OTHER): Payer: Self-pay | Admitting: Emergency Medicine

## 2024-03-17 DIAGNOSIS — R519 Headache, unspecified: Secondary | ICD-10-CM | POA: Diagnosis not present

## 2024-03-17 DIAGNOSIS — R42 Dizziness and giddiness: Secondary | ICD-10-CM | POA: Insufficient documentation

## 2024-03-17 DIAGNOSIS — R112 Nausea with vomiting, unspecified: Secondary | ICD-10-CM | POA: Insufficient documentation

## 2024-03-17 DIAGNOSIS — R002 Palpitations: Secondary | ICD-10-CM | POA: Diagnosis not present

## 2024-03-17 LAB — COMPREHENSIVE METABOLIC PANEL WITH GFR
ALT: 11 U/L (ref 0–44)
AST: 21 U/L (ref 15–41)
Albumin: 4.6 g/dL (ref 3.5–5.0)
Alkaline Phosphatase: 73 U/L (ref 38–126)
Anion gap: 14 (ref 5–15)
BUN: 17 mg/dL (ref 6–20)
CO2: 23 mmol/L (ref 22–32)
Calcium: 9.9 mg/dL (ref 8.9–10.3)
Chloride: 101 mmol/L (ref 98–111)
Creatinine, Ser: 0.75 mg/dL (ref 0.44–1.00)
GFR, Estimated: >60 mL/min (ref >60)
Glucose, Bld: 95 mg/dL (ref 70–99)
Potassium: 3.9 mmol/L (ref 3.5–5.1)
Sodium: 138 mmol/L (ref 135–145)
Total Bilirubin: 0.4 mg/dL (ref 0.0–1.2)
Total Protein: 7.6 g/dL (ref 6.5–8.1)

## 2024-03-17 LAB — TROPONIN T, HIGH SENSITIVITY
Troponin T High Sensitivity: <15 ng/L (ref <19)
Troponin T High Sensitivity: <15 ng/L (ref <19)

## 2024-03-17 LAB — CBC
HCT: 38.5 % (ref 36.0–46.0)
Hemoglobin: 13.4 g/dL (ref 12.0–15.0)
MCH: 29.3 pg (ref 26.0–34.0)
MCHC: 34.8 g/dL (ref 30.0–36.0)
MCV: 84.1 fL (ref 80.0–100.0)
Platelets: 216 10*3/uL (ref 150–400)
RBC: 4.58 MIL/uL (ref 3.87–5.11)
RDW: 13.1 % (ref 11.5–15.5)
WBC: 8.5 10*3/uL (ref 4.0–10.5)
nRBC: 0 % (ref 0.0–0.2)

## 2024-03-17 LAB — URINALYSIS, ROUTINE W REFLEX MICROSCOPIC
Bilirubin Urine: NEGATIVE
Glucose, UA: NEGATIVE mg/dL
Hgb urine dipstick: NEGATIVE
Ketones, ur: 40 mg/dL — AB
Leukocytes,Ua: NEGATIVE
Nitrite: NEGATIVE
Specific Gravity, Urine: 1.022 (ref 1.005–1.030)
pH: 8 (ref 5.0–8.0)

## 2024-03-17 LAB — TSH: TSH: 0.929 u[IU]/mL (ref 0.350–4.500)

## 2024-03-17 LAB — RESP PANEL BY RT-PCR (RSV, FLU A&B, COVID)  RVPGX2
Influenza A by PCR: NEGATIVE
Influenza B by PCR: NEGATIVE
Resp Syncytial Virus by PCR: NEGATIVE
SARS Coronavirus 2 by RT PCR: NEGATIVE

## 2024-03-17 LAB — D-DIMER, QUANTITATIVE: D-Dimer, Quant: 0.6 ug{FEU}/mL — ABNORMAL HIGH (ref 0.00–0.50)

## 2024-03-17 LAB — PREGNANCY, URINE: Preg Test, Ur: NEGATIVE

## 2024-03-17 LAB — T4, FREE: Free T4: 1.13 ng/dL — ABNORMAL HIGH (ref 0.61–1.12)

## 2024-03-17 MED ORDER — DIPHENHYDRAMINE HCL 50 MG/ML IJ SOLN
12.5000 mg | Freq: Once | INTRAMUSCULAR | Status: AC
  Administered 2024-03-17: 12.5 mg via INTRAVENOUS
  Filled 2024-03-17: qty 1

## 2024-03-17 MED ORDER — METOCLOPRAMIDE HCL 5 MG/ML IJ SOLN
10.0000 mg | Freq: Once | INTRAMUSCULAR | Status: AC
  Administered 2024-03-17: 10 mg via INTRAVENOUS
  Filled 2024-03-17: qty 2

## 2024-03-17 MED ORDER — SODIUM CHLORIDE 0.9 % IV BOLUS
1000.0000 mL | Freq: Once | INTRAVENOUS | Status: AC
  Administered 2024-03-17: 1000 mL via INTRAVENOUS

## 2024-03-17 MED ORDER — ACETAMINOPHEN 500 MG PO TABS
1000.0000 mg | ORAL_TABLET | Freq: Once | ORAL | Status: AC
  Administered 2024-03-17: 1000 mg via ORAL
  Filled 2024-03-17: qty 2

## 2024-03-17 NOTE — ED Provider Notes (Signed)
 [place] EMERGENCY DEPARTMENT AT [facility] Provider Note   CSN: [id number] Arrival date & time: 03/17/24  0158     History  Chief Complaint  Patient presents with   Dizziness    Emma Mathews is a 48 y.o. female.   Dizziness Associated symptoms: headaches, nausea, palpitations and vomiting   Associated symptoms: no chest pain and no shortness of breath      48 year old female with medical history significant for migraine headaches, anxiety, depression, currently on Mounjaro  presenting to the emergency department with nausea, vomiting, lightheadedness, heart palpitations, headache.  The patient was at a wedding earlier this evening and had 3 alcoholic beverages.  Following this, she had sudden onset nausea, vomiting and heart palpitations.  She denied any chest pain, shortness of breath.  After episode of vomiting, she developed a headache, described as left-sided and throbbing.  She has a history of migraine headaches.  Headache is not worst headache of her life and is left-sided and throbbing, associated nausea and vomiting.  She endorses some sensitivity to light.  No syncope.  Home Medications Prior to Admission medications   Medication Sig Start Date End Date Taking? Authorizing Provider  estradiol  (VIVELLE -DOT) 0.025 MG/24HR Place 1 patch onto the skin 2 (two) times a week. 03/12/24     FIBER GUMMIES PO Take 3 each by mouth daily.    [provider]  levocetirizine (XYZAL ALLERGY 24HR) 5 MG tablet Take 5 mg by mouth every evening.    [provider]  mometasone  (NASONEX ) 50 MCG/ACT nasal spray Place 2 sprays into the nose daily.    [provider]  Multiple Vitamins-Minerals (MULTIPLE VITAMINS/WOMENS PO) Take 1 tablet by mouth daily.    [provider]  nitrofurantoin , macrocrystal-monohydrate, (MACROBID ) 100 MG capsule Take 1 capsule (100 mg total) by mouth 2 (two) times daily. 03/02/24   Angelia Kelp, PA-C   pantoprazole  (PROTONIX ) 40 MG tablet Take 1 tablet (40 mg total) by mouth daily. Take 30 minutes before breakfast. 01/27/24   Kennedy-Smith, Colleen M, NP  progesterone  (PROMETRIUM ) 200 MG capsule Take 1 capsule (200 mg total) by mouth daily. 03/12/24     rizatriptan  (MAXALT -MLT) 10 MG disintegrating tablet DISSOLVE 1 TABLET (10 MG TOTAL) BY MOUTH AS NEEDED. MAY REPEAT IN 2 HOURS IF NEEDED 09/12/19   Phebe Brasil, MD  SUMAtriptan  6 MG/0.5ML SOAJ Inject 6mg  at onset of migraine.  May repeat in 2 hrs, if needed.  Max dose: 2 injections/day. This is a 30 day prescription. 03/31/18   Phebe Brasil, MD  tirzepatide  (ZEPBOUND ) 5 MG/0.5ML injection vial Inject 5 mg into the skin once a week. 02/17/24   Zorita Hiss, NP  tretinoin  (RETIN-A ) 0.025 % cream Apply a small amount at night time to face and neck , twice a week. Increase according to tolerance up to daily 03/08/24     Vibegron  (GEMTESA ) 75 MG TABS Take 1 tablet (75 mg total) by mouth daily. 06/01/23         Allergies    Diphenhydramine  hcl, Diphenhydramine  hcl, Iodinated contrast media, Chlorhexidine  gluconate, and Codeine    Review of Systems   Review of Systems  Respiratory:  Negative for shortness of breath.   Cardiovascular:  Positive for palpitations. Negative for chest pain.  Gastrointestinal:  Positive for nausea and vomiting.  Neurological:  Positive for light-headedness and headaches.  All other systems reviewed and are negative.   Physical Exam Updated Vital Signs BP 95/60 (BP Location: Right Arm)  Pulse 91   Temp 97.9 F (36.6 C) (Oral)   Resp 16   Ht 5\' 6"  (1.676 m)   Wt 68.5 kg   SpO2 100%   BMI 24.37 kg/m  Physical Exam Vitals and nursing note reviewed.  Constitutional:      General: She is not in acute distress.    Appearance: She is well-developed.  HENT:     Head: Normocephalic and atraumatic.  Eyes:     Conjunctiva/sclera: Conjunctivae normal.  Cardiovascular:     Rate and Rhythm: Normal rate and regular rhythm.   Pulmonary:     Effort: Pulmonary effort is normal. No respiratory distress.     Breath sounds: Normal breath sounds.  Abdominal:     Palpations: Abdomen is soft.     Tenderness: There is no abdominal tenderness.  Musculoskeletal:        General: No swelling.     Cervical back: Neck supple.  Skin:    General: Skin is warm and dry.     Capillary Refill: Capillary refill takes less than 2 seconds.  Neurological:     Mental Status: She is alert.     Comments: MENTAL STATUS EXAM:    Orientation: Alert and oriented to person, place and time.  Memory: Cooperative, follows commands well.  Language: Speech is clear and language is normal.   CRANIAL NERVES:    CN 2 (Optic): Visual fields intact to confrontation.  CN 3,4,6 (EOM): Pupils equal and reactive to light. Full extraocular eye movement without nystagmus.  CN 5 (Trigeminal): Facial sensation is normal, no weakness of masticatory muscles.  CN 7 (Facial): No facial weakness or asymmetry.  CN 8 (Auditory): Auditory acuity grossly normal.  CN 9,10 (Glossophar): The uvula is midline, the palate elevates symmetrically.  CN 11 (spinal access): Normal sternocleidomastoid and trapezius strength.  CN 12 (Hypoglossal): The tongue is midline. No atrophy or fasciculations.Aaron Aas   MOTOR:  Muscle Strength: 5/5RUE, 5/5LUE, 5/5RLE, 5/5LLE.   COORDINATION:   No tremor.   SENSATION:   Intact to light touch all four extremities.  GAIT: Gait not assessed   Psychiatric:        Mood and Affect: Mood normal.     ED Results / Procedures / Treatments   Labs (all labs ordered are listed, but only abnormal results are displayed) Labs Reviewed  URINALYSIS, ROUTINE W REFLEX MICROSCOPIC - Abnormal; Notable for the following components:      Result Value   Ketones, ur 40 (*)    Protein, ur TRACE (*)    All other components within normal limits  D-DIMER, QUANTITATIVE (NOT AT [facility]) - Abnormal; Notable for the following components:   D-Dimer, Quant 0.60  (*)    All other components within normal limits  T4, FREE - Abnormal; Notable for the following components:   Free T4 1.13 (*)    All other components within normal limits  RESP PANEL BY RT-PCR (RSV, FLU A&B, COVID)  RVPGX2  COMPREHENSIVE METABOLIC PANEL WITH GFR  CBC  PREGNANCY, URINE  TSH  TROPONIN T, HIGH SENSITIVITY  TROPONIN T, HIGH SENSITIVITY    EKG EKG Interpretation Date/Time:  Saturday Mar 17 2024 02:17:44 EDT Ventricular Rate:  85 PR Interval:  146 QRS Duration:  80 QT Interval:  340 QTC Calculation: 404 R Axis:   73  Text Interpretation: Normal sinus rhythm Normal ECG When compared with ECG of 23-Jul-2003 02:26, Nonspecific T wave abnormality no longer evident in Inferior leads Confirmed by Rosealee Concha ([id number]) on  03/17/2024 3:42:43 AM  Radiology CT Head Wo Contrast Result Date: 03/17/2024 CLINICAL DATA:  Sudden onset headache. EXAM: CT HEAD WITHOUT CONTRAST TECHNIQUE: Contiguous axial images were obtained from the base of the skull through the vertex without intravenous contrast. RADIATION DOSE REDUCTION: This exam was performed according to the departmental dose-optimization program which includes automated exposure control, adjustment of the mA and/or kV according to patient size and/or use of iterative reconstruction technique. COMPARISON:  None Available. FINDINGS: Brain: There is no evidence for acute hemorrhage, hydrocephalus, mass lesion, or abnormal extra-axial fluid collection. No definite CT evidence for acute infarction. Vascular: No hyperdense vessel or unexpected calcification. Skull: No evidence for fracture. No worrisome lytic or sclerotic lesion. Sinuses/Orbits: Chronic mucosal thickening noted in the maxillary and ethmoid sinuses. Left frontal sinus is opacified. Visualized portions of the globes and intraorbital fat are unremarkable. Other: None. IMPRESSION: 1. No acute intracranial abnormality. 2. Chronic paranasal sinus disease. Electronically Signed   By:  Donnal Fusi M.D.   On: 03/17/2024 05:23    Procedures Procedures    Medications Ordered in ED Medications  metoCLOPramide  (REGLAN ) injection 10 mg (10 mg Intravenous Given 03/17/24 0518)  diphenhydrAMINE  (BENADRYL ) injection 12.5 mg (12.5 mg Intravenous Given 03/17/24 0518)  sodium chloride  0.9 % bolus 1,000 mL (0 mLs Intravenous Stopped 03/17/24 0600)  acetaminophen  (TYLENOL ) tablet 1,000 mg (1,000 mg Oral Given 03/17/24 2841)    ED Course/ Medical Decision Making/ A&P                                 Medical Decision Making Amount and/or Complexity of Data Reviewed Labs: ordered. Radiology: ordered.  Risk OTC drugs. Prescription drug management.    48 year old female with medical history significant for migraine headaches, anxiety, depression, currently on Mounjaro  presenting to the emergency department with nausea, vomiting, lightheadedness, heart palpitations, headache.  The patient was at a wedding earlier this evening and had 3 alcoholic beverages.  Following this, she had sudden onset nausea, vomiting and heart palpitations.  She denied any chest pain, shortness of breath.  After episode of vomiting, she developed a headache, described as left-sided and throbbing.  She has a history of migraine headaches.  Headache is not worst headache of her life and is left-sided and throbbing, associated nausea and vomiting.  She endorses some sensitivity to light.  No syncope.  On arrival, the patient was afebrile, not tachycardic or tachypneic, hemodynamically stable, saturating 100% on room air.  Physical exam revealed a normal neurologic exam, lungs CTAB, no acute abnormalities noted.  Patient presentation concerning for nausea and vomiting in the setting of alcoholic beverage consumption and according to the patient "potentially over eating."  She is on Mounjaro .  She developed heart palpitations during this time.  She denies any chest pain or shortness of breath.  After the episode of  vomiting she experienced a sudden onset headache, described as left-sided and throbbing similar to previous headaches however it was sudden in onset and after vomiting.  She endorsed light sensitivity and sound sensitivity.  My primary concern is nausea and vomiting in setting of Mounjaro  use, possible migraine headache.    Differential diagnosis includes: Intracranial aneurysm, intracranial bleed, mass lesion, meningitis, temporal arteritis, stroke, cluster headache, idiopathic intracranial hypertension, cavernous sinus thrombosis, carbon monoxide toxicity, herpes zoster, carotid or vertebral artery dissection, or acute angle close glaucoma.  CT head was obtained which was without acute intracranial abnormality.  It was  obtained greater than 6 hours after initial onset of symptoms.  The patient was administered a migraine cocktail with subsequent resolution in her symptoms.  She was fluid resuscitated with a fluid bolus.  An EKG revealed sinus rhythm, ventricular rate 85, no acute ischemic changes, no abnormal intervals.   Laboratory evaluation revealed urinalysis negative for UTI, CBC without a leukocytosis or anemia, urine pregnancy negative, initial cardiac troponin normal, TSH normal, COVID, flu, RSV PCR testing negative, CMP unremarkable.  A D-dimer was elevated at 0.6.  The patient has no symptoms of PE however she did have palpitations and lightheadedness and nausea.  I discussed further diagnostic testing to include CTA PE study or VQ study and CTA Head versus LP given the patient's presentation.  The patient does have a contrast allergy and would require premedication.  After discussing the risks and benefits, the patient would prefer to not undergo further diagnostic testing at this time.  I explained to the patient that I could not fully rule out pulmonary embolism as a cause of her presentation and I could not rule out intracranial hemorrhage such as subarachnoid hemorrhage in the setting of  her sudden onset headache.  I have lower suspicion for ACS, reassuring EKG and initial troponin and the patient denies CP or SOB.  However the workup is incomplete at this time to fully rule out other life-threatening etiologies of the patient's presentation.  Despite the discussion of risks and benefits, the patient elected to sign out AGAINST MEDICAL ADVICE.   DC Instructions: Your laboratory evaluation was generally unremarkable with the exception of an elevated D-dimer.  A D-dimer is an inflammatory marker and is only a really useful test when it is negative and ruling out a blood clot.  Given your contrast allergy, you were offered premedication and further diagnostic testing with CT angiogram to rule out a blood clot given your symptoms and presentation.  This testing was declined and you have since elected to sign out AGAINST MEDICAL ADVICE.  Regarding your headache, symptoms certainly could be due to a migraine headache.  Your CT imaging was negative however it was not performed within the 6-hour window of onset to fully rule out a sentinel bleed in your brain.  Further diagnostic testing was offered to include lumbar puncture and CT angiogram of the head and neck to further evaluate your symptoms however after discussion of the risk and benefits you have elected to forego further diagnostic testing and have elected to sign out AGAINST MEDICAL ADVICE.  Recommend that you follow-up outpatient with your neurologist regarding your headaches.  Return for any syncope, sudden onset thunderclap headache, maximal in intensity worst headache of life, new onset chest pain, shortness of breath, or any other concerns.   Final Clinical Impression(s) / ED Diagnoses Final diagnoses:  Acute nonintractable headache, unspecified headache type  Nausea and vomiting, unspecified vomiting type  Palpitations  Lightheadedness    Rx / DC Orders ED Discharge Orders     None         Rosealee Concha, MD 03/17/24  2301

## 2024-03-17 NOTE — Discharge Instructions (Addendum)
 Your laboratory evaluation was generally unremarkable with the exception of an elevated D-dimer.  A D-dimer is an inflammatory marker and is only a really useful test when it is negative and ruling out a blood clot.  Given your contrast allergy, you were offered premedication and further diagnostic testing with CT angiogram to rule out a blood clot given your symptoms and presentation.  This testing was declined and you have since elected to sign out AGAINST MEDICAL ADVICE.  Regarding your headache, symptoms certainly could be due to a migraine headache.  Your CT imaging was negative however it was not performed within the 6-hour window of onset to fully rule out a sentinel bleed in your brain.  Further diagnostic testing was offered to include lumbar puncture and CT angiogram of the head and neck to further evaluate your symptoms however after discussion of the risk and benefits you have elected to forego further diagnostic testing and have elected to sign out AGAINST MEDICAL ADVICE.  Recommend that you follow-up outpatient with your neurologist regarding your headaches.  Return for any syncope, sudden onset thunderclap headache, maximal in intensity worst headache of life, new onset chest pain, shortness of breath, or any other concerns.

## 2024-03-17 NOTE — ED Triage Notes (Addendum)
 Patient reports headache, dizziness, nausea, and heart palpitations that started this evening. Denies diarrhea. Denies chest pain.

## 2024-03-19 ENCOUNTER — Telehealth: Payer: Self-pay

## 2024-03-19 NOTE — Transitions of Care (Post Inpatient/ED Visit) (Signed)
 03/19/2024  Name: AMI CAYTON MRN: [id number] DOB: 05-Jan-1976  Today's TOC FU Call Status:   Patient's Name and Date of Birth confirmed.  Transition Care Management Follow-up Telephone Call Date of Discharge: 03/17/24 Discharge Facility: [facility] ([facility]) Type of Discharge: Emergency Department Reason for ED Visit: Other:, Neurologic (headache) How have you been since you were released from the hospital?: Better Any questions or concerns?: No  Items Reviewed: Did you receive and understand the discharge instructions provided?: Yes Any new allergies since your discharge?: No Dietary orders reviewed?: NA Do you have support at home?: Yes People in Home [RPT]: spouse  Medications Reviewed Today: Medications Reviewed Today     Reviewed by Christa Course, CMA (Certified Medical Assistant) on 03/19/24 at 1328  Med List Status: <None>   Medication Order Taking? Sig Documenting Provider Last Dose Status Informant  estradiol  (VIVELLE -DOT) 0.025 MG/24HR [id number] Yes Place 1 patch onto the skin 2 (two) times a week.  Taking Active   FIBER GUMMIES PO [id number] Yes Take 3 each by mouth daily. [provider] Taking Active Self  levocetirizine (XYZAL ALLERGY 24HR) 5 MG tablet [id number] Yes Take 5 mg by mouth every evening. [provider] Taking Active Self  mometasone  (NASONEX ) 50 MCG/ACT nasal spray [id number] Yes Place 2 sprays into the nose daily. [provider] Taking Active Self  Multiple Vitamins-Minerals (MULTIPLE VITAMINS/WOMENS PO) [id number] Yes Take 1 tablet by mouth daily. [provider] Taking Active Self  nitrofurantoin , macrocrystal-monohydrate, (MACROBID ) 100 MG capsule [id number] No Take 1 capsule (100 mg total) by mouth 2 (two) times daily.  Patient not taking: Reported on 03/19/2024   Angelia Kelp, PA-C Not Taking Active   pantoprazole  (PROTONIX ) 40 MG tablet [id number] Yes Take 1 tablet (40 mg total) by mouth daily.  Take 30 minutes before breakfast. Kennedy-Smith, Colleen M, NP Taking Active   progesterone  (PROMETRIUM ) 200 MG capsule [id number] Yes Take 1 capsule (200 mg total) by mouth daily.  Taking Active   rizatriptan  (MAXALT -MLT) 10 MG disintegrating tablet [id number] Yes DISSOLVE 1 TABLET (10 MG TOTAL) BY MOUTH AS NEEDED. MAY REPEAT IN 2 HOURS IF NEEDED Phebe Brasil, MD Taking Active Self           Med Note Mauri Sous Dec 12, 2023  2:05 PM)    SUMAtriptan  6 MG/0.5ML [place] [id number] Yes Inject 6mg  at onset of migraine.  May repeat in 2 hrs, if needed.  Max dose: 2 injections/day. This is a 30 day prescription. Phebe Brasil, MD Taking Active Self  tirzepatide  (ZEPBOUND ) 5 MG/0.5ML injection vial [id number] Yes Inject 5 mg into the skin once a week. Zorita Hiss, NP Taking Active   tretinoin  (RETIN-A ) 0.025 % cream [id number] Yes Apply a small amount at night time to face and neck , twice a week. Increase according to tolerance up to daily  Taking Active   Vibegron  (GEMTESA ) 75 MG TABS [id number] Yes Take 1 tablet (75 mg total) by mouth daily.  Taking Active Self            Home Care and Equipment/Supplies: Were Home Health Services Ordered?: NA Any new equipment or medical supplies ordered?: NA  Functional Questionnaire: Do you need assistance with bathing/showering or dressing?: No Do you need assistance with meal preparation?: No Do you need assistance with eating?: No Do you have difficulty maintaining continence: No Do you need assistance with getting out of bed/getting out of a chair/moving?: No Do you have difficulty managing or  taking your medications?: No  Follow up appointments reviewed: PCP Follow-up appointment confirmed?: NA Specialist Hospital Follow-up appointment confirmed?: No Reason Specialist Follow-Up Not Confirmed: Patient has Specialist Provider Number and will Call for Appointment (pt states since it has been a while since she has been seen, she has to schedule as a  New Patient) Do you need transportation to your follow-up appointment?: No Do you understand care options if your condition(s) worsen?: Yes-patient verbalized understanding    SIGNATURE: Devon Fogo, CMA

## 2024-03-28 ENCOUNTER — Other Ambulatory Visit (INDEPENDENT_AMBULATORY_CARE_PROVIDER_SITE_OTHER): Payer: Self-pay

## 2024-03-28 ENCOUNTER — Ambulatory Visit (INDEPENDENT_AMBULATORY_CARE_PROVIDER_SITE_OTHER): Admitting: Orthopedic Surgery

## 2024-03-28 ENCOUNTER — Encounter: Payer: Self-pay | Admitting: Hematology and Oncology

## 2024-03-28 ENCOUNTER — Encounter: Payer: Self-pay | Admitting: Orthopedic Surgery

## 2024-03-28 DIAGNOSIS — M65961 Unspecified synovitis and tenosynovitis, right lower leg: Secondary | ICD-10-CM

## 2024-03-28 DIAGNOSIS — G8929 Other chronic pain: Secondary | ICD-10-CM

## 2024-03-28 DIAGNOSIS — M25561 Pain in right knee: Secondary | ICD-10-CM

## 2024-03-28 NOTE — Progress Notes (Signed)
 Office Visit Note   Patient: Emma Mathews           Date of Birth: November 14, 1976           MRN: [id number] Visit Date: 03/28/2024 Requested by: Zorita Hiss, NP [id number] [place],  [place] [address] PCP: Zorita Hiss, NP  Subjective: Chief Complaint  Patient presents with   Right Knee - Pain    HPI: Emma Mathews is a 48 y.o. female who presents to the office reporting right knee pain which started 2 years ago.  Patient has had an 85 pound weight loss but her knees particularly the right knee has remained painful.  States that she was mowing the yard and stepped into a hole while she was mowing and an injection helped at that time.  That was several months ago.  She has been seeing a physical therapist at [facility] and they have been doing weight training and exercises 3 times a week for the past year.  She states that with activities of daily living along with some squat exercises she has more pain in the anterior part of her knee.  Part of her to walk around the neighborhood as well as downhill.  The pain is primarily anteromedial.  Does report some tightness in flexion.  Aleve  helps some.  States that she occasionally limps.  She is doing a lot of traveling over the next 4 to 6 weeks..                ROS: All systems reviewed are negative as they relate to the chief complaint within the history of present illness.  Patient denies fevers or chills.  Assessment & Plan: Visit Diagnoses:  1. Chronic pain of right knee     Plan: Impression is right knee pain with possible meniscal pathology versus occult arthritis.  She has been in physical therapy doing leg strengthening exercises with the therapist 3 times a week for the past year.  She has excellent quad strength as well as range of motion.  No real room for improvement or gains in that regard.  She has lost weight but her knee pain has been persistent.  She has tried taking medication over-the-counter which has helped some  but her symptoms remain.  MRI scan indicated to evaluate for medial meniscal tear.  Follow-up after that study.  Follow-Up Instructions: No follow-ups on file.   Orders:  Orders Placed This Encounter  Procedures   XR KNEE 3 VIEW RIGHT   MR Knee Right w/o contrast   No orders of the defined types were placed in this encounter.     Procedures: Large Joint Inj: R knee on 03/28/2024 12:13 PM Indications: diagnostic evaluation, joint swelling and pain Details: 18 G 1.5 in needle, superolateral approach  Arthrogram: No  Medications: 5 mL lidocaine  1 %; 4 mL bupivacaine  0.25 % Outcome: tolerated well, no immediate complications Procedure, treatment alternatives, risks and benefits explained, specific risks discussed. Consent was given by the patient. Immediately prior to procedure a time out was called to verify the correct patient, procedure, equipment, support staff and site/side marked as required. Patient was prepped and draped in the usual sterile fashion.    Kenalog  injected   Clinical Data: No additional findings.  Objective: Vital Signs: There were no vitals taken for this visit.  Physical Exam:  Constitutional: Patient appears well-developed HEENT:  Head: Normocephalic Eyes:EOM are normal Neck: Normal range of motion Cardiovascular: Normal rate Pulmonary/chest: Effort  normal Neurologic: Patient is alert Skin: Skin is warm Psychiatric: Patient has normal mood and affect  Ortho Exam: Ortho exam demonstrates full range of motion of both knees.  Has palpable pedal pulses with intact ankle dorsiflexion plantarflexion quad and hamstring strength.  Excellent quad strength bilateral knees.  Minimal patellofemoral crepitus.  Has medial greater than lateral joint line tenderness on that right knee.  Positive McMurray compression testing for medial compartment pathology.  No effusion in either knee.  Specialty Comments:  No specialty comments available.  Imaging: No results  found.   PMFS History: Patient Active Problem List   Diagnosis Date Noted   Pelvic mass in female 12/20/2023   Encounter for general adult medical examination with abnormal findings 07/08/2023   Need for vaccination 07/08/2023   Polycythemia, secondary 05/11/2023   Obesity (BMI 30-39.9) 01/13/2023   Acquired equinus deformity of left foot 01/08/2020   Low back pain 10/26/2019   Chronic migraine with aura 03/27/2018   Headache 02/01/2018   Motion sickness 02/01/2018   Degenerative TFCC tear, right 10/25/2017   ADD (attention deficit disorder) 10/28/2009   Allergic rhinitis 02/25/2009   Past Medical History:  Diagnosis Date   Anemia    Anxiety    Appendicitis, acute 10/02/2013   Cancer (HCC)    melanoma excision in 2013   Cholecystitis    Complication of anesthesia    slow to wake up   Depression    Encounter for lipid screening for cardiovascular disease 01/13/2023   Headache    migraine   History of kidney stones 03/2023   had lithotripsy   Mammographic breast lesion 06/28/2017   S/p biopsy, benign    Plantar fasciitis, left 06/05/2019   Pneumonia 2010   Polycythemia, secondary 05/11/2023   PONV (postoperative nausea and vomiting)    Seasonal allergies    STRESS FRACTURE, FOOT 04/08/2009   Qualifier: Diagnosis of  By: Larrie Po MD, Wilmon Hashimoto    Vision abnormalities    s/p Lasik    Family History  Adopted: Yes  Family history unknown: Yes    Past Surgical History:  Procedure Laterality Date   CHOLECYSTECTOMY     EXTRACORPOREAL SHOCK WAVE LITHOTRIPSY Right 04/18/2023   Procedure: RIGHT EXTRACORPOREAL SHOCK WAVE LITHOTRIPSY (ESWL);  Surgeon: Lahoma Pigg, MD;  Location: [facility];  Service: Urology;  Laterality: Right;   EYE SURGERY Bilateral    PRK   IUD REMOVAL N/A 12/20/2023   Procedure: INTRAUTERINE DEVICE (IUD) REMOVAL;  Surgeon: Derrel Flies, MD;  Location: [facility];  Service: Gynecology;  Laterality: N/A;   Labrum Repair Right 11/15/1998    Open   LAPAROSCOPIC APPENDECTOMY N/A 09/02/2013   Procedure: APPENDECTOMY LAPAROSCOPIC;  Surgeon: Rogena Class, MD;  Location: [facility];  Service: General;  Laterality: N/A;   ROBOTIC ASSISTED SALPINGO OOPHERECTOMY N/A 12/20/2023   Procedure: XI ROBOTIC ASSISTED LEFT SALPINGO OOPHORECTOMY RIGHT SALPINGECTOMY;  Surgeon: Derrel Flies, MD;  Location: [facility];  Service: Gynecology;  Laterality: N/A;   SHOULDER ARTHROSCOPY Left 11/16/2003   bone spurs    SHOULDER ARTHROSCOPY Right 11/01/2017   Procedure: RIGHT SHOULDER ARTHROSCOPY WITH SUBACROMIAL DECOMPRESSION;  Surgeon: Jasmine Mesi, MD;  Location: [facility];  Service: Orthopedics;  Laterality: Right;   SHOULDER ARTHROSCOPY WITH SUBACROMIAL DECOMPRESSION, ROTATOR CUFF REPAIR AND BICEP TENDON REPAIR Right 02/24/2016   Procedure: RIGHT SHOULDER ARTHROSCOPY WITH DEBRIDEMENT, BICEPS TENODESIS;  Surgeon: Jasmine Mesi, MD;  Location: [facility];  Service: Orthopedics;  Laterality: Right;   SHOULDER SURGERY  Right 11/15/2005   Revision and repair flap   WISDOM TOOTH EXTRACTION     WRIST ARTHROSCOPY Right 11/09/2017   Procedure: ARTHROSCOPY WRIST WITH DEBRIDEMENT;  Surgeon: Florida Hurter, MD;  Location: [facility];  Service: Orthopedics;  Laterality: Right;   Social History   Occupational History   Occupation: Print production planner  Tobacco Use   Smoking status: Never   Smokeless tobacco: Never  Vaping Use   Vaping status: Never Used  Substance and Sexual Activity   Alcohol use: Yes    Comment: ocassional - social 1- 2 drinks   Drug use: No   Sexual activity: Yes    Birth control/protection: I.U.D.

## 2024-04-01 MED ORDER — BUPIVACAINE HCL 0.25 % IJ SOLN
4.0000 mL | INTRAMUSCULAR | Status: AC | PRN
  Administered 2024-03-28: 4 mL via INTRA_ARTICULAR

## 2024-04-01 MED ORDER — LIDOCAINE HCL 1 % IJ SOLN
5.0000 mL | INTRAMUSCULAR | Status: AC | PRN
  Administered 2024-03-28: 5 mL

## 2024-04-02 ENCOUNTER — Other Ambulatory Visit

## 2024-04-06 ENCOUNTER — Ambulatory Visit
Admission: RE | Admit: 2024-04-06 | Discharge: 2024-04-06 | Disposition: A | Discharge status: Home / Self Care | Source: Ambulatory Visit | Attending: Orthopedic Surgery | Admitting: Orthopedic Surgery

## 2024-04-06 DIAGNOSIS — G8929 Other chronic pain: Secondary | ICD-10-CM

## 2024-04-06 DIAGNOSIS — M25461 Effusion, right knee: Secondary | ICD-10-CM | POA: Diagnosis not present

## 2024-04-06 DIAGNOSIS — M94261 Chondromalacia, right knee: Secondary | ICD-10-CM | POA: Diagnosis not present

## 2024-04-06 DIAGNOSIS — M1711 Unilateral primary osteoarthritis, right knee: Secondary | ICD-10-CM | POA: Diagnosis not present

## 2024-04-18 ENCOUNTER — Telehealth: Payer: Self-pay

## 2024-04-18 ENCOUNTER — Encounter: Payer: Self-pay | Admitting: Orthopedic Surgery

## 2024-04-18 ENCOUNTER — Ambulatory Visit (INDEPENDENT_AMBULATORY_CARE_PROVIDER_SITE_OTHER): Admitting: Orthopedic Surgery

## 2024-04-18 DIAGNOSIS — M65961 Unspecified synovitis and tenosynovitis, right lower leg: Secondary | ICD-10-CM

## 2024-04-18 NOTE — Progress Notes (Signed)
 Office Visit Note   Patient: Emma Mathews           Date of Birth: 1976-07-19           MRN: [id number] Visit Date: 04/18/2024 Requested by: Emma Hiss, NP [address] [place],  [place] [address] PCP: Emma Hiss, NP  Subjective: Chief Complaint  Patient presents with   Right Knee - Follow-up    HPI: Emma Mathews is a 48 y.o. female who presents to the office reporting right knee pain.  Said injection did not help as much is the first.  She did recently come back from [place] where she did not do much exercising.  She worked out Monday and is a little bit sore.  Pain is primarily anterior medial.  Takes Advil  and Aleve .  MRI of the right knee is reviewed.  On my review there is some near full-thickness cartilage loss in the anterior medial aspect of that knee.  Menisci intact both sides and ligaments also intact..                ROS: All systems reviewed are negative as they relate to the chief complaint within the history of present illness.  Patient denies fevers or chills.  Assessment & Plan: Visit Diagnoses:  1. Synovitis of right knee     Plan: Impression is right knee pain with symptoms localizing to the anterior medial aspect of the knee.  She does have some partial-thickness cartilage loss in that medial compartment.  I think gel injection would be the next step.  We also talked about adjusting her workouts so that she minimizes the weightbearing load across the knee joint.  She will come back in several months if she wants to try the gel injection in that right knee.  Follow-Up Instructions: No follow-ups on file.   Orders:  No orders of the defined types were placed in this encounter.  No orders of the defined types were placed in this encounter.     Procedures: No procedures performed   Clinical Data: No additional findings.  Objective: Vital Signs: There were no vitals taken for this visit.  Physical Exam:  Constitutional: Patient  appears well-developed HEENT:  Head: Normocephalic Eyes:EOM are normal Neck: Normal range of motion Cardiovascular: Normal rate Pulmonary/chest: Effort normal Neurologic: Patient is alert Skin: Skin is warm Psychiatric: Patient has normal mood and affect  Ortho Exam: Ortho exam demonstrates full active and passive range of motion of the right knee.  She does have patellofemoral crepitus bilaterally.  Collateral ligaments are stable.  No effusion today.  Does have some anteromedial joint line tenderness.  Pedal pulses palpable.  No Baker's cyst palpable. This patient is diagnosed with osteoarthritis of the knee(s).    Radiographs show evidence of joint space narrowing, osteophytes, subchondral sclerosis and/or subchondral cysts.  This patient has knee pain which interferes with functional and activities of daily living.    This patient has experienced inadequate response, adverse effects and/or intolerance with conservative treatments such as acetaminophen , NSAIDS, topical creams, physical therapy or regular exercise, knee bracing and/or weight loss.   This patient has experienced inadequate response or has a contraindication to intra articular steroid injections for at least 3 months.   This patient is not scheduled to have a total knee replacement within 6 months of starting treatment with viscosupplementation.   Specialty Comments:  No specialty comments available.  Imaging: No results found.   PMFS History: Patient Active Problem  List   Diagnosis Date Noted   Pelvic mass in female 12/20/2023   Encounter for general adult medical examination with abnormal findings 07/08/2023   Need for vaccination 07/08/2023   Polycythemia, secondary 05/11/2023   Obesity (BMI 30-39.9) 01/13/2023   Acquired equinus deformity of left foot 01/08/2020   Low back pain 10/26/2019   Chronic migraine with aura 03/27/2018   Headache 02/01/2018   Motion sickness 02/01/2018   Degenerative TFCC tear,  right 10/25/2017   ADD (attention deficit disorder) 10/28/2009   Allergic rhinitis 02/25/2009   Past Medical History:  Diagnosis Date   Anemia    Anxiety    Appendicitis, acute 10/02/2013   Cancer (HCC)    melanoma excision in 2013   Cholecystitis    Complication of anesthesia    slow to wake up   Depression    Encounter for lipid screening for cardiovascular disease 01/13/2023   Headache    migraine   History of kidney stones 03/2023   had lithotripsy   Mammographic breast lesion 06/28/2017   S/p biopsy, benign    Plantar fasciitis, left 06/05/2019   Pneumonia 2010   Polycythemia, secondary 05/11/2023   PONV (postoperative nausea and vomiting)    Seasonal allergies    STRESS FRACTURE, FOOT 04/08/2009   Qualifier: Diagnosis of  By: Larrie Po MD, Wilmon Hashimoto    Vision abnormalities    s/p Lasik    Family History  Adopted: Yes  Family history unknown: Yes    Past Surgical History:  Procedure Laterality Date   CHOLECYSTECTOMY     EXTRACORPOREAL SHOCK WAVE LITHOTRIPSY Right 04/18/2023   Procedure: RIGHT EXTRACORPOREAL SHOCK WAVE LITHOTRIPSY (ESWL);  Surgeon: Lahoma Pigg, MD;  Location: [facility];  Service: Urology;  Laterality: Right;   EYE SURGERY Bilateral    PRK   IUD REMOVAL N/A 12/20/2023   Procedure: INTRAUTERINE DEVICE (IUD) REMOVAL;  Surgeon: Derrel Flies, MD;  Location: [facility];  Service: Gynecology;  Laterality: N/A;   Labrum Repair Right 11/15/1998   Open   LAPAROSCOPIC APPENDECTOMY N/A 09/02/2013   Procedure: APPENDECTOMY LAPAROSCOPIC;  Surgeon: Rogena Class, MD;  Location: [facility];  Service: General;  Laterality: N/A;   ROBOTIC ASSISTED SALPINGO OOPHERECTOMY N/A 12/20/2023   Procedure: XI ROBOTIC ASSISTED LEFT SALPINGO OOPHORECTOMY RIGHT SALPINGECTOMY;  Surgeon: Derrel Flies, MD;  Location: [facility];  Service: Gynecology;  Laterality: N/A;   SHOULDER ARTHROSCOPY Left 11/16/2003   bone spurs    SHOULDER ARTHROSCOPY Right 11/01/2017    Procedure: RIGHT SHOULDER ARTHROSCOPY WITH SUBACROMIAL DECOMPRESSION;  Surgeon: Jasmine Mesi, MD;  Location: [facility];  Service: Orthopedics;  Laterality: Right;   SHOULDER ARTHROSCOPY WITH SUBACROMIAL DECOMPRESSION, ROTATOR CUFF REPAIR AND BICEP TENDON REPAIR Right 02/24/2016   Procedure: RIGHT SHOULDER ARTHROSCOPY WITH DEBRIDEMENT, BICEPS TENODESIS;  Surgeon: Jasmine Mesi, MD;  Location: [facility];  Service: Orthopedics;  Laterality: Right;   SHOULDER SURGERY Right 11/15/2005   Revision and repair flap   WISDOM TOOTH EXTRACTION     WRIST ARTHROSCOPY Right 11/09/2017   Procedure: ARTHROSCOPY WRIST WITH DEBRIDEMENT;  Surgeon: Florida Hurter, MD;  Location: [facility];  Service: Orthopedics;  Laterality: Right;   Social History   Occupational History   Occupation: Print production planner  Tobacco Use   Smoking status: Never   Smokeless tobacco: Never  Vaping Use   Vaping status: Never Used  Substance and Sexual Activity   Alcohol use: Yes    Comment: ocassional - social 1- 2 drinks  Drug use: No   Sexual activity: Yes    Birth control/protection: I.U.D.

## 2024-04-18 NOTE — Telephone Encounter (Signed)
 Right knee gel injection

## 2024-04-19 NOTE — Telephone Encounter (Signed)
 VOB submitted for Monovisc, right knee

## 2024-04-25 ENCOUNTER — Other Ambulatory Visit: Payer: Self-pay

## 2024-04-27 ENCOUNTER — Other Ambulatory Visit (HOSPITAL_COMMUNITY): Payer: Self-pay

## 2024-05-16 ENCOUNTER — Other Ambulatory Visit: Payer: Self-pay

## 2024-05-16 ENCOUNTER — Other Ambulatory Visit (HOSPITAL_COMMUNITY): Payer: Self-pay

## 2024-05-19 ENCOUNTER — Telehealth: Admitting: Emergency Medicine

## 2024-05-19 DIAGNOSIS — R3989 Other symptoms and signs involving the genitourinary system: Secondary | ICD-10-CM | POA: Diagnosis not present

## 2024-05-20 MED ORDER — NITROFURANTOIN MONOHYD MACRO 100 MG PO CAPS: 100.0000 mg | ORAL_CAPSULE | Freq: Two times a day (BID) | ORAL | 0 refills | Status: DC

## 2024-05-20 NOTE — Progress Notes (Signed)
E-Visit for Urinary Problems ? ?We are sorry that you are not feeling well.  Here is how we plan to help! ? ?Based on what you shared with me it looks like you most likely have a simple urinary tract infection. ? ?A UTI (Urinary Tract Infection) is a bacterial infection of the bladder. ? ?Most cases of urinary tract infections are simple to treat but a key part of your care is to encourage you to drink plenty of fluids and watch your symptoms carefully. ? ?I have prescribed MacroBid 100 mg twice a day for 5 days.  Your symptoms should gradually improve. Call us if the burning in your urine worsens, you develop worsening fever, back pain or pelvic pain or if your symptoms do not resolve after completing the antibiotic. ? ?Urinary tract infections can be prevented by drinking plenty of water to keep your body hydrated.  Also be sure when you wipe, wipe from front to back and don't hold it in!  If possible, empty your bladder every 4 hours. ? ?HOME CARE ?Drink plenty of fluids ?Compete the full course of the antibiotics even if the symptoms resolve ?Remember, when you need to go?go. Holding in your urine can increase the likelihood of getting a UTI! ?GET HELP RIGHT AWAY IF: ?You cannot urinate ?You get a high fever ?Worsening back pain occurs ?You see blood in your urine ?You feel sick to your stomach or throw up ?You feel like you are going to pass out ? ?MAKE SURE YOU  ?Understand these instructions. ?Will watch your condition. ?Will get help right away if you are not doing well or get worse. ? ? ?Thank you for choosing an e-visit. ? ?Your e-visit answers were reviewed by a board certified advanced clinical practitioner to complete your personal care plan. Depending upon the condition, your plan could have included both over the counter or prescription medications. ? ?Please review your pharmacy choice. Make sure the pharmacy is open so you can pick up prescription now. If there is a problem, you may contact your  provider through MyChart messaging and have the prescription routed to another pharmacy.  Your safety is important to us. If you have drug allergies check your prescription carefully.  ? ?For the next 24 hours you can use MyChart to ask questions about today's visit, request a non-urgent call back, or ask for a work or school excuse. ?You will get an email in the next two days asking about your experience. I hope that your e-visit has been valuable and will speed your recovery. ? ?I have spent 5 minutes in review of e-visit questionnaire, review and updating patient chart, medical decision making and response to patient.  ? ?Angela Kabbe, PhD, FNP-BC ?  ?

## 2024-05-31 ENCOUNTER — Other Ambulatory Visit (HOSPITAL_COMMUNITY): Payer: Self-pay

## 2024-06-01 ENCOUNTER — Other Ambulatory Visit (HOSPITAL_BASED_OUTPATIENT_CLINIC_OR_DEPARTMENT_OTHER): Payer: Self-pay

## 2024-06-01 ENCOUNTER — Other Ambulatory Visit (HOSPITAL_COMMUNITY): Payer: Self-pay

## 2024-06-01 ENCOUNTER — Telehealth: Admitting: Physician Assistant

## 2024-06-01 DIAGNOSIS — T3695XA Adverse effect of unspecified systemic antibiotic, initial encounter: Secondary | ICD-10-CM | POA: Diagnosis not present

## 2024-06-01 DIAGNOSIS — H00014 Hordeolum externum left upper eyelid: Secondary | ICD-10-CM | POA: Diagnosis not present

## 2024-06-01 DIAGNOSIS — B379 Candidiasis, unspecified: Secondary | ICD-10-CM | POA: Diagnosis not present

## 2024-06-01 MED ORDER — FLUCONAZOLE 150 MG PO TABS
150.0000 mg | ORAL_TABLET | ORAL | 0 refills | Status: DC | PRN
  Filled 2024-06-01: qty 2, 6d supply, fill #0

## 2024-06-01 MED ORDER — AMOXICILLIN-POT CLAVULANATE 875-125 MG PO TABS
1.0000 | ORAL_TABLET | Freq: Two times a day (BID) | ORAL | 0 refills | Status: DC
  Filled 2024-06-01: qty 14, 7d supply, fill #0

## 2024-06-01 NOTE — Progress Notes (Signed)
  E-Visit for Stye   We are sorry that you are not feeling well. Here is how we plan to help!  Based on what you have shared with me it looks like you have a stye.  A stye is an inflammation of the eyelid.  It is often a red, painful lump near the edge of the eyelid that may look like a boil or a pimple.  A stye develops when an infection occurs at the base of an eyelash.   We have made appropriate suggestions for you based upon your presentation: Simple styes can be treated without medical intervention.  Most styes either resolve spontaneously or resolve with simple home treatment by applying warm compresses or heated washcloth to the stye for about 10-15 minutes three to four times a day. This causes the stye to drain and resolve.  I have prescribed Augmentin  875-125mg  Take 1 tablet twice daily for 7 days  HOME CARE:  Wash your hands often! Let the stye open on its own. Don't squeeze or open it. Don't rub your eyes. This can irritate your eyes and let in bacteria.  If you need to touch your eyes, wash your hands first. Don't wear eye makeup or contact lenses until the area has healed.  GET HELP RIGHT AWAY IF:  Your symptoms do not improve. You develop blurred or loss of vision. Your symptoms worsen (increased discharge, pain or redness).   Thank you for choosing an e-visit.  Your e-visit answers were reviewed by a board certified advanced clinical practitioner to complete your personal care plan. Depending upon the condition, your plan could have included both over the counter or prescription medications.  Please review your pharmacy choice. Make sure the pharmacy is open so you can pick up prescription now. If there is a problem, you may contact your provider through [employer] and have the prescription routed to another pharmacy.  Your safety is important to us . If you have drug allergies check your prescription carefully.   For the next 24 hours you can use MyChart to ask  questions about today's visit, request a non-urgent call back, or ask for a work or school excuse. You will get an email in the next two days asking about your experience. I hope that your e-visit has been valuable and will speed your recovery.     I have spent 5 minutes in review of e-visit questionnaire, review and updating patient chart, medical decision making and response to patient.   Delon CHRISTELLA Dickinson, PA-C

## 2024-06-01 NOTE — Addendum Note (Signed)
 Addended by: VIVIENNE DELON HERO on: 06/01/2024 12:33 PM   Modules accepted: Orders

## 2024-06-15 ENCOUNTER — Other Ambulatory Visit: Payer: Self-pay

## 2024-06-15 DIAGNOSIS — D751 Secondary polycythemia: Secondary | ICD-10-CM

## 2024-06-18 ENCOUNTER — Inpatient Hospital Stay: Attending: Hematology and Oncology

## 2024-06-18 ENCOUNTER — Other Ambulatory Visit (HOSPITAL_COMMUNITY): Payer: Self-pay

## 2024-06-18 ENCOUNTER — Inpatient Hospital Stay

## 2024-06-18 ENCOUNTER — Inpatient Hospital Stay (HOSPITAL_BASED_OUTPATIENT_CLINIC_OR_DEPARTMENT_OTHER): Admitting: Hematology and Oncology

## 2024-06-18 VITALS — BP 123/68 | HR 79 | Temp 98.8°F | Resp 16

## 2024-06-18 VITALS — BP 115/46 | HR 71 | Temp 97.7°F | Resp 16 | Ht 66.0 in | Wt 151.9 lb

## 2024-06-18 DIAGNOSIS — Z885 Allergy status to narcotic agent status: Secondary | ICD-10-CM | POA: Diagnosis not present

## 2024-06-18 DIAGNOSIS — Z79890 Hormone replacement therapy (postmenopausal): Secondary | ICD-10-CM | POA: Insufficient documentation

## 2024-06-18 DIAGNOSIS — L299 Pruritus, unspecified: Secondary | ICD-10-CM | POA: Diagnosis not present

## 2024-06-18 DIAGNOSIS — D751 Secondary polycythemia: Secondary | ICD-10-CM | POA: Diagnosis not present

## 2024-06-18 DIAGNOSIS — Z79899 Other long term (current) drug therapy: Secondary | ICD-10-CM | POA: Insufficient documentation

## 2024-06-18 DIAGNOSIS — Z91041 Radiographic dye allergy status: Secondary | ICD-10-CM | POA: Diagnosis not present

## 2024-06-18 LAB — CBC WITH DIFFERENTIAL (CANCER CENTER ONLY)
Abs Immature Granulocytes: 0.01 K/uL (ref 0.00–0.07)
Basophils Absolute: 0.1 K/uL (ref 0.0–0.1)
Basophils Relative: 1 %
Eosinophils Absolute: 0.7 K/uL — ABNORMAL HIGH (ref 0.0–0.5)
Eosinophils Relative: 11 %
HCT: 42.7 % (ref 36.0–46.0)
Hemoglobin: 14.3 g/dL (ref 12.0–15.0)
Immature Granulocytes: 0 %
Lymphocytes Relative: 35 %
Lymphs Abs: 2 K/uL (ref 0.7–4.0)
MCH: 27.8 pg (ref 26.0–34.0)
MCHC: 33.5 g/dL (ref 30.0–36.0)
MCV: 82.9 fL (ref 80.0–100.0)
Monocytes Absolute: 0.5 K/uL (ref 0.1–1.0)
Monocytes Relative: 9 %
Neutro Abs: 2.6 K/uL (ref 1.7–7.7)
Neutrophils Relative %: 44 %
Platelet Count: 260 K/uL (ref 150–400)
RBC: 5.15 MIL/uL — ABNORMAL HIGH (ref 3.87–5.11)
RDW: 13.3 % (ref 11.5–15.5)
WBC Count: 5.8 K/uL (ref 4.0–10.5)
nRBC: 0 % (ref 0.0–0.2)

## 2024-06-18 MED ORDER — HYDROXYZINE HCL 10 MG PO TABS
10.0000 mg | ORAL_TABLET | Freq: Three times a day (TID) | ORAL | 3 refills | Status: AC | PRN
  Filled 2024-06-18: qty 60, 20d supply, fill #0
  Filled 2024-11-13: qty 60, 20d supply, fill #1

## 2024-06-18 NOTE — Assessment & Plan Note (Signed)
 Profound itching of the extremities with exposure to heat (especially hot water  or hot weather)   Lab review: 01/13/2023: Hemoglobin 15.7, hematocrit 46.2 05/03/2023: Hemoglobin 15.5, hematocrit 46.6 05/17/2023: MPN panel: Normal, erythropoietin  6.4, hemoglobin 15.6 02/13/2024: Hemoglobin 15, hematocrit 44 03/17/2024: Hemoglobin 13.4 06/18/2024: Hemoglobin   Phlebotomy: Started 05/17/2023 (only done 3 times) 12/20/2023: Robotic left salpingo-oophorectomy, right salpingectomy IUD removal: Mucinous cystadenoma with inflammation   MPN panel as well as erythropoietin  are both normal. Itching resolved with phlebotomy. The other potential cause of itching could be the hot weather. I recommended proceeding with phlebotomy at this time.  If in spite of the phlebotomy she gets itching then we can attribute most of it to hot weather and we do not have to do as often phlebotomy treatments.   Recheck labs in 4 months and decide if she needs a phlebotomy then.

## 2024-06-18 NOTE — Patient Instructions (Signed)

## 2024-06-18 NOTE — Progress Notes (Signed)
 Emma Mathews presents today for phlebotomy per MD orders. Phlebotomy procedure started at 1727 and ended at 1800. 20G PIV used to L FA. 458 grams removed. Patient declined post observation.  Patient tolerated procedure well. IV needle removed intact.

## 2024-06-18 NOTE — Progress Notes (Signed)
 Patient Care Team: Elnor Lauraine BRAVO, NP as PCP - General (Nurse Practitioner) Ob/Gyn, [facility] as Consulting Physician (Obstetrics and Gynecology)  DIAGNOSIS:  Encounter Diagnosis  Name Primary?   Polycythemia, secondary Yes   CHIEF COMPLIANT: Follow-up of secondary polycythemia causing profound itching  HISTORY OF PRESENT ILLNESS:  History of Present Illness Emma Mathews is a 48 year old female with polycythemia who presents with persistent itching.  She experiences daily itching for the past two weeks, which worsened after a shower. Similar episodes have occurred during previous travels, but no consistent triggers are identified. There is no fever.  She has polycythemia and underwent three rounds of phlebotomy last year, which were effective for a year. Current lab results show hemoglobin of 14, hematocrit of 42, and mildly elevated absolute somatophils. Her symptoms resemble those when her red blood cell count is elevated.  She took hydroxyzine  10 mg three nights ago, which she finds effective but cannot use while working. She has not refilled her prescription.     ALLERGIES:  is allergic to diphenhydramine  hcl, diphenhydramine  hcl, iodinated contrast media, chlorhexidine  gluconate, and codeine.  MEDICATIONS:  Current Outpatient Medications  Medication Sig Dispense Refill   estradiol  (VIVELLE -DOT) 0.025 MG/24HR Place 1 patch onto the skin 2 (two) times a week. 8 patch 3   FIBER GUMMIES PO Take 3 each by mouth daily.     levocetirizine (XYZAL ALLERGY 24HR) 5 MG tablet Take 5 mg by mouth every evening.     mometasone  (NASONEX ) 50 MCG/ACT nasal spray Place 2 sprays into the nose daily.     Multiple Vitamins-Minerals (MULTIPLE VITAMINS/WOMENS PO) Take 1 tablet by mouth daily.     pantoprazole  (PROTONIX ) 40 MG tablet Take 1 tablet (40 mg total) by mouth daily. Take 30 minutes before breakfast. 90 tablet 1   progesterone  (PROMETRIUM ) 200 MG capsule Take 1 capsule (200  mg total) by mouth daily. 30 capsule 4   rizatriptan  (MAXALT -MLT) 10 MG disintegrating tablet DISSOLVE 1 TABLET (10 MG TOTAL) BY MOUTH AS NEEDED. MAY REPEAT IN 2 HOURS IF NEEDED 15 tablet 0   SUMAtriptan  6 MG/0.5ML SOAJ Inject 6mg  at onset of migraine.  May repeat in 2 hrs, if needed.  Max dose: 2 injections/day. This is a 30 day prescription. 10 Cartridge 5   tirzepatide  (ZEPBOUND ) 5 MG/0.5ML injection vial Inject 5 mg into the skin once a week. 2 mL 3   tretinoin  (RETIN-A ) 0.025 % cream Apply a small amount at night time to face and neck , twice a week. Increase according to tolerance up to daily 45 g 2   Vibegron  (GEMTESA ) 75 MG TABS Take 1 tablet (75 mg total) by mouth daily. 30 tablet 11   No current facility-administered medications for this visit.    PHYSICAL EXAMINATION: ECOG PERFORMANCE STATUS: 1 - Symptomatic but completely ambulatory  Vitals:   06/18/24 1530  BP: (!) 115/46  Pulse: 71  Resp: 16  Temp: 97.7 F (36.5 C)  SpO2: 100%   Filed Weights   06/18/24 1530  Weight: 151 lb 14.4 oz (68.9 kg)      LABORATORY DATA:  I have reviewed the data as listed    Latest Ref Rng & Units 03/17/2024    2:30 AM 12/14/2023    8:30 AM 07/08/2023    8:52 AM  CMP  Glucose 70 - 99 mg/dL 95  80  85   BUN 6 - 20 mg/dL 17  17  14    Creatinine 0.44 -  1.00 mg/dL 9.24  9.29  9.18   Sodium 135 - 145 mmol/L 138  137  140   Potassium 3.5 - 5.1 mmol/L 3.9  4.3  4.0   Chloride 98 - 111 mmol/L 101  106  106   CO2 22 - 32 mmol/L 23  23  26    Calcium 8.9 - 10.3 mg/dL 9.9  9.6  9.6   Total Protein 6.5 - 8.1 g/dL 7.6  7.8  7.8   Total Bilirubin 0.0 - 1.2 mg/dL 0.4  0.9  0.8   Alkaline Phos 38 - 126 U/L 73  58  70   AST 15 - 41 U/L 21  15  14    ALT 0 - 44 U/L 11  11  10      Lab Results  Component Value Date   WBC 5.8 06/18/2024   HGB 14.3 06/18/2024   HCT 42.7 06/18/2024   MCV 82.9 06/18/2024   PLT 260 06/18/2024   NEUTROABS 2.6 06/18/2024    ASSESSMENT & PLAN:  Polycythemia,  secondary Profound itching of the extremities with exposure to heat (especially hot water  or hot weather)   Lab review: 01/13/2023: Hemoglobin 15.7, hematocrit 46.2 05/03/2023: Hemoglobin 15.5, hematocrit 46.6 05/17/2023: MPN panel: Normal, erythropoietin  6.4, hemoglobin 15.6 02/13/2024: Hemoglobin 15, hematocrit 44 03/17/2024: Hemoglobin 13.4 06/18/2024: Hemoglobin 14.3 (itching started 06/01/2024)   Phlebotomy: Started 05/17/2023 (only done 2 times every 8 weeks), 06/18/2024 12/20/2023: Robotic left salpingo-oophorectomy, right salpingectomy IUD removal: Mucinous cystadenoma with inflammation   MPN panel as well as erythropoietin  are both normal. Itching resolved with phlebotomy. The other potential cause of itching could be the hot weather. I recommended proceeding with phlebotomy at this time.      Because she does not get the symptoms relapse as often, we will see her on an as-needed basis.  If she complains of itching we will set her up for another phlebotomy in the future. I renewed her prescription for hydroxyzine .  Return to clinic on an as-needed basis.  No orders of the defined types were placed in this encounter.  The patient has a good understanding of the overall plan. she agrees with it. she will call with any problems that may develop before the next visit here. Total time spent: 30 mins including face to face time and time spent for planning, charting and co-ordination of care   [name], MD 06/18/24

## 2024-06-20 ENCOUNTER — Other Ambulatory Visit (HOSPITAL_COMMUNITY): Payer: Self-pay

## 2024-06-21 ENCOUNTER — Other Ambulatory Visit (HOSPITAL_COMMUNITY): Payer: Self-pay

## 2024-06-22 ENCOUNTER — Other Ambulatory Visit: Payer: Self-pay

## 2024-06-22 ENCOUNTER — Other Ambulatory Visit (HOSPITAL_COMMUNITY): Payer: Self-pay

## 2024-06-22 MED ORDER — ESTRADIOL 0.025 MG/24HR TD PTTW
MEDICATED_PATCH | TRANSDERMAL | 3 refills | Status: DC
  Filled 2024-06-22: qty 8, 28d supply, fill #0
  Filled 2024-07-20 – 2024-07-30 (×2): qty 8, 28d supply, fill #1
  Filled 2024-08-23: qty 8, 28d supply, fill #2
  Filled 2024-09-25: qty 8, 28d supply, fill #3

## 2024-06-22 MED ORDER — VIBEGRON 75 MG PO TABS
75.0000 mg | ORAL_TABLET | Freq: Every day | ORAL | 11 refills | Status: AC
  Filled 2024-06-22: qty 30, 30d supply, fill #0
  Filled 2024-07-20 – 2024-07-30 (×2): qty 30, 30d supply, fill #1
  Filled 2024-09-07: qty 30, 30d supply, fill #2
  Filled 2024-10-17: qty 30, 30d supply, fill #3
  Filled 2024-11-13: qty 30, 30d supply, fill #4

## 2024-06-23 ENCOUNTER — Telehealth: Admitting: Nurse Practitioner

## 2024-06-23 DIAGNOSIS — H00014 Hordeolum externum left upper eyelid: Secondary | ICD-10-CM | POA: Diagnosis not present

## 2024-06-23 MED ORDER — NEOMYCIN-POLYMYXIN-DEXAMETH 3.5-10000-0.1 OP SUSP: 2.0000 [drp] | Freq: Four times a day (QID) | OPHTHALMIC | 0 refills | Status: DC

## 2024-06-23 NOTE — Progress Notes (Signed)
 I have spent 5 minutes in review of e-visit questionnaire, review and updating patient chart, medical decision making and response to patient.   Claiborne Rigg, NP

## 2024-06-23 NOTE — Progress Notes (Signed)
  E-Visit for Stye   We are sorry that you are not feeling well. Here is how we plan to help!  I have sent a steroid eye drop with an antibiotic component. If the stye does not improve you will need to see an eye doctor to see if it needs to be excised.   Based on what you have shared with me it looks like you have a stye.  A stye is an inflammation of the eyelid.  It is often a red, painful lump near the edge of the eyelid that may look like a boil or a pimple.  A stye develops when an infection occurs at the base of an eyelash.   We have made appropriate suggestions for you based upon your presentation: I have prescribed Maxitrol 3.03-999-1 Use 1-2 drops in affected eye every 6 hours while awake for 5 days. To Apply: Tilt your head back, look upward, and pull down the lower eyelid to make a pouch. Hold the dropper directly over your eye and place one drop into the pouch. Look downward and gently close your eyes for 1 to 2 minutes. Place one finger at the corner of your eye (near the nose) and apply gentle pressure. This will prevent the medication from draining out. Try not to blink and do not rub your eye. Repeat these steps if your dose is for more than 1 drop and for your other eye if so directed. The dosage is based on your medical condition and response to treatment.  HOME CARE:  Wash your hands often! Let the stye open on its own. Don't squeeze or open it. Don't rub your eyes. This can irritate your eyes and let in bacteria.  If you need to touch your eyes, wash your hands first. Don't wear eye makeup or contact lenses until the area has healed.  GET HELP RIGHT AWAY IF:  Your symptoms do not improve. You develop blurred or loss of vision. Your symptoms worsen (increased discharge, pain or redness).   Thank you for choosing an e-visit.  Your e-visit answers were reviewed by a board certified advanced clinical practitioner to complete your personal care plan. Depending upon the  condition, your plan could have included both over the counter or prescription medications.  Please review your pharmacy choice. Make sure the pharmacy is open so you can pick up prescription now. If there is a problem, you may contact your provider through [employer] and have the prescription routed to another pharmacy.  Your safety is important to us . If you have drug allergies check your prescription carefully.   For the next 24 hours you can use MyChart to ask questions about today's visit, request a non-urgent call back, or ask for a work or school excuse. You will get an email in the next two days asking about your experience. I hope that your e-visit has been valuable and will speed your recovery.

## 2024-06-27 ENCOUNTER — Other Ambulatory Visit (HOSPITAL_COMMUNITY): Payer: Self-pay

## 2024-06-27 DIAGNOSIS — H0014 Chalazion left upper eyelid: Secondary | ICD-10-CM | POA: Diagnosis not present

## 2024-06-27 MED ORDER — OFLOXACIN 0.3 % OP SOLN
1.0000 [drp] | Freq: Three times a day (TID) | OPHTHALMIC | 0 refills | Status: DC
  Filled 2024-06-27: qty 5, 34d supply, fill #0

## 2024-07-20 ENCOUNTER — Other Ambulatory Visit: Payer: Self-pay

## 2024-07-20 ENCOUNTER — Other Ambulatory Visit (HOSPITAL_COMMUNITY): Payer: Self-pay

## 2024-07-20 ENCOUNTER — Other Ambulatory Visit: Payer: Self-pay | Admitting: Nurse Practitioner

## 2024-07-20 MED ORDER — PANTOPRAZOLE SODIUM 40 MG PO TBEC
40.0000 mg | DELAYED_RELEASE_TABLET | Freq: Every day | ORAL | 1 refills | Status: AC
  Filled 2024-07-20 – 2024-07-30 (×2): qty 90, 90d supply, fill #0
  Filled 2024-10-01 – 2024-11-13 (×2): qty 90, 90d supply, fill #1

## 2024-07-30 ENCOUNTER — Other Ambulatory Visit (HOSPITAL_COMMUNITY): Payer: Self-pay

## 2024-08-01 ENCOUNTER — Telehealth: Payer: Self-pay

## 2024-08-01 DIAGNOSIS — M65961 Unspecified synovitis and tenosynovitis, right lower leg: Secondary | ICD-10-CM

## 2024-08-01 DIAGNOSIS — H0014 Chalazion left upper eyelid: Secondary | ICD-10-CM | POA: Diagnosis not present

## 2024-08-01 NOTE — Telephone Encounter (Signed)
 PA pending for Monovisc.  Should be approved by appt.date.

## 2024-08-09 ENCOUNTER — Ambulatory Visit (HOSPITAL_COMMUNITY)
Admission: RE | Admit: 2024-08-09 | Discharge: 2024-08-09 | Disposition: A | Discharge status: Home / Self Care | Source: Ambulatory Visit | Attending: Family Medicine | Admitting: Family Medicine

## 2024-08-09 ENCOUNTER — Encounter (HOSPITAL_COMMUNITY): Payer: Self-pay

## 2024-08-09 DIAGNOSIS — R051 Acute cough: Secondary | ICD-10-CM | POA: Diagnosis not present

## 2024-08-09 DIAGNOSIS — B349 Viral infection, unspecified: Secondary | ICD-10-CM

## 2024-08-09 LAB — POC COVID19/FLU A&B COMBO
Covid Antigen, POC: NEGATIVE
Influenza A Antigen, POC: NEGATIVE
Influenza B Antigen, POC: NEGATIVE

## 2024-08-09 MED ORDER — PREDNISONE 20 MG PO TABS: 40.0000 mg | ORAL_TABLET | Freq: Every day | ORAL | 0 refills | Status: DC

## 2024-08-09 MED ORDER — HYDROCODONE BIT-HOMATROP MBR 5-1.5 MG/5ML PO SOLN
5.0000 mL | Freq: Four times a day (QID) | ORAL | 0 refills | Status: DC | PRN
Start: 2024-08-09 — End: 2024-09-25

## 2024-08-09 NOTE — ED Triage Notes (Signed)
 Patient reports that she has had chills, a non productive cough, scratchy throat, fever, body aches, and a headache since yesterday.  Patient states she took Tylenol  at 1600 today.

## 2024-08-14 NOTE — ED Provider Notes (Signed)
 [facility]   [id number] 08/09/24 Arrival Time: 1903  ASSESSMENT & PLAN:  1. Acute cough   2. Viral illness    Discussed typical duration of likely viral illness. Results for orders placed or performed during the hospital encounter of 08/09/24  POC Covid19/Flu A&B Antigen   Collection Time: 08/09/24  8:05 PM  Result Value Ref Range   Influenza A Antigen, POC Negative Negative   Influenza B Antigen, POC Negative Negative   Covid Antigen, POC Negative Negative   OTC symptom care as needed.  Meds ordered this encounter  Medications   HYDROcodone  bit-homatropine (HYCODAN) 5-1.5 MG/5ML syrup    Sig: Take 5 mLs by mouth every 6 (six) hours as needed for cough.    Dispense:  90 mL    Refill:  0   predniSONE  (DELTASONE ) 20 MG tablet    Sig: Take 2 tablets (40 mg total) by mouth daily.    Dispense:  10 tablet    Refill:  0     Follow-up Information     Elnor Lauraine BRAVO, NP.   Specialty: Nurse Practitioner Why: As needed. Contact information: [address] [place] [address] [phone]                 Reviewed expectations re: course of current medical issues. Questions answered. Outlined signs and symptoms indicating need for more acute intervention. Understanding verbalized. After Visit Summary given.   SUBJECTIVE: History from: Patient. Emma Mathews is a 48 y.o. female. Patient reports that she has had chills, a non productive cough, scratchy throat, fever, body aches, and a headache since yesterday.  Patient states she took Tylenol  at 1600 today. Denies: difficulty breathing. Normal PO intake without n/v/d.  OBJECTIVE:  There were no vitals filed for this visit.  General appearance: alert; no distress Eyes: PERRLA; EOMI; conjunctiva normal HENT: [place]; AT; with nasal congestion Neck: supple  Lungs: speaks full sentences without difficulty; unlabored; with cough Extremities: no edema Skin: warm and dry Neurologic: normal  gait Psychological: alert and cooperative; normal mood and affect  Labs: Results for orders placed or performed during the hospital encounter of 08/09/24  POC Covid19/Flu A&B Antigen   Collection Time: 08/09/24  8:05 PM  Result Value Ref Range   Influenza A Antigen, POC Negative Negative   Influenza B Antigen, POC Negative Negative   Covid Antigen, POC Negative Negative   Labs Reviewed  POC COVID19/FLU A&B COMBO - Normal    Imaging: No results found.  Allergies  Allergen Reactions   Diphenhydramine  Hcl Hives and Other (See Comments)    Pt states she can take Dye free Benadryl    Diphenhydramine  Hcl Other (See Comments) and Hives    Pt states she can take Dye free Benadryl    Iodinated Contrast Media Shortness Of Breath, Nausea And Vomiting, Other (See Comments) and Anaphylaxis    Shortness of breath   Chlorhexidine  Gluconate Hives   Codeine Hives, Nausea And Vomiting and Rash    Past Medical History:  Diagnosis Date   Anemia    Anxiety    Appendicitis, acute 10/02/2013   Cancer (HCC)    melanoma excision in 2013   Cholecystitis    Complication of anesthesia    slow to wake up   Depression    Encounter for lipid screening for cardiovascular disease 01/13/2023   Headache    migraine   History of kidney stones 03/2023   had lithotripsy   Mammographic breast lesion 06/28/2017   S/p  biopsy, benign    Plantar fasciitis, left 06/05/2019   Pneumonia 2010   Polycythemia, secondary 05/11/2023   PONV (postoperative nausea and vomiting)    Seasonal allergies    STRESS FRACTURE, FOOT 04/08/2009   Qualifier: Diagnosis of  By: Mavis MD, Norleen BRAVO    Vision abnormalities    s/p Lasik   Social History   Socioeconomic History   Marital status: Married    Spouse name: Beverley   Number of children: 1   Years of education: Not on file   Highest education level: Bachelor's degree (e.g., BA, AB, BS)  Occupational History   Occupation: Print production planner  Tobacco Use   Smoking  status: Never   Smokeless tobacco: Never  Vaping Use   Vaping status: Never Used  Substance and Sexual Activity   Alcohol use: Yes    Comment: ocassional - social 1- 2 drinks   Drug use: No   Sexual activity: Yes    Birth control/protection: I.U.D.  Other Topics Concern   Not on file  Social History Narrative   Patient lives with husband and one child, a son named Max.    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.    Social Drivers of Corporate investment banker Strain: Low Risk  (01/27/2024)   Overall Financial Resource Strain (CARDIA)    Difficulty of Paying Living Expenses: Not hard at all  Food Insecurity: No Food Insecurity (01/27/2024)   Hunger Vital Sign    Worried About Running Out of Food in the Last Year: Never true    Ran Out of Food in the Last Year: Never true  Transportation Needs: No Transportation Needs (01/27/2024)   PRAPARE - Administrator, Civil Service (Medical): No    Lack of Transportation (Non-Medical): No  Physical Activity: Sufficiently Active (01/27/2024)   Exercise Vital Sign    Days of Exercise per Week: 3 days    Minutes of Exercise per Session: 60 min  Stress: No Stress Concern Present (01/27/2024)   [employer] of Occupational Health - Occupational Stress Questionnaire    Feeling of Stress : Only a little  Social Connections: Socially Integrated (01/27/2024)   Social Connection and Isolation Panel    Frequency of Communication with Friends and Family: More than three times a week    Frequency of Social Gatherings with Friends and Family: Three times a week    Attends Religious Services: More than 4 times per year    Active Member of Clubs or Organizations: Yes    Attends Banker Meetings: More than 4 times per year    Marital Status: Married  Catering manager Violence: Not At Risk (12/09/2023)   Humiliation, Afraid, Rape, and Kick questionnaire    Fear of Current or Ex-Partner: No     Emotionally Abused: No    Physically Abused: No    Sexually Abused: No   Family History  Adopted: Yes  Family history unknown: Yes   Past Surgical History:  Procedure Laterality Date   CHOLECYSTECTOMY     EXTRACORPOREAL SHOCK WAVE LITHOTRIPSY Right 04/18/2023   Procedure: RIGHT EXTRACORPOREAL SHOCK WAVE LITHOTRIPSY (ESWL);  Surgeon: Selma Donnice SAUNDERS, MD;  Location: [facility];  Service: Urology;  Laterality: Right;   EYE SURGERY Bilateral    PRK   IUD REMOVAL N/A 12/20/2023   Procedure: INTRAUTERINE DEVICE (IUD) REMOVAL;  Surgeon: Eldonna Mays, MD;  Location: [facility];  Service: Gynecology;  Laterality: N/A;  Labrum Repair Right 11/15/1998   Open   LAPAROSCOPIC APPENDECTOMY N/A 09/02/2013   Procedure: APPENDECTOMY LAPAROSCOPIC;  Surgeon: Vicenta DELENA Poli, MD;  Location: [facility];  Service: General;  Laterality: N/A;   ROBOTIC ASSISTED SALPINGO OOPHERECTOMY N/A 12/20/2023   Procedure: XI ROBOTIC ASSISTED LEFT SALPINGO OOPHORECTOMY RIGHT SALPINGECTOMY;  Surgeon: Eldonna Mays, MD;  Location: [facility];  Service: Gynecology;  Laterality: N/A;   SHOULDER ARTHROSCOPY Left 11/16/2003   bone spurs    SHOULDER ARTHROSCOPY Right 11/01/2017   Procedure: RIGHT SHOULDER ARTHROSCOPY WITH SUBACROMIAL DECOMPRESSION;  Surgeon: Addie Cordella Hamilton, MD;  Location: [facility];  Service: Orthopedics;  Laterality: Right;   SHOULDER ARTHROSCOPY WITH SUBACROMIAL DECOMPRESSION, ROTATOR CUFF REPAIR AND BICEP TENDON REPAIR Right 02/24/2016   Procedure: RIGHT SHOULDER ARTHROSCOPY WITH DEBRIDEMENT, BICEPS TENODESIS;  Surgeon: Hamilton Cordella Addie, MD;  Location: [facility];  Service: Orthopedics;  Laterality: Right;   SHOULDER SURGERY Right 11/15/2005   Revision and repair flap   WISDOM TOOTH EXTRACTION     WRIST ARTHROSCOPY Right 11/09/2017   Procedure: ARTHROSCOPY WRIST WITH DEBRIDEMENT;  Surgeon: Sissy Cough, MD;  Location: [facility];  Service: Orthopedics;  Laterality: Right;      Rolinda Rogue, MD 08/14/24 1500

## 2024-08-17 DIAGNOSIS — R42 Dizziness and giddiness: Secondary | ICD-10-CM | POA: Diagnosis not present

## 2024-08-17 DIAGNOSIS — R110 Nausea: Secondary | ICD-10-CM | POA: Diagnosis not present

## 2024-08-17 DIAGNOSIS — R100 Acute abdomen: Secondary | ICD-10-CM | POA: Diagnosis not present

## 2024-08-17 DIAGNOSIS — R1010 Upper abdominal pain, unspecified: Secondary | ICD-10-CM | POA: Diagnosis not present

## 2024-08-17 DIAGNOSIS — R002 Palpitations: Secondary | ICD-10-CM | POA: Diagnosis not present

## 2024-08-19 DIAGNOSIS — R001 Bradycardia, unspecified: Secondary | ICD-10-CM | POA: Diagnosis not present

## 2024-08-20 ENCOUNTER — Other Ambulatory Visit (HOSPITAL_COMMUNITY): Payer: Self-pay

## 2024-08-20 ENCOUNTER — Encounter

## 2024-08-20 ENCOUNTER — Encounter: Payer: Self-pay | Admitting: Physician Assistant

## 2024-08-20 ENCOUNTER — Telehealth: Admitting: Physician Assistant

## 2024-08-20 DIAGNOSIS — F419 Anxiety disorder, unspecified: Secondary | ICD-10-CM

## 2024-08-20 MED ORDER — FLUOXETINE HCL 10 MG PO CAPS
10.0000 mg | ORAL_CAPSULE | Freq: Every day | ORAL | 0 refills | Status: DC
  Filled 2024-08-20: qty 21, 21d supply, fill #0

## 2024-08-20 NOTE — Progress Notes (Signed)
 Virtual Visit Consent   Emma Mathews, you are scheduled for a virtual visit with a [facility] provider today. Just as with appointments in the office, your consent must be obtained to participate. Your consent will be active for this visit and any virtual visit you may have with one of our providers in the next 365 days. If you have a MyChart account, a copy of this consent can be sent to you electronically.  As this is a virtual visit, video technology does not allow for your provider to perform a traditional examination. This may limit your provider's ability to fully assess your condition. If your provider identifies any concerns that need to be evaluated in person or the need to arrange testing (such as labs, EKG, etc.), we will make arrangements to do so. Although advances in technology are sophisticated, we cannot ensure that it will always work on either your end or our end. If the connection with a video visit is poor, the visit may have to be switched to a telephone visit. With either a video or telephone visit, we are not always able to ensure that we have a secure connection.  By engaging in this virtual visit, you consent to the provision of healthcare and authorize for your insurance to be billed (if applicable) for the services provided during this visit. Depending on your insurance coverage, you may receive a charge related to this service.  I need to obtain your verbal consent now. Are you willing to proceed with your visit today? Emma Mathews has provided verbal consent on 08/20/2024 for a virtual visit (video or telephone). Laurielle Selmon, PA-C  Date: 08/20/2024 1:08 PM   Virtual Visit via Video Note   I, Emma Mathews, connected with  Emma Mathews  ([id number], [birth year]) on 08/20/24 at  9:15 AM EDT by a video-enabled telemedicine application and verified that I am speaking with the correct person using two identifiers.  Location: Patient: Virtual Visit Location  Patient: Home Provider: Virtual Visit Location Provider: Home Office   I discussed the limitations of evaluation and management by telemedicine and the availability of in person appointments. The patient expressed understanding and agreed to proceed.    History of Present Illness: Emma Mathews is a 48 y.o. who identifies as a female who was assigned female at birth, and is being seen today for anxiety.  HPI: Presents for evaluation of possible anxiety.  Patient states that she was recently positive for COVID on 9/26, was seen by her primary care, treated with hydrocodone  cough syrup and prednisone .  Patient also states that since then she has had a conference in [place], while she was there she noticed that her heart rate was racing, felt short of breath and was evaluated in the ER.  Patient reports that she was treated with fluids, oxygen and sent home on 10/3.  Overall reports that her COVID like symptoms, cough and congestion is improving.  However she continues to feel that her heart is racing but when she checks her watch or aura ring her heart rate is within the normal limits, states sensation that she is more aware of her heart.  She has taken hydroxyzine  that was given to her in the past due to acute stress, during the past few days it has been helpful.  Patient does admit to increased stress at work, increased workload, and interpersonal life.  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  Patient reports that she  has an appointment with her primary care on 10/16.  At this time she denies any chest pain, shortness of breath, nausea, vomiting, numbness and tingling. Denies SI/HI.     Problems:  Patient Active Problem List   Diagnosis Date Noted   Pelvic mass in female 12/20/2023   Encounter for general adult medical examination with abnormal findings 07/08/2023   Need for vaccination 07/08/2023   Polycythemia, secondary 05/11/2023   Obesity (BMI 30-39.9) 01/13/2023    Acquired equinus deformity of left foot 01/08/2020   Low back pain 10/26/2019   Chronic migraine with aura 03/27/2018   Headache 02/01/2018   Motion sickness 02/01/2018   Degenerative TFCC tear, right 10/25/2017   ADD (attention deficit disorder) 10/28/2009   Allergic rhinitis 02/25/2009    Allergies:  Allergies  Allergen Reactions   Diphenhydramine  Hcl Hives and Other (See Comments)    Pt states she can take Dye free Benadryl    Diphenhydramine  Hcl Other (See Comments) and Hives    Pt states she can take Dye free Benadryl    Iodinated Contrast Media Shortness Of Breath, Nausea And Vomiting, Other (See Comments) and Anaphylaxis    Shortness of breath   Chlorhexidine  Gluconate Hives   Codeine Hives, Nausea And Vomiting and Rash   Medications:  Current Outpatient Medications:    FLUoxetine  (PROZAC ) 10 MG capsule, Take 1 capsule (10 mg total) by mouth daily for 21 days., Disp: 21 capsule, Rfl: 0   estradiol  (VIVELLE -DOT) 0.025 MG/24HR, Place 1 patch onto the skin 2 (two) times a week., Disp: 8 patch, Rfl: 3   FIBER GUMMIES PO, Take 3 each by mouth daily., Disp: , Rfl:    HYDROcodone  bit-homatropine (HYCODAN) 5-1.5 MG/5ML syrup, Take 5 mLs by mouth every 6 (six) hours as needed for cough., Disp: 90 mL, Rfl: 0   hydrOXYzine  (ATARAX ) 10 MG tablet, Take 1 tablet (10 mg total) by mouth 3 (three) times daily as needed., Disp: 60 tablet, Rfl: 3   levocetirizine (XYZAL ALLERGY 24HR) 5 MG tablet, Take 5 mg by mouth every evening., Disp: , Rfl:    mometasone  (NASONEX ) 50 MCG/ACT nasal spray, Place 2 sprays into the nose daily., Disp: , Rfl:    Multiple Vitamins-Minerals (MULTIPLE VITAMINS/WOMENS PO), Take 1 tablet by mouth daily., Disp: , Rfl:    neomycin -polymyxin b-dexamethasone  (MAXITROL) 3.5-10000-0.1 SUSP, Place 2 drops into the left eye every 6 (six) hours., Disp: 5 mL, Rfl: 0   ofloxacin  (OCUFLOX ) 0.3 % ophthalmic solution, Place 1 drop into the left eye 3 (three) times daily. (Patient not  taking: Reported on 08/09/2024), Disp: 5 mL, Rfl: 0   pantoprazole  (PROTONIX ) 40 MG tablet, Take 1 tablet (40 mg total) by mouth daily. Take 30 minutes before breakfast., Disp: 90 tablet, Rfl: 1   predniSONE  (DELTASONE ) 20 MG tablet, Take 2 tablets (40 mg total) by mouth daily., Disp: 10 tablet, Rfl: 0   progesterone  (PROMETRIUM ) 200 MG capsule, Take 1 capsule (200 mg total) by mouth daily., Disp: 30 capsule, Rfl: 4   rizatriptan  (MAXALT -MLT) 10 MG disintegrating tablet, DISSOLVE 1 TABLET (10 MG TOTAL) BY MOUTH AS NEEDED. MAY REPEAT IN 2 HOURS IF NEEDED, Disp: 15 tablet, Rfl: 0   SUMAtriptan  6 MG/0.5ML SOAJ, Inject 6mg  at onset of migraine.  May repeat in 2 hrs, if needed.  Max dose: 2 injections/day. This is a 30 day prescription., Disp: 10 Cartridge, Rfl: 5   tirzepatide  (ZEPBOUND ) 5 MG/0.5ML injection vial, Inject 5 mg into the skin once a week., Disp: 2 mL,  Rfl: 3   tretinoin  (RETIN-A ) 0.025 % cream, Apply a small amount at night time to face and neck , twice a week. Increase according to tolerance up to daily, Disp: 45 g, Rfl: 2   Vibegron  (GEMTESA ) 75 MG TABS, Take 1 tablet (75 mg total) by mouth daily., Disp: 30 tablet, Rfl: 11  Observations/Objective: Patient is well-developed, well-nourished in no acute distress.  Resting comfortably  at home.  Head is normocephalic, atraumatic.  No labored breathing.  Speech is clear and coherent with logical content.  Patient is alert and oriented at baseline.    Assessment and Plan: 1. Anxiety (Primary) - FLUoxetine  (PROZAC ) 10 MG capsule; Take 1 capsule (10 mg total) by mouth daily for 21 days.  Dispense: 21 capsule; Refill: 0  Patient's current symptoms discussed extensively, possible side effect, withdrawal symptoms related to the hydrocodone  cough syrup. Also suspect increased stress, anxiety due to personal and professional stress triggers.   Patient to follow-up with her primary care in person, scheduled for 10/16  Follow Up  Instructions: I discussed the assessment and treatment plan with the patient. The patient was provided an opportunity to ask questions and all were answered. The patient agreed with the plan and demonstrated an understanding of the instructions.  A copy of instructions were sent to the patient via MyChart unless otherwise noted below.   The patient was advised to call back or seek an in-person evaluation if the symptoms worsen or if the condition fails to improve as anticipated.    Emma Nery, PA-C

## 2024-08-20 NOTE — Patient Instructions (Signed)
 Emma Mathews, thank you for joining Jacquise Rarick, PA-C for today's virtual visit.  While this provider is not your primary care provider (PCP), if your PCP is located in our provider database this encounter information will be shared with them immediately following your visit.   A [facility] MyChart account gives you access to today's visit and all your visits, tests, and labs performed at [facility]  click here if you don't have a  MyChart account or go to [email or web address]  Consent: (Patient) Emma Mathews provided verbal consent for this virtual visit at the beginning of the encounter.  Current Medications:  Current Outpatient Medications:    FLUoxetine  (PROZAC ) 10 MG capsule, Take 1 capsule (10 mg total) by mouth daily for 21 days., Disp: 21 capsule, Rfl: 0   estradiol  (VIVELLE -DOT) 0.025 MG/24HR, Place 1 patch onto the skin 2 (two) times a week., Disp: 8 patch, Rfl: 3   FIBER GUMMIES PO, Take 3 each by mouth daily., Disp: , Rfl:    HYDROcodone  bit-homatropine (HYCODAN) 5-1.5 MG/5ML syrup, Take 5 mLs by mouth every 6 (six) hours as needed for cough., Disp: 90 mL, Rfl: 0   hydrOXYzine  (ATARAX ) 10 MG tablet, Take 1 tablet (10 mg total) by mouth 3 (three) times daily as needed., Disp: 60 tablet, Rfl: 3   levocetirizine (XYZAL ALLERGY 24HR) 5 MG tablet, Take 5 mg by mouth every evening., Disp: , Rfl:    mometasone  (NASONEX ) 50 MCG/ACT nasal spray, Place 2 sprays into the nose daily., Disp: , Rfl:    Multiple Vitamins-Minerals (MULTIPLE VITAMINS/WOMENS PO), Take 1 tablet by mouth daily., Disp: , Rfl:    neomycin -polymyxin b-dexamethasone  (MAXITROL) 3.5-10000-0.1 SUSP, Place 2 drops into the left eye every 6 (six) hours., Disp: 5 mL, Rfl: 0   ofloxacin  (OCUFLOX ) 0.3 % ophthalmic solution, Place 1 drop into the left eye 3 (three) times daily. (Patient not taking: Reported on 08/09/2024), Disp: 5 mL, Rfl: 0   pantoprazole  (PROTONIX ) 40 MG tablet, Take 1  tablet (40 mg total) by mouth daily. Take 30 minutes before breakfast., Disp: 90 tablet, Rfl: 1   predniSONE  (DELTASONE ) 20 MG tablet, Take 2 tablets (40 mg total) by mouth daily., Disp: 10 tablet, Rfl: 0   progesterone  (PROMETRIUM ) 200 MG capsule, Take 1 capsule (200 mg total) by mouth daily., Disp: 30 capsule, Rfl: 4   rizatriptan  (MAXALT -MLT) 10 MG disintegrating tablet, DISSOLVE 1 TABLET (10 MG TOTAL) BY MOUTH AS NEEDED. MAY REPEAT IN 2 HOURS IF NEEDED, Disp: 15 tablet, Rfl: 0   SUMAtriptan  6 MG/0.5ML SOAJ, Inject 6mg  at onset of migraine.  May repeat in 2 hrs, if needed.  Max dose: 2 injections/day. This is a 30 day prescription., Disp: 10 Cartridge, Rfl: 5   tirzepatide  (ZEPBOUND ) 5 MG/0.5ML injection vial, Inject 5 mg into the skin once a week., Disp: 2 mL, Rfl: 3   tretinoin  (RETIN-A ) 0.025 % cream, Apply a small amount at night time to face and neck , twice a week. Increase according to tolerance up to daily, Disp: 45 g, Rfl: 2   Vibegron  (GEMTESA ) 75 MG TABS, Take 1 tablet (75 mg total) by mouth daily., Disp: 30 tablet, Rfl: 11   Medications ordered in this encounter:  Meds ordered this encounter  Medications   FLUoxetine  (PROZAC ) 10 MG capsule    Sig: Take 1 capsule (10 mg total) by mouth daily for 21 days.    Dispense:  21 capsule    Refill:  0  Supervising Provider:   BLAISE ALEENE KIDD [[id number]]     *If you need refills on other medications prior to your next appointment, please contact your pharmacy*  Follow-Up: Call back or seek an in-person evaluation if the symptoms worsen or if the condition fails to improve as anticipated.   Virtual Care [phone]  Other Instructions Please follow-up with you PCP as scheduled to go over current medications, discuss any side effects  Managing Anxiety, Adult After being diagnosed with anxiety, you may be relieved to know why you have felt or behaved a certain way. You may also feel overwhelmed about the treatment ahead  and what it will mean for your life. With care and support, you can manage your anxiety. How to manage lifestyle changes Understanding the difference between stress and anxiety Although stress can play a role in anxiety, it is not the same as anxiety. Stress is your body's reaction to life changes and events, both good and bad. Stress is often caused by something external, such as a deadline, test, or competition. It normally goes away after the event has ended and will last just a few hours. But, stress can be ongoing and can lead to more than just stress. Anxiety is caused by something internal, such as imagining a terrible outcome or worrying that something will go wrong that will greatly upset you. Anxiety often does not go away even after the event is over, and it can become a long-term (chronic) worry. Lowering stress and anxiety Talk with your health care provider or a counselor to learn more about lowering anxiety and stress. They may suggest tension-reduction techniques, such as: Music. Spend time creating or listening to music that you enjoy and that inspires you. Mindfulness-based meditation. Practice being aware of your normal breaths while not trying to control your breathing. It can be done while sitting or walking. Centering prayer. Focus on a word, phrase, or sacred image that means something to you and brings you peace. Deep breathing. Expand your stomach and inhale slowly through your nose. Hold your breath for 3-5 seconds. Then breathe out slowly, letting your stomach muscles relax. Self-talk. Learn to notice and spot thought patterns that lead to anxiety reactions. Change those patterns to thoughts that feel peaceful. Muscle relaxation. Take time to tense muscles and then relax them. Choose a tension-reduction technique that fits your lifestyle and personality. These techniques take time and practice. Set aside 5-15 minutes a day to do them. Specialized therapists can offer  counseling and training in these techniques. The training to help with anxiety may be covered by some insurance plans. Other things you can do to manage stress and anxiety include: Keeping a stress diary. This can help you learn what triggers your reaction and then learn ways to manage your response. Thinking about how you react to certain situations. You may not be able to control everything, but you can control your response. Making time for activities that help you relax and not feeling guilty about spending your time in this way. Doing visual imagery. This involves imagining or creating mental pictures to help you relax. Practicing yoga. Through yoga poses, you can lower tension and relax.  Medicines Medicines for anxiety include: Antidepressant medicines. These are usually prescribed for long-term daily control. Anti-anxiety medicines. These may be added in severe cases, especially when panic attacks occur. When used together, medicines, psychotherapy, and tension-reduction techniques may be the most effective treatment. Relationships Relationships can play a big part in helping you  recover. Spend more time connecting with trusted friends and family members. Think about going to couples counseling if you have a partner, taking family education classes, or going to family therapy. Therapy can help you and others better understand your anxiety. How to recognize changes in your anxiety Everyone responds differently to treatment for anxiety. Recovery from anxiety happens when symptoms lessen and stop interfering with your daily life at home or work. This may mean that you will start to: Have better concentration and focus. Worry will interfere less in your daily thinking. Sleep better. Be less irritable. Have more energy. Have improved memory. Try to recognize when your condition is getting worse. Contact your provider if your symptoms interfere with home or work and you feel like your  condition is not improving. Follow these instructions at home: Activity Exercise. Adults should: Exercise for at least 150 minutes each week. The exercise should increase your heart rate and make you sweat (moderate-intensity exercise). Do strengthening exercises at least twice a week. Get the right amount and quality of sleep. Most adults need 7-9 hours of sleep each night. Lifestyle  Eat a healthy diet that includes plenty of vegetables, fruits, whole grains, low-fat dairy products, and lean protein. Do not eat a lot of foods that are high in fats, added sugars, or salt (sodium). Make choices that simplify your life. Do not use any products that contain nicotine or tobacco. These products include cigarettes, chewing tobacco, and vaping devices, such as e-cigarettes. If you need help quitting, ask your provider. Avoid caffeine, alcohol, and certain over-the-counter cold medicines. These may make you feel worse. Ask your pharmacist which medicines to avoid. General instructions Take over-the-counter and prescription medicines only as told by your provider. Keep all follow-up visits. This is to make sure you are managing your anxiety well or if you need more support. Where to find support You can get help and support from: Self-help groups. Online and [employer]. A trusted spiritual leader. Couples counseling. Family education classes. Family therapy. Where to find more information You may find that joining a support group helps you deal with your anxiety. The following sources can help you find counselors or support groups near you: Mental Health America: mentalhealthamerica.net Anxiety and Depression Association of [place] (ADAA): adaa.org The [employer] on Mental Illness (NAMI): nami.org Contact a health care provider if: You have a hard time staying focused or finishing tasks. You spend many hours a day feeling worried about everyday life. You are very tired  because you cannot stop worrying. You start to have headaches or often feel tense. You have chronic nausea or diarrhea. Get help right away if: Your heart feels like it is racing. You have shortness of breath. You have thoughts of hurting yourself or others. Get help right away if you feel like you may hurt yourself or others, or have thoughts about taking your own life. Go to your nearest emergency room or: Call 911. Call the National Suicide Prevention Lifeline at (719)228-1589 or 988. This is open 24 hours a day. Text the Crisis Text Line at (213)441-7711. This information is not intended to replace advice given to you by your health care provider. Make sure you discuss any questions you have with your health care provider. Document Revised: 08/10/2022 Document Reviewed: 02/22/2021 Elsevier Patient Education  2024 Elsevier Inc.  If you have been instructed to have an in-person evaluation today at a local Urgent Care facility, please use the link below. It will take you to a  list of all of our available [facility], including address, phone number and hours of operation. Please do not delay care.  [facility]  If you or a family member do not have a primary care provider, use the link below to schedule a visit and establish care. When you choose a [facility] primary care physician or advanced practice provider, you gain a long-term partner in health. Find a Primary Care Provider  Learn more about [facility]'s in-office and virtual care options:  - Get Care Now

## 2024-08-22 ENCOUNTER — Ambulatory Visit (INDEPENDENT_AMBULATORY_CARE_PROVIDER_SITE_OTHER): Admitting: Orthopedic Surgery

## 2024-08-22 ENCOUNTER — Encounter: Payer: Self-pay | Admitting: Orthopedic Surgery

## 2024-08-22 ENCOUNTER — Other Ambulatory Visit (HOSPITAL_COMMUNITY): Payer: Self-pay

## 2024-08-22 DIAGNOSIS — M1711 Unilateral primary osteoarthritis, right knee: Secondary | ICD-10-CM

## 2024-08-22 DIAGNOSIS — M65961 Unspecified synovitis and tenosynovitis, right lower leg: Secondary | ICD-10-CM

## 2024-08-22 MED ORDER — HYALURONAN 88 MG/4ML IX SOSY
88.0000 mg | PREFILLED_SYRINGE | INTRA_ARTICULAR | Status: AC | PRN
  Administered 2024-08-22: 88 mg via INTRA_ARTICULAR

## 2024-08-22 MED ORDER — LIDOCAINE HCL 1 % IJ SOLN
5.0000 mL | INTRAMUSCULAR | Status: AC | PRN
  Administered 2024-08-22: 5 mL

## 2024-08-22 NOTE — Progress Notes (Signed)
   Procedure Note  Patient: Emma Mathews             Date of Birth: Jan 26, 1976           MRN: [id number]             Visit Date: 08/22/2024  Procedures: Visit Diagnoses:  1. Synovitis of right knee     Large Joint Inj: R knee on 08/22/2024 7:40 PM Indications: pain, joint swelling and diagnostic evaluation Details: 18 G 1.5 in needle, superolateral approach  Arthrogram: No  Medications: 5 mL lidocaine  1 %; 88 mg Hyaluronan 88 MG/4ML Outcome: tolerated well, no immediate complications Procedure, treatment alternatives, risks and benefits explained, specific risks discussed. Consent was given by the patient. Immediately prior to procedure a time out was called to verify the correct patient, procedure, equipment, support staff and site/side marked as required. Patient was prepped and draped in the usual sterile fashion.     Lot #[id number] Patient traveling to [place] at the end of November.  She may come in for repeat clinical evaluation just before that visit.  Currently she has full range of motion and no effusion in that right knee.

## 2024-08-23 ENCOUNTER — Other Ambulatory Visit (HOSPITAL_COMMUNITY): Payer: Self-pay

## 2024-08-23 ENCOUNTER — Other Ambulatory Visit: Payer: Self-pay

## 2024-08-23 MED ORDER — PROGESTERONE 200 MG PO CAPS
200.0000 mg | ORAL_CAPSULE | Freq: Every day | ORAL | 4 refills | Status: AC
  Filled 2024-08-23: qty 30, 30d supply, fill #0
  Filled 2024-09-25: qty 30, 30d supply, fill #1
  Filled 2024-10-23: qty 30, 30d supply, fill #2
  Filled 2024-11-13 – 2024-11-16 (×2): qty 30, 30d supply, fill #3

## 2024-08-30 ENCOUNTER — Ambulatory Visit: Admitting: Nurse Practitioner

## 2024-09-03 ENCOUNTER — Encounter: Payer: Self-pay | Admitting: Nurse Practitioner

## 2024-09-07 ENCOUNTER — Other Ambulatory Visit: Payer: Self-pay

## 2024-09-07 ENCOUNTER — Other Ambulatory Visit (HOSPITAL_COMMUNITY): Payer: Self-pay

## 2024-09-07 DIAGNOSIS — F419 Anxiety disorder, unspecified: Secondary | ICD-10-CM

## 2024-09-07 MED ORDER — FLUOXETINE HCL 10 MG PO CAPS: 10.0000 mg | ORAL_CAPSULE | Freq: Every day | ORAL | 0 refills | Status: DC

## 2024-09-07 MED ORDER — FLUOXETINE HCL 10 MG PO CAPS
10.0000 mg | ORAL_CAPSULE | Freq: Every day | ORAL | 0 refills | Status: DC
  Filled 2024-09-07 – 2024-09-25 (×5): qty 21, 21d supply, fill #0

## 2024-09-11 ENCOUNTER — Other Ambulatory Visit (HOSPITAL_COMMUNITY): Payer: Self-pay

## 2024-09-12 ENCOUNTER — Other Ambulatory Visit (HOSPITAL_COMMUNITY): Payer: Self-pay

## 2024-09-17 ENCOUNTER — Encounter: Payer: Self-pay | Admitting: Radiology

## 2024-09-19 ENCOUNTER — Other Ambulatory Visit: Payer: Self-pay | Admitting: Medical Genetics

## 2024-09-19 DIAGNOSIS — Z006 Encounter for examination for normal comparison and control in clinical research program: Secondary | ICD-10-CM

## 2024-09-25 ENCOUNTER — Other Ambulatory Visit (HOSPITAL_COMMUNITY): Payer: Self-pay

## 2024-09-25 ENCOUNTER — Ambulatory Visit (INDEPENDENT_AMBULATORY_CARE_PROVIDER_SITE_OTHER): Payer: Self-pay | Admitting: Plastic Surgery

## 2024-09-25 ENCOUNTER — Other Ambulatory Visit: Payer: Self-pay

## 2024-09-25 VITALS — BP 118/65 | HR 66 | Ht 67.0 in | Wt 149.0 lb

## 2024-09-25 DIAGNOSIS — Z719 Counseling, unspecified: Secondary | ICD-10-CM

## 2024-09-25 MED ORDER — LIDOCAINE 23% - TETRACAINE 7% TOPICAL OINTMENT (PLASTICIZED)
1.0000 | TOPICAL_OINTMENT | Freq: Once | CUTANEOUS | 0 refills | Status: AC
  Filled 2024-09-25: qty 60, 1d supply, fill #0

## 2024-09-25 NOTE — Progress Notes (Signed)
 Patient ID: Emma Mathews, female    DOB: 1976/07/29, 48 y.o.   MRN: [id number]   Chief Complaint  Patient presents with   consult    Halo    The patient is a 48 year old female here for evaluation of her face.  She is going through menopause but she is otherwise in good health.  She is likely a Fitzpatrick 3.  She has a lot of sun exposure history.  She would like to work on her overall skin health.  She does use Retin-A .    Review of Systems  Constitutional: Negative.   Eyes: Negative.   Respiratory: Negative.    Cardiovascular: Negative.   Gastrointestinal: Negative.   Endocrine: Negative.   Genitourinary: Negative.   Musculoskeletal: Negative.     Past Medical History:  Diagnosis Date   Anemia    Anxiety    Appendicitis, acute 10/02/2013   Cancer (HCC)    melanoma excision in 2013   Cholecystitis    Complication of anesthesia    slow to wake up   Depression    Encounter for lipid screening for cardiovascular disease 01/13/2023   Headache    migraine   History of kidney stones 03/2023   had lithotripsy   Mammographic breast lesion 06/28/2017   S/p biopsy, benign    Plantar fasciitis, left 06/05/2019   Pneumonia 2010   Polycythemia, secondary 05/11/2023   PONV (postoperative nausea and vomiting)    Seasonal allergies    STRESS FRACTURE, FOOT 04/08/2009   Qualifier: Diagnosis of  By: Mavis MD, Norleen BRAVO    Vision abnormalities    s/p Lasik    Past Surgical History:  Procedure Laterality Date   CHOLECYSTECTOMY     EXTRACORPOREAL SHOCK WAVE LITHOTRIPSY Right 04/18/2023   Procedure: RIGHT EXTRACORPOREAL SHOCK WAVE LITHOTRIPSY (ESWL);  Surgeon: Selma Donnice SAUNDERS, MD;  Location: [facility];  Service: Urology;  Laterality: Right;   EYE SURGERY Bilateral    PRK   IUD REMOVAL N/A 12/20/2023   Procedure: INTRAUTERINE DEVICE (IUD) REMOVAL;  Surgeon: Eldonna Mays, MD;  Location: [facility];  Service: Gynecology;  Laterality: N/A;   Labrum Repair  Right 11/15/1998   Open   LAPAROSCOPIC APPENDECTOMY N/A 09/02/2013   Procedure: APPENDECTOMY LAPAROSCOPIC;  Surgeon: Vicenta DELENA Poli, MD;  Location: [facility];  Service: General;  Laterality: N/A;   ROBOTIC ASSISTED SALPINGO OOPHERECTOMY N/A 12/20/2023   Procedure: XI ROBOTIC ASSISTED LEFT SALPINGO OOPHORECTOMY RIGHT SALPINGECTOMY;  Surgeon: Eldonna Mays, MD;  Location: [facility];  Service: Gynecology;  Laterality: N/A;   SHOULDER ARTHROSCOPY Left 11/16/2003   bone spurs    SHOULDER ARTHROSCOPY Right 11/01/2017   Procedure: RIGHT SHOULDER ARTHROSCOPY WITH SUBACROMIAL DECOMPRESSION;  Surgeon: Addie Cordella Hamilton, MD;  Location: [facility];  Service: Orthopedics;  Laterality: Right;   SHOULDER ARTHROSCOPY WITH SUBACROMIAL DECOMPRESSION, ROTATOR CUFF REPAIR AND BICEP TENDON REPAIR Right 02/24/2016   Procedure: RIGHT SHOULDER ARTHROSCOPY WITH DEBRIDEMENT, BICEPS TENODESIS;  Surgeon: Hamilton Cordella Addie, MD;  Location: [facility];  Service: Orthopedics;  Laterality: Right;   SHOULDER SURGERY Right 11/15/2005   Revision and repair flap   WISDOM TOOTH EXTRACTION     WRIST ARTHROSCOPY Right 11/09/2017   Procedure: ARTHROSCOPY WRIST WITH DEBRIDEMENT;  Surgeon: Sissy Donnice, MD;  Location: [facility];  Service: Orthopedics;  Laterality: Right;      Current Outpatient Medications:    estradiol  (VIVELLE -DOT) 0.025 MG/24HR, Place 1 patch onto the skin 2 (two) times a week., Disp:  8 patch, Rfl: 3   FIBER GUMMIES PO, Take 3 each by mouth daily., Disp: , Rfl:    FLUoxetine  (PROZAC ) 10 MG capsule, Take 1 capsule (10 mg total) by mouth daily for 21 days., Disp: 21 capsule, Rfl: 0   hydrOXYzine  (ATARAX ) 10 MG tablet, Take 1 tablet (10 mg total) by mouth 3 (three) times daily as needed., Disp: 60 tablet, Rfl: 3   levocetirizine (XYZAL ALLERGY 24HR) 5 MG tablet, Take 5 mg by mouth every evening., Disp: , Rfl:    mometasone  (NASONEX ) 50 MCG/ACT nasal spray, Place 2 sprays into the nose daily., Disp: ,  Rfl:    Multiple Vitamins-Minerals (MULTIPLE VITAMINS/WOMENS PO), Take 1 tablet by mouth daily., Disp: , Rfl:    pantoprazole  (PROTONIX ) 40 MG tablet, Take 1 tablet (40 mg total) by mouth daily. Take 30 minutes before breakfast., Disp: 90 tablet, Rfl: 1   predniSONE  (DELTASONE ) 20 MG tablet, Take 2 tablets (40 mg total) by mouth daily., Disp: 10 tablet, Rfl: 0   progesterone  (PROMETRIUM ) 200 MG capsule, Take 1 capsule (200 mg total) by mouth daily., Disp: 30 capsule, Rfl: 4   rizatriptan  (MAXALT -MLT) 10 MG disintegrating tablet, DISSOLVE 1 TABLET (10 MG TOTAL) BY MOUTH AS NEEDED. MAY REPEAT IN 2 HOURS IF NEEDED, Disp: 15 tablet, Rfl: 0   SUMAtriptan  6 MG/0.5ML SOAJ, Inject 6mg  at onset of migraine.  May repeat in 2 hrs, if needed.  Max dose: 2 injections/day. This is a 30 day prescription., Disp: 10 Cartridge, Rfl: 5   tirzepatide  (ZEPBOUND ) 5 MG/0.5ML injection vial, Inject 5 mg into the skin once a week., Disp: 2 mL, Rfl: 3   tretinoin  (RETIN-A ) 0.025 % cream, Apply a small amount at night time to face and neck , twice a week. Increase according to tolerance up to daily, Disp: 45 g, Rfl: 2   Vibegron  (GEMTESA ) 75 MG TABS, Take 1 tablet (75 mg total) by mouth daily., Disp: 30 tablet, Rfl: 11   Objective:   Vitals:   09/25/24 1421  BP: 118/65  Pulse: 66  SpO2: 100%    Physical Exam Vitals reviewed.  Constitutional:      Appearance: Normal appearance.  Cardiovascular:     Rate and Rhythm: Normal rate.  Skin:    General: Skin is warm.     Capillary Refill: Capillary refill takes less than 2 seconds.  Neurological:     Mental Status: She is alert and oriented to person, place, and time.  Psychiatric:        Mood and Affect: Mood normal.        Behavior: Behavior normal.        Thought Content: Thought content normal.        Judgment: Judgment normal.     Assessment & Plan:  No diagnosis found.  The patient is a great candidate for the BBL and the halo.  She is going to look at  timing.  I will go ahead and send in her numbing cream.  We will need to get pictures at her next visit.  Emma RAMAN Skye Rodarte, DO

## 2024-10-01 ENCOUNTER — Other Ambulatory Visit (HOSPITAL_COMMUNITY): Payer: Self-pay

## 2024-10-02 ENCOUNTER — Other Ambulatory Visit: Payer: Self-pay | Admitting: Plastic Surgery

## 2024-10-02 ENCOUNTER — Other Ambulatory Visit (HOSPITAL_COMMUNITY): Payer: Self-pay

## 2024-10-02 MED ORDER — LIDOCAINE 23% - TETRACAINE 7% TOPICAL OINTMENT (PLASTICIZED)
1.0000 | TOPICAL_OINTMENT | Freq: Once | CUTANEOUS | 0 refills | Status: AC
  Filled 2024-10-02: qty 60, 1d supply, fill #0

## 2024-10-05 ENCOUNTER — Other Ambulatory Visit (HOSPITAL_COMMUNITY): Payer: Self-pay

## 2024-10-17 ENCOUNTER — Other Ambulatory Visit: Payer: Self-pay | Admitting: Nurse Practitioner

## 2024-10-17 ENCOUNTER — Other Ambulatory Visit (HOSPITAL_COMMUNITY): Payer: Self-pay

## 2024-10-17 ENCOUNTER — Other Ambulatory Visit: Payer: Self-pay

## 2024-10-17 DIAGNOSIS — F419 Anxiety disorder, unspecified: Secondary | ICD-10-CM

## 2024-10-18 ENCOUNTER — Other Ambulatory Visit (HOSPITAL_COMMUNITY): Payer: Self-pay

## 2024-10-18 ENCOUNTER — Other Ambulatory Visit: Payer: Self-pay

## 2024-10-18 MED ORDER — FLUOXETINE HCL 10 MG PO CAPS
10.0000 mg | ORAL_CAPSULE | Freq: Every day | ORAL | 0 refills | Status: DC
  Filled 2024-10-18: qty 90, 90d supply, fill #0

## 2024-10-18 MED ORDER — ESTRADIOL 0.025 MG/24HR TD PTTW
1.0000 | MEDICATED_PATCH | TRANSDERMAL | 3 refills | Status: AC
  Filled 2024-10-18: qty 8, 28d supply, fill #0
  Filled 2024-11-13: qty 8, 28d supply, fill #1

## 2024-10-19 ENCOUNTER — Other Ambulatory Visit: Payer: Self-pay | Admitting: Nurse Practitioner

## 2024-10-19 DIAGNOSIS — E669 Obesity, unspecified: Secondary | ICD-10-CM

## 2024-10-29 DIAGNOSIS — H5213 Myopia, bilateral: Secondary | ICD-10-CM | POA: Diagnosis not present

## 2024-10-29 DIAGNOSIS — H52203 Unspecified astigmatism, bilateral: Secondary | ICD-10-CM | POA: Diagnosis not present

## 2024-10-30 ENCOUNTER — Telehealth: Payer: Self-pay | Admitting: Plastic Surgery

## 2024-10-30 ENCOUNTER — Other Ambulatory Visit: Payer: Self-pay | Admitting: Plastic Surgery

## 2024-10-30 ENCOUNTER — Other Ambulatory Visit (HOSPITAL_COMMUNITY): Payer: Self-pay

## 2024-10-30 MED ORDER — LIDOCAINE 23% - TETRACAINE 7% TOPICAL OINTMENT (PLASTICIZED)
1.0000 | TOPICAL_OINTMENT | Freq: Once | CUTANEOUS | 0 refills | Status: AC
  Filled 2024-10-30: qty 60, 1d supply, fill #0

## 2024-10-30 NOTE — Progress Notes (Signed)
-   Lidocaine cream

## 2024-10-30 NOTE — Telephone Encounter (Signed)
 Pt called and stated that [facility] pharmacy does not have her numbing cream script for her halo appointment tomorrow, please advise.

## 2024-10-31 ENCOUNTER — Ambulatory Visit: Payer: Self-pay | Admitting: Plastic Surgery

## 2024-10-31 ENCOUNTER — Other Ambulatory Visit (HOSPITAL_COMMUNITY): Payer: Self-pay

## 2024-10-31 ENCOUNTER — Encounter: Payer: Self-pay | Admitting: Plastic Surgery

## 2024-10-31 DIAGNOSIS — Z719 Counseling, unspecified: Secondary | ICD-10-CM

## 2024-10-31 NOTE — Progress Notes (Signed)
 HALO Treatment   Treatment  Settings:       In Media    Topical and/or Block: Cream  Post Care: instructions given

## 2024-11-13 ENCOUNTER — Other Ambulatory Visit (HOSPITAL_COMMUNITY): Payer: Self-pay

## 2024-11-14 ENCOUNTER — Other Ambulatory Visit (HOSPITAL_COMMUNITY): Payer: Self-pay

## 2024-11-14 ENCOUNTER — Other Ambulatory Visit: Payer: Self-pay

## 2024-11-16 ENCOUNTER — Other Ambulatory Visit: Payer: Self-pay

## 2024-11-16 ENCOUNTER — Other Ambulatory Visit (HOSPITAL_COMMUNITY): Payer: Self-pay

## 2024-11-21 ENCOUNTER — Other Ambulatory Visit: Payer: Self-pay

## 2024-11-22 ENCOUNTER — Other Ambulatory Visit (HOSPITAL_COMMUNITY): Payer: Self-pay

## 2024-11-22 ENCOUNTER — Ambulatory Visit: Admitting: Nurse Practitioner

## 2024-11-22 ENCOUNTER — Encounter (HOSPITAL_COMMUNITY): Payer: Self-pay

## 2024-11-22 DIAGNOSIS — E669 Obesity, unspecified: Secondary | ICD-10-CM

## 2024-11-22 DIAGNOSIS — F419 Anxiety disorder, unspecified: Secondary | ICD-10-CM | POA: Insufficient documentation

## 2024-11-22 DIAGNOSIS — Z6823 Body mass index (BMI) 23.0-23.9, adult: Secondary | ICD-10-CM

## 2024-11-22 MED ORDER — TIRZEPATIDE-WEIGHT MANAGEMENT 2.5 MG/0.5ML ~~LOC~~ SOLN: 2.5000 mg | SUBCUTANEOUS | 3 refills | Status: DC

## 2024-11-22 MED ORDER — FLUOXETINE HCL 20 MG PO CAPS
20.0000 mg | ORAL_CAPSULE | Freq: Every day | ORAL | 1 refills | Status: AC
  Filled 2024-11-22: qty 30, 30d supply, fill #0

## 2024-11-22 MED ORDER — TIRZEPATIDE-WEIGHT MANAGEMENT 2.5 MG/0.5ML ~~LOC~~ SOLN: 2.5000 mg | SUBCUTANEOUS | 3 refills | Status: AC

## 2024-11-22 MED ORDER — TRAZODONE HCL 50 MG PO TABS
50.0000 mg | ORAL_TABLET | Freq: Every day | ORAL | 0 refills | Status: DC
  Filled 2024-11-22: qty 30, 30d supply, fill #0

## 2024-11-22 NOTE — Assessment & Plan Note (Signed)
" °  History of obesity, status post weight loss with anti-obesity pharmacotherapy Significant weight loss achieved. Current weight 147 lbs, BMI 23. Regular physical activity maintained. - Reduced tirzepatide  to 2.5 mg every other week. - Continue regular physical activity regimen. "

## 2024-11-22 NOTE — Progress Notes (Signed)
 "  Established Patient Office Visit  Subjective   Patient ID: Emma Mathews, female    DOB: 12-Jun-1976  Age: 49 y.o. MRN: [id number]  Chief Complaint  Patient presents with   Medical Management of Chronic Issues    Follow up on prozac  ( talk about in general), and also continues of the Zebound ( if there need to be any changes of how she needs to take)    Discussed the use of AI scribe software for clinical note transcription with the patient, who gave verbal consent to proceed.  History of Present Illness Emma Mathews is a 49 year old female who presents for follow-up regarding her anxiety and medication management.  Anxiety and panic symptoms - Anxiety has been progressively worsening over the last few years. Has been complicated by death in the family, onset of menopause, and work stress.  - Had a severe episode while visiting [place] for a conference. Was seen in ER at that time and has since started on Prozac  10mg /day and also takes hydroxyzine  10mg  TID PRN. Has noticed hydroxyzine  is associated with restless legs to the point that it impacts her bed partner's sleep.  - Overall feels improvement in mood since starting Prozac , but continues to deal with insomnia and some anger/irritability. Denies any noticeable side effects.  - No SI/HI  Obesity - Starting weight 12/2022 234# with BMI of 38.06 - Current weight 147# with BMI of 23.06 - Zepbound  5 mg administered every two weeks for weight management      Review of Systems  Respiratory:  Negative for cough.   Cardiovascular:  Negative for chest pain.  Psychiatric/Behavioral:  Negative for suicidal ideas. The patient is nervous/anxious and has insomnia.       Objective:     BP 114/74   Pulse 84   Temp 97.7 F (36.5 C) (Temporal)   Ht 5' 7 (1.702 m)   Wt 147 lb 4 oz (66.8 kg)   SpO2 96%   BMI 23.06 kg/m  BP Readings from Last 3 Encounters:  11/22/24 114/74  09/25/24 118/65  06/18/24 123/68   Wt  Readings from Last 3 Encounters:  11/22/24 147 lb 4 oz (66.8 kg)  09/25/24 149 lb (67.6 kg)  06/18/24 151 lb 14.4 oz (68.9 kg)      Physical Exam Vitals reviewed.  Constitutional:      General: She is not in acute distress.    Appearance: Normal appearance.  HENT:     Head: Normocephalic and atraumatic.  Cardiovascular:     Rate and Rhythm: Normal rate and regular rhythm.     Pulses: Normal pulses.     Heart sounds: Normal heart sounds.  Pulmonary:     Effort: Pulmonary effort is normal.     Breath sounds: Normal breath sounds.  Skin:    General: Skin is warm and dry.  Neurological:     General: No focal deficit present.     Mental Status: She is alert and oriented to person, place, and time.  Psychiatric:        Mood and Affect: Mood normal.        Behavior: Behavior normal.        Judgment: Judgment normal.         11/22/2024    8:40 AM 01/27/2024    8:28 AM 07/08/2023    8:08 AM  PHQ9 SCORE ONLY  PHQ-9 Total Score 5 0  0      Data saved with a  previous flowsheet row definition     No results found for any visits on 11/22/24.    The 10-year ASCVD risk score (Arnett DK, et al., 2019) is: 1.1%    Assessment & Plan:   Problem List Items Addressed This Visit       Other   Obesity (BMI 30-39.9)   Relevant Medications   tirzepatide  (ZEPBOUND ) 2.5 MG/0.5ML injection vial   Anxiety   Relevant Medications   FLUoxetine  (PROZAC ) 20 MG capsule   traZODone  (DESYREL ) 50 MG tablet    Assessment and Plan Assessment & Plan Generalized anxiety disorder Exacerbation due to stressors. Improved anger and irritability on low-dose Prozac . Hydroxyzine  causes sedation. - Increased Prozac  to 20 mg daily. - Monitor for side effects of increased Prozac  dose. - Consider alternative medications/dosage adjustment if side effects occur or no improvement after six weeks. - Trial trazodone  50-100mg  at bedtime as needed for insomnia as opposed to hydroxyzine  to prevent restless  leg but still help manage comorbid insomnia - Discussed risk of serotonin syndrome and to monitor for symptoms  Insomnia Recent onset possibly due to stress and anxiety. Hydroxyzine  effective but causes leg movements. Discussed trazodone  use with caution for serotonin syndrome. - Prescribed trazodone  50-100 mg at bedtime as needed for sleep. - Educated on signs of serotonin syndrome - Monitor sleep quality and adjust treatment as needed.  History of obesity, status post weight loss with anti-obesity pharmacotherapy Significant weight loss achieved. Current weight 147 lbs, BMI 23. Regular physical activity maintained. - Reduced tirzepatide  to 2.5 mg every other week. - Continue regular physical activity regimen.    Return in about 6 weeks (around 01/03/2025) for F/U with Lauraine.    Lauraine FORBES Pereyra, NP  "

## 2024-11-22 NOTE — Addendum Note (Signed)
 Addended by: ELNOR LAURAINE BRAVO on: 11/22/2024 09:49 AM   Modules accepted: Orders

## 2024-11-22 NOTE — Assessment & Plan Note (Signed)
 Generalized anxiety disorder Exacerbation due to stressors. Improved anger and irritability on low-dose Prozac . Hydroxyzine  causes sedation. - Increased Prozac  to 20 mg daily. - Monitor for side effects of increased Prozac  dose. - Consider alternative medications/dosage adjustment if side effects occur or no improvement after six weeks. - Trial trazodone  50-100mg  at bedtime as needed for insomnia as opposed to hydroxyzine  to prevent restless leg but still help manage comorbid insomnia - Discussed risk of serotonin syndrome and to monitor for symptoms  Insomnia Recent onset possibly due to stress and anxiety. Hydroxyzine  effective but causes leg movements. Discussed trazodone  use with caution for serotonin syndrome. - Prescribed trazodone  50-100 mg at bedtime as needed for sleep. - Educated on signs of serotonin syndrome - Monitor sleep quality and adjust treatment as needed.

## 2024-11-26 ENCOUNTER — Other Ambulatory Visit (HOSPITAL_COMMUNITY): Payer: Self-pay

## 2024-11-28 ENCOUNTER — Other Ambulatory Visit (HOSPITAL_COMMUNITY): Payer: Self-pay

## 2024-11-28 MED ORDER — PROGESTERONE 200 MG PO CAPS
200.0000 mg | ORAL_CAPSULE | Freq: Every evening | ORAL | 4 refills | Status: AC
  Filled 2024-11-28: qty 90, 90d supply, fill #0

## 2024-11-28 MED ORDER — ESTRADIOL 0.0375 MG/24HR TD PTTW
1.0000 | MEDICATED_PATCH | TRANSDERMAL | 4 refills | Status: AC
  Filled 2024-11-28: qty 16, 56d supply, fill #0

## 2024-11-29 ENCOUNTER — Other Ambulatory Visit: Payer: Self-pay

## 2024-11-29 ENCOUNTER — Other Ambulatory Visit (HOSPITAL_COMMUNITY): Payer: Self-pay

## 2025-01-03 ENCOUNTER — Encounter: Admitting: Nurse Practitioner
